# Patient Record
Sex: Male | Born: 1965 | Race: Black or African American | Hispanic: No | Marital: Single | Smoking: Current every day smoker
Health system: Southern US, Community
[De-identification: ages and names within clinical notes are randomized; demographics above are authoritative.]

## PROBLEM LIST (undated history)

## (undated) DIAGNOSIS — M79673 Pain in unspecified foot: Secondary | ICD-10-CM

## (undated) HISTORY — PX: HERNIA REPAIR: SHX51

---

## 2006-03-10 ENCOUNTER — Ambulatory Visit (HOSPITAL_COMMUNITY): Admission: RE | Admit: 2006-03-10 | Discharge: 2006-03-10 | Payer: Self-pay | Admitting: Ophthalmology

## 2008-06-18 ENCOUNTER — Emergency Department (HOSPITAL_COMMUNITY): Admission: EM | Admit: 2008-06-18 | Discharge: 2008-06-18 | Payer: Self-pay | Admitting: Emergency Medicine

## 2008-06-19 ENCOUNTER — Emergency Department (HOSPITAL_COMMUNITY): Admission: EM | Admit: 2008-06-19 | Discharge: 2008-06-20 | Payer: Self-pay | Admitting: Emergency Medicine

## 2010-01-08 ENCOUNTER — Emergency Department (HOSPITAL_COMMUNITY): Admission: EM | Admit: 2010-01-08 | Discharge: 2010-01-09 | Payer: Self-pay | Admitting: Emergency Medicine

## 2010-01-12 ENCOUNTER — Emergency Department (HOSPITAL_COMMUNITY): Admission: EM | Admit: 2010-01-12 | Discharge: 2010-01-12 | Payer: Self-pay | Admitting: Emergency Medicine

## 2010-01-15 ENCOUNTER — Emergency Department (HOSPITAL_COMMUNITY): Admission: EM | Admit: 2010-01-15 | Discharge: 2010-01-15 | Payer: Self-pay | Admitting: Emergency Medicine

## 2010-01-31 ENCOUNTER — Emergency Department (HOSPITAL_COMMUNITY): Admission: EM | Admit: 2010-01-31 | Discharge: 2010-02-01 | Payer: Self-pay | Admitting: Emergency Medicine

## 2010-02-05 ENCOUNTER — Ambulatory Visit: Payer: Self-pay | Admitting: Vascular Surgery

## 2010-02-05 ENCOUNTER — Observation Stay (HOSPITAL_COMMUNITY): Admission: EM | Admit: 2010-02-05 | Discharge: 2010-02-05 | Payer: Self-pay | Admitting: Internal Medicine

## 2010-02-05 ENCOUNTER — Encounter (INDEPENDENT_AMBULATORY_CARE_PROVIDER_SITE_OTHER): Payer: Self-pay | Admitting: Emergency Medicine

## 2010-02-23 ENCOUNTER — Emergency Department (HOSPITAL_COMMUNITY): Admission: EM | Admit: 2010-02-23 | Discharge: 2010-02-24 | Payer: Self-pay | Admitting: Emergency Medicine

## 2010-03-25 ENCOUNTER — Emergency Department (HOSPITAL_COMMUNITY): Admission: EM | Admit: 2010-03-25 | Discharge: 2010-03-25 | Payer: Self-pay | Admitting: Emergency Medicine

## 2010-04-15 ENCOUNTER — Emergency Department (HOSPITAL_COMMUNITY): Admission: EM | Admit: 2010-04-15 | Discharge: 2010-04-15 | Payer: Self-pay | Admitting: Emergency Medicine

## 2010-08-19 ENCOUNTER — Emergency Department (HOSPITAL_COMMUNITY): Admission: EM | Admit: 2010-08-19 | Discharge: 2010-08-19 | Payer: Self-pay | Admitting: Emergency Medicine

## 2010-08-24 ENCOUNTER — Emergency Department (HOSPITAL_COMMUNITY): Admission: EM | Admit: 2010-08-24 | Discharge: 2010-08-25 | Payer: Self-pay | Admitting: Emergency Medicine

## 2010-09-03 ENCOUNTER — Emergency Department (HOSPITAL_COMMUNITY): Admission: EM | Admit: 2010-09-03 | Discharge: 2010-09-03 | Payer: Self-pay | Admitting: Emergency Medicine

## 2010-09-15 ENCOUNTER — Emergency Department (HOSPITAL_COMMUNITY): Admission: EM | Admit: 2010-09-15 | Discharge: 2010-09-16 | Payer: Self-pay | Admitting: Emergency Medicine

## 2010-09-19 ENCOUNTER — Emergency Department (HOSPITAL_COMMUNITY): Admission: EM | Admit: 2010-09-19 | Discharge: 2010-09-19 | Payer: Self-pay | Admitting: Emergency Medicine

## 2010-10-29 ENCOUNTER — Emergency Department (HOSPITAL_COMMUNITY): Admission: EM | Admit: 2010-10-29 | Discharge: 2010-03-31 | Payer: Self-pay | Admitting: Emergency Medicine

## 2010-12-08 ENCOUNTER — Emergency Department (HOSPITAL_COMMUNITY)
Admission: EM | Admit: 2010-12-08 | Discharge: 2010-12-09 | Payer: Self-pay | Source: Home / Self Care | Admitting: Emergency Medicine

## 2010-12-22 ENCOUNTER — Emergency Department (HOSPITAL_COMMUNITY)
Admission: EM | Admit: 2010-12-22 | Discharge: 2010-12-22 | Payer: Self-pay | Source: Home / Self Care | Admitting: Emergency Medicine

## 2011-01-02 ENCOUNTER — Emergency Department (HOSPITAL_COMMUNITY)
Admission: EM | Admit: 2011-01-02 | Discharge: 2011-01-02 | Disposition: A | Discharge status: Home / Self Care | Payer: Self-pay | Attending: Emergency Medicine | Admitting: Emergency Medicine

## 2011-01-02 DIAGNOSIS — Z5900 Homelessness unspecified: Secondary | ICD-10-CM | POA: Insufficient documentation

## 2011-01-02 DIAGNOSIS — R51 Headache: Secondary | ICD-10-CM | POA: Insufficient documentation

## 2011-01-02 DIAGNOSIS — M79609 Pain in unspecified limb: Secondary | ICD-10-CM | POA: Insufficient documentation

## 2011-01-02 DIAGNOSIS — R234 Changes in skin texture: Secondary | ICD-10-CM | POA: Insufficient documentation

## 2011-01-21 ENCOUNTER — Emergency Department (HOSPITAL_COMMUNITY)
Admission: EM | Admit: 2011-01-21 | Discharge: 2011-01-22 | Disposition: A | Discharge status: Home / Self Care | Payer: Self-pay | Attending: Emergency Medicine | Admitting: Emergency Medicine

## 2011-01-21 DIAGNOSIS — M545 Low back pain, unspecified: Secondary | ICD-10-CM | POA: Insufficient documentation

## 2011-01-21 DIAGNOSIS — M79609 Pain in unspecified limb: Secondary | ICD-10-CM | POA: Insufficient documentation

## 2011-01-21 DIAGNOSIS — G8929 Other chronic pain: Secondary | ICD-10-CM | POA: Insufficient documentation

## 2011-01-21 DIAGNOSIS — M25579 Pain in unspecified ankle and joints of unspecified foot: Secondary | ICD-10-CM | POA: Insufficient documentation

## 2011-01-21 DIAGNOSIS — M25569 Pain in unspecified knee: Secondary | ICD-10-CM | POA: Insufficient documentation

## 2011-01-26 ENCOUNTER — Emergency Department (HOSPITAL_COMMUNITY)
Admission: EM | Admit: 2011-01-26 | Discharge: 2011-01-26 | Disposition: A | Discharge status: Home / Self Care | Payer: Self-pay | Attending: Emergency Medicine | Admitting: Emergency Medicine

## 2011-01-26 DIAGNOSIS — Z5900 Homelessness unspecified: Secondary | ICD-10-CM | POA: Insufficient documentation

## 2011-01-26 DIAGNOSIS — M25569 Pain in unspecified knee: Secondary | ICD-10-CM | POA: Insufficient documentation

## 2011-02-04 LAB — BASIC METABOLIC PANEL
BUN: 18 mg/dL (ref 6–23)
CO2: 26 mEq/L (ref 19–32)
Calcium: 8.7 mg/dL (ref 8.4–10.5)
GFR calc non Af Amer: >60 mL/min (ref >60)
Potassium: 3.4 mEq/L — ABNORMAL LOW (ref 3.5–5.1)

## 2011-02-05 ENCOUNTER — Emergency Department (HOSPITAL_COMMUNITY)
Admission: EM | Admit: 2011-02-05 | Discharge: 2011-02-05 | Disposition: A | Discharge status: Home / Self Care | Payer: Self-pay | Attending: Emergency Medicine | Admitting: Emergency Medicine

## 2011-02-05 DIAGNOSIS — Z5900 Homelessness unspecified: Secondary | ICD-10-CM | POA: Insufficient documentation

## 2011-02-05 DIAGNOSIS — M79609 Pain in unspecified limb: Secondary | ICD-10-CM | POA: Insufficient documentation

## 2011-02-05 DIAGNOSIS — G8929 Other chronic pain: Secondary | ICD-10-CM | POA: Insufficient documentation

## 2011-02-10 LAB — CBC
Hemoglobin: 12.7 g/dL — ABNORMAL LOW (ref 13.0–17.0)
MCHC: 33.4 g/dL (ref 30.0–36.0)
MCV: 85.6 fL (ref 78.0–100.0)
Platelets: 233 10*3/uL (ref 150–400)
RDW: 15 % (ref 11.5–15.5)
WBC: 4.2 10*3/uL (ref 4.0–10.5)
WBC: 5.4 10*3/uL (ref 4.0–10.5)

## 2011-02-10 LAB — POCT I-STAT, CHEM 8
Calcium, Ion: 1.16 mmol/L (ref 1.12–1.32)
Chloride: 105 mEq/L (ref 96–112)
Glucose, Bld: 102 mg/dL — ABNORMAL HIGH (ref 70–99)
Potassium: 3.5 mEq/L (ref 3.5–5.1)
Sodium: 140 mEq/L (ref 135–145)
TCO2: 29 mmol/L (ref 0–100)

## 2011-02-10 LAB — HEPATIC FUNCTION PANEL
ALT: 16 U/L (ref 0–53)
Albumin: 4 g/dL (ref 3.5–5.2)
Alkaline Phosphatase: 84 U/L (ref 39–117)
Bilirubin, Direct: 0.1 mg/dL (ref 0.0–0.3)
Total Bilirubin: 0.4 mg/dL (ref 0.3–1.2)

## 2011-02-10 LAB — BASIC METABOLIC PANEL
CO2: 26 mEq/L (ref 19–32)
Creatinine, Ser: 0.91 mg/dL (ref 0.4–1.5)
GFR calc Af Amer: >60 mL/min (ref >60)
GFR calc non Af Amer: >60 mL/min (ref >60)
Glucose, Bld: 93 mg/dL (ref 70–99)

## 2011-02-10 LAB — URINALYSIS, ROUTINE W REFLEX MICROSCOPIC
Bilirubin Urine: NEGATIVE
Glucose, UA: NEGATIVE mg/dL
Ketones, ur: NEGATIVE mg/dL
Nitrite: NEGATIVE
Protein, ur: NEGATIVE mg/dL
Specific Gravity, Urine: 1.018 (ref 1.005–1.030)
pH: 5 (ref 5.0–8.0)

## 2011-02-10 LAB — DIFFERENTIAL
Basophils Absolute: 0 10*3/uL (ref 0.0–0.1)
Basophils Absolute: 0 10*3/uL (ref 0.0–0.1)
Eosinophils Absolute: 0.1 10*3/uL (ref 0.0–0.7)
Eosinophils Relative: 3 % (ref 0–5)
Lymphocytes Relative: 48 % — ABNORMAL HIGH (ref 12–46)
Lymphs Abs: 2 10*3/uL (ref 0.7–4.0)
Monocytes Absolute: 0.5 10*3/uL (ref 0.1–1.0)
Neutro Abs: 1.7 10*3/uL (ref 1.7–7.7)
Neutrophils Relative %: 41 % — ABNORMAL LOW (ref 43–77)

## 2011-02-14 LAB — DIFFERENTIAL
Basophils Relative: 1 % (ref 0–1)
Eosinophils Absolute: 0 10*3/uL (ref 0.0–0.7)
Eosinophils Relative: 1 % (ref 0–5)
Monocytes Absolute: 0.3 10*3/uL (ref 0.1–1.0)
Monocytes Relative: 6 % (ref 3–12)
Neutro Abs: 2.6 10*3/uL (ref 1.7–7.7)

## 2011-02-14 LAB — CBC
Platelets: 251 10*3/uL (ref 150–400)
RBC: 4.22 MIL/uL (ref 4.22–5.81)

## 2011-02-14 LAB — COMPREHENSIVE METABOLIC PANEL
ALT: 12 U/L (ref 0–53)
Chloride: 105 mEq/L (ref 96–112)
Creatinine, Ser: 0.82 mg/dL (ref 0.4–1.5)
Glucose, Bld: 121 mg/dL — ABNORMAL HIGH (ref 70–99)
Potassium: 3.3 mEq/L — ABNORMAL LOW (ref 3.5–5.1)
Sodium: 139 mEq/L (ref 135–145)

## 2011-02-14 LAB — POCT CARDIAC MARKERS
Myoglobin, poc: 63.6 ng/mL (ref 12–200)
Troponin i, poc: <0.05 ng/mL (ref 0.00–0.09)

## 2011-02-14 LAB — BRAIN NATRIURETIC PEPTIDE: Pro B Natriuretic peptide (BNP): <30 pg/mL (ref 0.0–100.0)

## 2011-04-09 NOTE — Op Note (Signed)
Colton Lee, NEE NO.:  [id number]   MEDICAL RECORD NO.:  [id number]          PATIENT TYPE:  AMB   LOCATION:  [facility]                         FACILITY:  [facility]   PHYSICIAN:  Salley Scarlet., M.D.DATE OF BIRTH:  05/31/1966   DATE OF PROCEDURE:  DATE OF DISCHARGE:                                 OPERATIVE REPORT   PREOPERATIVE DIAGNOSIS:  Mature cataract, right eye.   POSTOPERATIVE DIAGNOSIS:  Mature cataract, right eye.   OPERATION PERFORMED:  Kelman phacoemulsification, cataract right eye.   SURGEON:  Nadyne Coombes, M.D.   ANESTHESIA:  Local using Xylocaine 2% and Marcaine 0.75% and Wydase.   INDICATIONS FOR PROCEDURE:  The patient is 45 year old gentleman from  [place] who has recently come to this country.  He gives a history of having  had a cataract extraction from the left eye in [place] several years ago.  He complains of inability to see from the right eye.  He was evaluated and  found to have a visual acuity of hand motions on the right, 20/25 on the  left.  There was a dense white mature cataract on the right with intraocular  lens present on the left.  The patient stated that he needs to try to find a  job but he needs to be able to see better.  Cataract extraction of the right  eye was recommended.  He was admitted at this time for that purpose.   DESCRIPTION OF PROCEDURE:  Under the influence of intravenous sedation and  Van Lint akinesia, retrobulbar anesthesia was given.  The patient was  prepped and draped in the usual manner.  A lid speculum was inserted under  the upper and lower lid of the right eye and a 4-0 silk traction suture was  passed through the belly of the superior rectus muscle for traction.  A  fornix-based conjunctival flap was turned and hemostasis achieved using  cautery.  An incision was made in the sclera at the limbus.  This incision  was dissected down to clear cornea using a crescent blade. A side port  incision was made at the 1:30 o'clock position.  Ocucoat was then injected  into the eye through the side port incision.  The anterior chamber was then  entered through the corneoscleral tunneling incision at the 11:30 o'clock  position.  An anterior capsulotomy was done using a 25 gauge needle. The  nucleus was hydrodissected using Xylocaine.  The KPE handpiece was passed  into the eye and the nucleus was emulsified without difficulty.  The  residual cortical material was aspirated.  The posterior capsule was  polished using the olive tip polisher.  The wound was widened slightly to  accommodate a foldable silicone lens.  This lens was seated into the eye  behind the iris without difficulty.  The anterior chamber was reformed and  the pupil was constricted using Miochol.  The corneoscleral wound was closed  using a single horizontal suture of 10-0 nylon after ascertaining that there  was a small wound leak.  After ascertaining that the wound was secure, the  conjunctiva was folded over the wound using thermal cautery. 1 mL of  Celestone and 0.5 mL of gentamicin were injected subconjunctivally.  Maxitrol ophthalmic ointment and Pilopine ointment were applied along with a  patch and Fox shield. The patient tolerated the procedure well and was  discharged to the post anesthesia recovery room in satisfactory condition.  The  patient was instructed to rest today, to take Tylenol extra strength every  four hours as needed for pain and to see me in the office tomorrow for  further evaluation.   DISCHARGE DIAGNOSIS:  Mature cataract, right eye.      Salley Scarlet., M.D.  Electronically Signed     TB/MEDQ  D:  03/12/2006  T:  03/14/2006  Job:  [id number]

## 2011-05-20 ENCOUNTER — Emergency Department (HOSPITAL_COMMUNITY): Payer: Self-pay

## 2011-05-20 ENCOUNTER — Emergency Department (HOSPITAL_COMMUNITY)
Admission: EM | Admit: 2011-05-20 | Discharge: 2011-05-20 | Disposition: A | Discharge status: Home / Self Care | Payer: Self-pay | Attending: Emergency Medicine | Admitting: Emergency Medicine

## 2011-05-20 ENCOUNTER — Encounter (HOSPITAL_COMMUNITY): Payer: Self-pay | Admitting: Radiology

## 2011-05-20 DIAGNOSIS — R51 Headache: Secondary | ICD-10-CM | POA: Insufficient documentation

## 2011-05-20 DIAGNOSIS — R42 Dizziness and giddiness: Secondary | ICD-10-CM | POA: Insufficient documentation

## 2011-05-20 DIAGNOSIS — R509 Fever, unspecified: Secondary | ICD-10-CM | POA: Insufficient documentation

## 2011-05-20 DIAGNOSIS — IMO0001 Reserved for inherently not codable concepts without codable children: Secondary | ICD-10-CM | POA: Insufficient documentation

## 2011-06-07 ENCOUNTER — Emergency Department (HOSPITAL_COMMUNITY)
Admission: EM | Admit: 2011-06-07 | Discharge: 2011-06-07 | Disposition: A | Discharge status: Home / Self Care | Payer: Self-pay | Attending: Emergency Medicine | Admitting: Emergency Medicine

## 2011-06-07 DIAGNOSIS — M79609 Pain in unspecified limb: Secondary | ICD-10-CM | POA: Insufficient documentation

## 2011-06-07 DIAGNOSIS — B353 Tinea pedis: Secondary | ICD-10-CM | POA: Insufficient documentation

## 2011-07-09 ENCOUNTER — Emergency Department (HOSPITAL_COMMUNITY)
Admission: EM | Admit: 2011-07-09 | Discharge: 2011-07-09 | Disposition: A | Discharge status: Home / Self Care | Payer: Self-pay | Attending: Emergency Medicine | Admitting: Emergency Medicine

## 2011-07-09 DIAGNOSIS — M79609 Pain in unspecified limb: Secondary | ICD-10-CM | POA: Insufficient documentation

## 2011-07-09 DIAGNOSIS — M5412 Radiculopathy, cervical region: Secondary | ICD-10-CM | POA: Insufficient documentation

## 2011-07-13 ENCOUNTER — Emergency Department (HOSPITAL_COMMUNITY)
Admission: EM | Admit: 2011-07-13 | Discharge: 2011-07-14 | Disposition: A | Discharge status: Home / Self Care | Payer: Self-pay | Attending: Emergency Medicine | Admitting: Emergency Medicine

## 2011-07-13 DIAGNOSIS — R209 Unspecified disturbances of skin sensation: Secondary | ICD-10-CM | POA: Insufficient documentation

## 2011-07-13 DIAGNOSIS — M542 Cervicalgia: Secondary | ICD-10-CM | POA: Insufficient documentation

## 2011-07-13 DIAGNOSIS — M5412 Radiculopathy, cervical region: Secondary | ICD-10-CM | POA: Insufficient documentation

## 2011-07-13 DIAGNOSIS — M25519 Pain in unspecified shoulder: Secondary | ICD-10-CM | POA: Insufficient documentation

## 2011-07-13 DIAGNOSIS — M79609 Pain in unspecified limb: Secondary | ICD-10-CM | POA: Insufficient documentation

## 2011-07-14 ENCOUNTER — Emergency Department (HOSPITAL_COMMUNITY): Payer: Self-pay

## 2011-07-26 ENCOUNTER — Emergency Department (HOSPITAL_COMMUNITY)
Admission: EM | Admit: 2011-07-26 | Discharge: 2011-07-26 | Disposition: A | Discharge status: Home / Self Care | Payer: Self-pay | Attending: Emergency Medicine | Admitting: Emergency Medicine

## 2011-07-26 ENCOUNTER — Emergency Department (HOSPITAL_COMMUNITY): Payer: Self-pay

## 2011-07-26 DIAGNOSIS — M25519 Pain in unspecified shoulder: Secondary | ICD-10-CM | POA: Insufficient documentation

## 2011-07-26 DIAGNOSIS — M79609 Pain in unspecified limb: Secondary | ICD-10-CM | POA: Insufficient documentation

## 2011-07-26 DIAGNOSIS — M5412 Radiculopathy, cervical region: Secondary | ICD-10-CM | POA: Insufficient documentation

## 2011-07-26 DIAGNOSIS — R209 Unspecified disturbances of skin sensation: Secondary | ICD-10-CM | POA: Insufficient documentation

## 2011-07-26 DIAGNOSIS — R29898 Other symptoms and signs involving the musculoskeletal system: Secondary | ICD-10-CM | POA: Insufficient documentation

## 2011-07-26 LAB — CBC
MCH: 26.5 pg (ref 26.0–34.0)
MCV: 81.5 fL (ref 78.0–100.0)
Platelets: 222 10*3/uL (ref 150–400)

## 2011-07-26 LAB — DIFFERENTIAL
Basophils Absolute: 0 10*3/uL (ref 0.0–0.1)
Eosinophils Relative: 3 % (ref 0–5)
Lymphocytes Relative: 29 % (ref 12–46)
Lymphs Abs: 1.5 10*3/uL (ref 0.7–4.0)
Monocytes Absolute: 0.4 10*3/uL (ref 0.1–1.0)
Monocytes Relative: 7 % (ref 3–12)
Neutro Abs: 3.1 10*3/uL (ref 1.7–7.7)

## 2011-07-26 LAB — SEDIMENTATION RATE: Sed Rate: 6 mm/hr (ref 0–16)

## 2011-08-20 LAB — DIFFERENTIAL
Basophils Absolute: 0.1
Basophils Relative: 1
Lymphocytes Relative: 8 — ABNORMAL LOW
Monocytes Relative: 4
Neutro Abs: 8.5 — ABNORMAL HIGH
Neutrophils Relative %: 85 — ABNORMAL HIGH

## 2011-08-20 LAB — POCT I-STAT, CHEM 8
Chloride: 104
HCT: 41
Potassium: 3.5

## 2011-08-20 LAB — CBC
MCHC: 33.6
RBC: 4.71
RDW: 15.4

## 2011-08-20 LAB — URINALYSIS, ROUTINE W REFLEX MICROSCOPIC
Nitrite: NEGATIVE
Specific Gravity, Urine: 1.014
pH: 6

## 2011-12-16 ENCOUNTER — Encounter (HOSPITAL_COMMUNITY): Payer: Self-pay | Admitting: Emergency Medicine

## 2011-12-16 ENCOUNTER — Emergency Department (HOSPITAL_COMMUNITY)
Admission: EM | Admit: 2011-12-16 | Discharge: 2011-12-17 | Disposition: A | Discharge status: Home / Self Care | Payer: Self-pay | Attending: Emergency Medicine | Admitting: Emergency Medicine

## 2011-12-16 DIAGNOSIS — L853 Xerosis cutis: Secondary | ICD-10-CM

## 2011-12-16 DIAGNOSIS — Z5900 Homelessness unspecified: Secondary | ICD-10-CM | POA: Insufficient documentation

## 2011-12-16 DIAGNOSIS — R238 Other skin changes: Secondary | ICD-10-CM | POA: Insufficient documentation

## 2011-12-16 DIAGNOSIS — M79609 Pain in unspecified limb: Secondary | ICD-10-CM | POA: Insufficient documentation

## 2011-12-16 NOTE — ED Provider Notes (Signed)
History     CSN: [id number]  Arrival date & time 12/16/11  2323   First MD Initiated Contact with Patient 12/16/11 2340      Chief Complaint  Patient presents with  . Foot Pain    (Consider location/radiation/quality/duration/timing/severity/associated sxs/prior treatment) HPI  Pt presents to the ED with complaints of feet pain. Pt is homeless and is up on his feet all day. He states he walked for hours to get here tonight. He is requesting snacks, some coffee, some socks and medicine for his feet. Pt has not had fevers, chills, N/V/D. Pt says it is very cold outside, pt is not in any distress.  History reviewed. No pertinent past medical history.  Past Surgical History  Procedure Date  . Hernia repair     No family history on file.  History  Substance Use Topics  . Smoking status: Not on file  . Smokeless tobacco: Not on file  . Alcohol Use:       Review of Systems  All other systems reviewed and are negative.    Allergies  Tetanus toxoids  Home Medications   Current Outpatient Rx  Name Route Sig Dispense Refill  . CLOTRIMAZOLE 1 % EX CREA  Apply to affected area 2 times daily 15 g 0    BP 148/88  Pulse 70  Temp 97.7 F (36.5 C)  Resp 16  SpO2 100%  Physical Exam  Nursing note and vitals reviewed. Constitutional: He appears well-developed and well-nourished.  HENT:  Head: Normocephalic and atraumatic.  Eyes: Pupils are equal, round, and reactive to light.  Neck: Normal range of motion.  Cardiovascular: Normal rate and regular rhythm.   Pulmonary/Chest: Effort normal.  Musculoskeletal: Normal range of motion.  Neurological: He is alert.  Skin: Skin is warm and dry.       ED Course  Procedures (including critical care time)  Labs Reviewed - No data to display No results found.   1. Dry skin       MDM  Pt given Rx for Clotrimazole for fungal infection. Pt given some hydrocortisone cream in ED and a small bottle of  lotion.        Dorthula Matas, PA 12/17/11 [phone]

## 2011-12-16 NOTE — ED Notes (Signed)
Pt alert, presents to ED, c/o bilateral feet pain, pt homeless, not wearing socks, states was on antibiotics, no s/s of infection noted

## 2011-12-17 MED ORDER — CLOTRIMAZOLE 1 % EX CREA: TOPICAL_CREAM | CUTANEOUS | Status: DC

## 2011-12-17 MED ORDER — IBUPROFEN 800 MG PO TABS
800.0000 mg | ORAL_TABLET | Freq: Once | ORAL | Status: AC
  Administered 2011-12-17: 800 mg via ORAL
  Filled 2011-12-17: qty 1

## 2011-12-17 MED ORDER — HYDROCORTISONE 1 % EX LOTN: TOPICAL_LOTION | Freq: Once | CUTANEOUS | Status: DC

## 2011-12-17 MED ORDER — HYDROCORTISONE 1 % EX CREA
TOPICAL_CREAM | Freq: Once | CUTANEOUS | Status: AC
  Administered 2011-12-17: via TOPICAL
  Filled 2011-12-17: qty 28

## 2011-12-17 MED ORDER — ACETAMINOPHEN 325 MG PO TABS
650.0000 mg | ORAL_TABLET | Freq: Once | ORAL | Status: AC
  Administered 2011-12-17: 650 mg via ORAL
  Filled 2011-12-17: qty 2

## 2011-12-17 NOTE — ED Provider Notes (Signed)
Medical screening examination/treatment/procedure(s) were performed by non-physician practitioner and as supervising physician I was immediately available for consultation/collaboration.   Vida Roller, MD 12/17/11 [phone]

## 2012-01-07 ENCOUNTER — Emergency Department (HOSPITAL_COMMUNITY): Payer: Self-pay

## 2012-01-07 ENCOUNTER — Emergency Department (HOSPITAL_COMMUNITY)
Admission: EM | Admit: 2012-01-07 | Discharge: 2012-01-07 | Disposition: A | Discharge status: Home / Self Care | Payer: Self-pay | Attending: Emergency Medicine | Admitting: Emergency Medicine

## 2012-01-07 ENCOUNTER — Encounter (HOSPITAL_COMMUNITY): Payer: Self-pay | Admitting: Emergency Medicine

## 2012-01-07 DIAGNOSIS — R059 Cough, unspecified: Secondary | ICD-10-CM | POA: Insufficient documentation

## 2012-01-07 DIAGNOSIS — R6883 Chills (without fever): Secondary | ICD-10-CM | POA: Insufficient documentation

## 2012-01-07 DIAGNOSIS — J069 Acute upper respiratory infection, unspecified: Secondary | ICD-10-CM | POA: Insufficient documentation

## 2012-01-07 DIAGNOSIS — R05 Cough: Secondary | ICD-10-CM | POA: Insufficient documentation

## 2012-01-07 DIAGNOSIS — J3489 Other specified disorders of nose and nasal sinuses: Secondary | ICD-10-CM | POA: Insufficient documentation

## 2012-01-07 MED ORDER — AMOXICILLIN 500 MG PO CAPS: 500.0000 mg | ORAL_CAPSULE | Freq: Three times a day (TID) | ORAL | Status: AC

## 2012-01-07 NOTE — ED Notes (Signed)
[id number]<[id number]> Expected date:<BR> Expected time:<BR> Means of arrival:<BR> Comments:<BR> Triage One, Cough/Cold S/S

## 2012-01-07 NOTE — ED Notes (Signed)
MD at bedside. Dr. Delo at bedside.  

## 2012-01-07 NOTE — ED Notes (Signed)
Pt alert, nad, c/o cold s/s, pt is homeless, resp even unlabored, skin pwd

## 2012-01-07 NOTE — ED Provider Notes (Signed)
History     CSN: [id number]  Arrival date & time 01/07/12  0424   First MD Initiated Contact with Patient 01/07/12 [phone]      Chief Complaint  Patient presents with  . Cough  . Nasal Congestion  . URI    (Consider location/radiation/quality/duration/timing/severity/associated sxs/prior treatment) HPI Comments: Chest congestion for several days.  It sounds as though this patient is homeless.  He is not the best historian and is somewhat difficult to comprehend.  He believes the cold air is getting to him.  Patient is a 46 y.o. male presenting with cough and URI. The history is provided by the patient.  Cough This is a new problem. The current episode started more than 2 days ago. The problem occurs constantly. The problem has not changed since onset.The cough is non-productive. There has been no fever. Associated symptoms include chills. Pertinent negatives include no chest pain and no wheezing. He has tried nothing for the symptoms. He is a smoker.  URI The primary symptoms include cough. Primary symptoms do not include wheezing.  Symptoms associated with the illness include chills.    History reviewed. No pertinent past medical history.  Past Surgical History  Procedure Date  . Hernia repair     No family history on file.  History  Substance Use Topics  . Smoking status: Not on file  . Smokeless tobacco: Not on file  . Alcohol Use:       Review of Systems  Constitutional: Positive for chills.  Respiratory: Positive for cough. Negative for wheezing.   Cardiovascular: Negative for chest pain.  All other systems reviewed and are negative.    Allergies  Tetanus toxoids  Home Medications   Current Outpatient Rx  Name Route Sig Dispense Refill  . CLOTRIMAZOLE 1 % EX CREA  Apply to affected area 2 times daily 15 g 0    BP 162/81  Pulse 87  Temp(Src) 98.6 F (37 C) (Oral)  Resp 16  SpO2 100%  Physical Exam  Nursing note and vitals  reviewed. Constitutional: He is oriented to person, place, and time. He appears well-developed and well-nourished. No distress.  HENT:  Head: Normocephalic and atraumatic.  Mouth/Throat: Oropharynx is clear and moist. No oropharyngeal exudate.  Neck: Normal range of motion. Neck supple.  Cardiovascular: Normal rate and regular rhythm.   No murmur heard. Pulmonary/Chest: Effort normal and breath sounds normal. No respiratory distress. He has no wheezes.  Abdominal: Soft. Bowel sounds are normal. He exhibits no distension. There is no tenderness.  Musculoskeletal: Normal range of motion. He exhibits no edema.  Neurological: He is alert and oriented to person, place, and time.  Skin: Skin is warm and dry. He is not diaphoretic.    ED Course  Procedures (including critical care time)  Labs Reviewed - No data to display No results found.   No diagnosis found.    MDM  The xray looks okay.  Will discharge with antibiotics.  Return prn.        Geoffery Lyons, MD 01/07/12 [phone]

## 2012-01-29 ENCOUNTER — Emergency Department (HOSPITAL_COMMUNITY)
Admission: EM | Admit: 2012-01-29 | Discharge: 2012-01-29 | Disposition: A | Discharge status: Home / Self Care | Payer: Self-pay | Attending: Emergency Medicine | Admitting: Emergency Medicine

## 2012-01-29 ENCOUNTER — Encounter (HOSPITAL_COMMUNITY): Payer: Self-pay | Admitting: Emergency Medicine

## 2012-01-29 DIAGNOSIS — R42 Dizziness and giddiness: Secondary | ICD-10-CM | POA: Insufficient documentation

## 2012-01-29 DIAGNOSIS — Z765 Malingerer [conscious simulation]: Secondary | ICD-10-CM | POA: Insufficient documentation

## 2012-01-29 DIAGNOSIS — F17200 Nicotine dependence, unspecified, uncomplicated: Secondary | ICD-10-CM | POA: Insufficient documentation

## 2012-01-29 DIAGNOSIS — Z5900 Homelessness unspecified: Secondary | ICD-10-CM | POA: Insufficient documentation

## 2012-01-29 DIAGNOSIS — M79609 Pain in unspecified limb: Secondary | ICD-10-CM | POA: Insufficient documentation

## 2012-01-29 DIAGNOSIS — IMO0001 Reserved for inherently not codable concepts without codable children: Secondary | ICD-10-CM | POA: Insufficient documentation

## 2012-01-29 LAB — GLUCOSE, CAPILLARY: Glucose-Capillary: 132 mg/dL — ABNORMAL HIGH (ref 70–99)

## 2012-01-29 NOTE — ED Notes (Signed)
Pt states he is in-between residence right now and has been "out in the weather" which is why he feels like he has flu like symptoms. Pt c/o of chills. Pt denies n/v/d. Pt states he has been experiencing fevers but is unable to tell me exactly how high his temperature has been. During assessment pt now sleeping with snoring respirations. Pt is arousable. Pt appears to be in no apparent distress. Will continue to monitor

## 2012-01-29 NOTE — ED Provider Notes (Signed)
Medical screening examination/treatment/procedure(s) were performed by non-physician practitioner and as supervising physician I was immediately available for consultation/collaboration.   Hy Swiatek L Favian Kittleson, MD 01/29/12 0611 

## 2012-01-29 NOTE — ED Provider Notes (Signed)
History     CSN: [id number]  Arrival date & time 01/29/12  4098   First MD Initiated Contact with Patient 01/29/12 [phone]      Chief Complaint  Patient presents with  . Influenza    (Consider location/radiation/quality/duration/timing/severity/associated sxs/prior treatment) HPI Comments: Patient is homeless.  He was here on February 15 complaining of myalgias and a negative.  Chest x-ray, but he was given an antibiotic for his myalgias, which she did not fill.  He states he has not eaten in several days.  He does not go to any shelters.  He has not been to any of the suggestions and now his feet hurt, because he's been walking, so much  Patient is a 46 y.o. male presenting with flu symptoms. The history is provided by the patient.  Influenza The problem occurs constantly. Associated symptoms include myalgias. Pertinent negatives include no chills, congestion, fever or weakness.    History reviewed. No pertinent past medical history.  Past Surgical History  Procedure Date  . Hernia repair     History reviewed. No pertinent family history.  History  Substance Use Topics  . Smoking status: Current Everyday Smoker  . Smokeless tobacco: Not on file  . Alcohol Use: No      Review of Systems  Constitutional: Negative for fever and chills.  HENT: Negative for congestion and rhinorrhea.   Gastrointestinal: Negative for diarrhea.  Genitourinary: Negative for dysuria.  Musculoskeletal: Positive for myalgias.  Skin: Negative for wound.  Neurological: Positive for dizziness. Negative for weakness.    Allergies  Tetanus toxoids  Home Medications  No current outpatient prescriptions on file.  BP 129/56  Pulse 58  Temp(Src) 97.7 F (36.5 C) (Oral)  Resp 18  SpO2 98%  Physical Exam  Constitutional: He is oriented to person, place, and time. He appears well-developed and well-nourished.  HENT:  Head: Normocephalic.  Neck: Normal range of motion.  Cardiovascular: Normal  rate.   Pulmonary/Chest: Effort normal.  Abdominal: Soft.  Neurological: He is alert and oriented to person, place, and time.  Skin: Skin is warm.    ED Course  Procedures (including critical care time)  Labs Reviewed  GLUCOSE, CAPILLARY - Abnormal; Notable for the following:    Glucose-Capillary 132 (*)    All other components within normal limits   No results found.   1. Malingerer       MDM  Patient is homeless or warm, place to stay in a meal.  He really has no specific complaints except that his feet hurt from walking        Arman Filter, NP 01/29/12 0546  Arman Filter, NP 01/29/12 [phone]

## 2012-01-29 NOTE — ED Notes (Signed)
Pt states he has not felt well for several weeks  Pt states he has had chills, fever, neck pain, general body aches Denies N/V/D or cough

## 2012-03-21 ENCOUNTER — Emergency Department (HOSPITAL_COMMUNITY)
Admission: EM | Admit: 2012-03-21 | Discharge: 2012-03-21 | Disposition: A | Discharge status: Home / Self Care | Payer: Self-pay | Attending: Emergency Medicine | Admitting: Emergency Medicine

## 2012-03-21 DIAGNOSIS — B353 Tinea pedis: Secondary | ICD-10-CM | POA: Insufficient documentation

## 2012-03-21 DIAGNOSIS — M79609 Pain in unspecified limb: Secondary | ICD-10-CM | POA: Insufficient documentation

## 2012-03-21 DIAGNOSIS — F17200 Nicotine dependence, unspecified, uncomplicated: Secondary | ICD-10-CM | POA: Insufficient documentation

## 2012-03-21 MED ORDER — NYSTATIN 100000 UNIT/GM EX POWD
Freq: Once | CUTANEOUS | Status: AC
  Administered 2012-03-21: 10:00:00 via TOPICAL
  Filled 2012-03-21: qty 15

## 2012-03-21 MED ORDER — TOLNAFTATE 1 % EX POWD
Freq: Two times a day (BID) | CUTANEOUS | Status: DC
  Filled 2012-03-21: qty 45

## 2012-03-21 NOTE — Progress Notes (Signed)
Pt listed as self pay with no insurance coverage Pt confirms he is self pay [place] resident CM and [facility] community liaison spoke with him Pt offered [facility] services to assist with finding a [place] self pay provider Pt accepted information   

## 2012-03-21 NOTE — Discharge Instructions (Signed)
Athlete's Foot Athlete's foot (tinea pedis) is a fungal infection of the skin on the feet. It often occurs on the skin between the toes or underneath the toes. It can also occur on the soles of the feet. Athlete's foot is more likely to occur in hot, humid weather. Not washing your feet or changing your socks often enough can contribute to athlete's foot. The infection can spread from person to person (contagious). CAUSES Athlete's foot is caused by a fungus. This fungus thrives in warm, moist places. Most people get athlete's foot by sharing shower stalls, towels, and wet floors with an infected person. People with weakened immune systems, including those with diabetes, may be more likely to get athlete's foot. SYMPTOMS   Itchy areas between the toes or on the soles of the feet.   White, flaky, or scaly areas between the toes or on the soles of the feet.   Tiny, intensely itchy blisters between the toes or on the soles of the feet.   Tiny cuts on the skin. These cuts can develop a bacterial infection.   Thick or discolored toenails.  DIAGNOSIS  Your caregiver can usually tell what the problem is by doing a physical exam. Your caregiver may also take a skin sample from the rash area. The skin sample may be examined under a microscope, or it may be tested to see if fungus will grow in the sample. A sample may also be taken from your toenail for testing. TREATMENT  Over-the-counter and prescription medicines can be used to kill the fungus. These medicines are available as powders or creams. Your caregiver can suggest medicines for you. Fungal infections respond slowly to treatment. You may need to continue using your medicine for several weeks. PREVENTION   Do not share towels.   Wear sandals in wet areas, such as shared locker rooms and shared showers.   Keep your feet dry. Wear shoes that allow air to circulate. Wear cotton or wool socks.  HOME CARE INSTRUCTIONS   Take medicines as  directed by your caregiver. Do not use steroid creams on athlete's foot.   Keep your feet clean and cool. Wash your feet daily and dry them thoroughly, especially between your toes.   Change your socks every day. Wear cotton or wool socks. In hot climates, you may need to change your socks 2 to 3 times per day.   Wear sandals or canvas tennis shoes with good air circulation.   If you have blisters, soak your feet in Burow's solution or Epsom salts for 20 to 30 minutes, 2 times a day to dry out the blisters. Make sure you dry your feet thoroughly afterward.  SEEK MEDICAL CARE IF:   You have a fever.   You have swelling, soreness, warmth, or redness in your foot.   You are not getting better after 7 days of treatment.   You are not completely cured after 30 days.   You have any problems caused by your medicines.  MAKE SURE YOU:   Understand these instructions.   Will watch your condition.   Will get help right away if you are not doing well or get worse.  Document Released: 11/05/2000 Document Revised: 10/28/2011 Document Reviewed: 08/27/2011 [facility] Patient Information 2012 [place], [place].Athlete's Foot Athlete's foot (tinea pedis) is a fungal infection of the skin on the feet. It often occurs on the skin between the toes or underneath the toes. It can also occur on the soles of the feet.  Athlete's foot is more likely to occur in hot, humid weather. Not washing your feet or changing your socks often enough can contribute to athlete's foot. The infection can spread from person to person (contagious). CAUSES Athlete's foot is caused by a fungus. This fungus thrives in warm, moist places. Most people get athlete's foot by sharing shower stalls, towels, and wet floors with an infected person. People with weakened immune systems, including those with diabetes, may be more likely to get athlete's foot. SYMPTOMS   Itchy areas between the toes or on the soles of the feet.   White, flaky,  or scaly areas between the toes or on the soles of the feet.   Tiny, intensely itchy blisters between the toes or on the soles of the feet.   Tiny cuts on the skin. These cuts can develop a bacterial infection.   Thick or discolored toenails.  DIAGNOSIS  Your caregiver can usually tell what the problem is by doing a physical exam. Your caregiver may also take a skin sample from the rash area. The skin sample may be examined under a microscope, or it may be tested to see if fungus will grow in the sample. A sample may also be taken from your toenail for testing. TREATMENT  Over-the-counter and prescription medicines can be used to kill the fungus. These medicines are available as powders or creams. Your caregiver can suggest medicines for you. Fungal infections respond slowly to treatment. You may need to continue using your medicine for several weeks. PREVENTION   Do not share towels.   Wear sandals in wet areas, such as shared locker rooms and shared showers.   Keep your feet dry. Wear shoes that allow air to circulate. Wear cotton or wool socks.  HOME CARE INSTRUCTIONS   Take medicines as directed by your caregiver. Do not use steroid creams on athlete's foot.   Keep your feet clean and cool. Wash your feet daily and dry them thoroughly, especially between your toes.   Change your socks every day. Wear cotton or wool socks. In hot climates, you may need to change your socks 2 to 3 times per day.   Wear sandals or canvas tennis shoes with good air circulation.   If you have blisters, soak your feet in Burow's solution or Epsom salts for 20 to 30 minutes, 2 times a day to dry out the blisters. Make sure you dry your feet thoroughly afterward.  SEEK MEDICAL CARE IF:   You have a fever.   You have swelling, soreness, warmth, or redness in your foot.   You are not getting better after 7 days of treatment.   You are not completely cured after 30 days.   You have any problems caused  by your medicines.  MAKE SURE YOU:   Understand these instructions.   Will watch your condition.   Will get help right away if you are not doing well or get worse.  Document Released: 11/05/2000 Document Revised: 10/28/2011 Document Reviewed: 08/27/2011 [facility] Patient Information 2012 [place], [place].

## 2012-03-21 NOTE — ED Notes (Signed)
[id number]<AV> Expected date:<BR> Expected time:<BR> Means of arrival:<BR> Comments:<BR> EMS/ bilateral foot pain
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
## 2012-03-25 ENCOUNTER — Encounter (HOSPITAL_COMMUNITY): Payer: Self-pay | Admitting: Family Medicine

## 2012-03-25 ENCOUNTER — Emergency Department (HOSPITAL_COMMUNITY)
Admission: EM | Admit: 2012-03-25 | Discharge: 2012-03-26 | Disposition: A | Discharge status: Home / Self Care | Payer: Self-pay | Attending: Emergency Medicine | Admitting: Emergency Medicine

## 2012-03-25 ENCOUNTER — Encounter (HOSPITAL_COMMUNITY): Payer: Self-pay | Admitting: *Deleted

## 2012-03-25 ENCOUNTER — Emergency Department (HOSPITAL_COMMUNITY)
Admission: EM | Admit: 2012-03-25 | Discharge: 2012-03-25 | Disposition: A | Discharge status: Home / Self Care | Payer: Self-pay | Attending: Emergency Medicine | Admitting: Emergency Medicine

## 2012-03-25 DIAGNOSIS — F17200 Nicotine dependence, unspecified, uncomplicated: Secondary | ICD-10-CM | POA: Insufficient documentation

## 2012-03-25 DIAGNOSIS — L851 Acquired keratosis [keratoderma] palmaris et plantaris: Secondary | ICD-10-CM | POA: Insufficient documentation

## 2012-03-25 DIAGNOSIS — Z5900 Homelessness unspecified: Secondary | ICD-10-CM | POA: Insufficient documentation

## 2012-03-25 DIAGNOSIS — L853 Xerosis cutis: Secondary | ICD-10-CM

## 2012-03-25 DIAGNOSIS — M7989 Other specified soft tissue disorders: Secondary | ICD-10-CM | POA: Insufficient documentation

## 2012-03-25 DIAGNOSIS — R21 Rash and other nonspecific skin eruption: Secondary | ICD-10-CM | POA: Insufficient documentation

## 2012-03-25 DIAGNOSIS — M79609 Pain in unspecified limb: Secondary | ICD-10-CM | POA: Insufficient documentation

## 2012-03-25 DIAGNOSIS — M791 Myalgia, unspecified site: Secondary | ICD-10-CM

## 2012-03-25 DIAGNOSIS — G8929 Other chronic pain: Secondary | ICD-10-CM

## 2012-03-25 DIAGNOSIS — IMO0001 Reserved for inherently not codable concepts without codable children: Secondary | ICD-10-CM | POA: Insufficient documentation

## 2012-03-25 HISTORY — DX: Pain in unspecified foot: M79.673

## 2012-03-25 NOTE — ED Provider Notes (Signed)
Medical screening examination/treatment/procedure(s) were performed by non-physician practitioner and as supervising physician I was immediately available for consultation/collaboration.  Olivia Mackie, MD 03/25/12 [phone]

## 2012-03-25 NOTE — ED Notes (Signed)
"   pt states he doesn't have a residence"

## 2012-03-25 NOTE — ED Notes (Signed)
"   foot pain since 2011"
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
## 2012-03-25 NOTE — ED Notes (Signed)
Patient states "I am here for my feet problem." Reports it as athlete's foot.

## 2012-03-25 NOTE — ED Notes (Signed)
PA Schulz at bedside. 

## 2012-03-25 NOTE — Discharge Instructions (Signed)
U. feet cleaned up well and have dry skin and calluses.  He can use a cornstarch powder on your feet, and between her toes on a daily basis.  Please try to wash her feet daily, as well as wash her socks.  You have been provided with extra socks to allow this

## 2012-03-25 NOTE — ED Notes (Signed)
Pt given clean socks. Skin intact to feet after soaking . Pt states his feet feel better.

## 2012-03-25 NOTE — ED Provider Notes (Signed)
History     CSN: [id number]  Arrival date & time 03/25/12  4782   First MD Initiated Contact with Patient 03/25/12 0403      Chief Complaint  Patient presents with  . Foot Pain    (Consider location/radiation/quality/duration/timing/severity/associated sxs/prior treatment) HPI Comments: The patient is homeless and walks a lot has chronic athlete's foot coming in complaining is "feet hurt."  Patient is a 46 y.o. male presenting with lower extremity pain. The history is provided by the patient.  Foot Pain This is a chronic problem. The problem occurs constantly. The problem has been unchanged. Associated symptoms include a rash. Pertinent negatives include no fever or myalgias.    Past Medical History  Diagnosis Date  . Foot pain     Past Surgical History  Procedure Date  . Hernia repair     History reviewed. No pertinent family history.  History  Substance Use Topics  . Smoking status: Current Everyday Smoker  . Smokeless tobacco: Not on file  . Alcohol Use: No      Review of Systems  Constitutional: Negative for fever.  Musculoskeletal: Negative for myalgias and gait problem.  Skin: Positive for rash. Negative for wound.    Allergies  Tetanus toxoids  Home Medications  No current outpatient prescriptions on file.  BP 170/115  Pulse 62  Temp(Src) 97.6 F (36.4 C) (Oral)  SpO2 100%  Physical Exam  Constitutional: He appears well-developed and well-nourished.  HENT:  Head: Normocephalic.  Eyes: Pupils are equal, round, and reactive to light.  Neck: Normal range of motion.  Cardiovascular: Normal rate.   Pulmonary/Chest: Effort normal.  Musculoskeletal: He exhibits no edema.  Skin:       Skin between toes on the left foot appears thickened and cracked in several areas, but it's hard to ascertain exact extent due cake powder the patient placed on his foot Right foot does not appear to be affected, although he does have some dried skin on the bottom of  his feet    ED Course  Procedures (including critical care time)  Labs Reviewed - No data to display No results found.   1. Dry skin       MDM  Dry skin        Arman Filter, NP 03/25/12 0522

## 2012-03-26 ENCOUNTER — Emergency Department (HOSPITAL_COMMUNITY)
Admission: EM | Admit: 2012-03-26 | Discharge: 2012-03-26 | Disposition: A | Discharge status: Home / Self Care | Payer: Self-pay | Attending: Emergency Medicine | Admitting: Emergency Medicine

## 2012-03-26 ENCOUNTER — Encounter (HOSPITAL_COMMUNITY): Payer: Self-pay

## 2012-03-26 DIAGNOSIS — F17200 Nicotine dependence, unspecified, uncomplicated: Secondary | ICD-10-CM | POA: Insufficient documentation

## 2012-03-26 DIAGNOSIS — B351 Tinea unguium: Secondary | ICD-10-CM

## 2012-03-26 DIAGNOSIS — M79609 Pain in unspecified limb: Secondary | ICD-10-CM | POA: Insufficient documentation

## 2012-03-26 DIAGNOSIS — L0889 Other specified local infections of the skin and subcutaneous tissue: Secondary | ICD-10-CM | POA: Insufficient documentation

## 2012-03-26 DIAGNOSIS — G8929 Other chronic pain: Secondary | ICD-10-CM | POA: Insufficient documentation

## 2012-03-26 DIAGNOSIS — M79673 Pain in unspecified foot: Secondary | ICD-10-CM
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
## 2012-03-26 NOTE — ED Provider Notes (Signed)
Medical screening examination/treatment/procedure(s) were performed by non-physician practitioner and as supervising physician I was immediately available for consultation/collaboration.    Nur Krasinski R Ginni Eichler, MD 03/26/12 1758 

## 2012-03-26 NOTE — Discharge Instructions (Signed)
FOLLOW UP WITH A PRIMARY CARE PHYSICIAN OF YOUR CHOICE FOR FURTHER MANAGEMENT OF CHRONIC FOOT PAIN AND SWELLING.  RECOMMEND USE OF TINACTIN FOR FOOT TREATMENT.

## 2012-03-26 NOTE — ED Provider Notes (Signed)
Medical screening examination/treatment/procedure(s) were performed by non-physician practitioner and as supervising physician I was immediately available for consultation/collaboration.   Minaal Struckman, MD 03/26/12 0727 

## 2012-03-26 NOTE — ED Notes (Signed)
Coffee, peanut butter and crackers and bus pass given

## 2012-03-26 NOTE — ED Notes (Signed)
Pt presents with no acute distress.  Pt c/o of problems to feet for 3 years and generalized body aches for a while.  Denies NVD and fever
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
## 2012-03-26 NOTE — Discharge Instructions (Signed)
Please read and follow all provided instructions.  Your diagnoses today include:  1. Foot pain   2. Fungal toenail infection     Tests performed today include:  Vital signs. See below for your results today.   Medications prescribed:   None  Home care instructions:  Follow any educational materials contained in this packet.  Use over-the-counter pain medication as directed on the packaging. You may also try over-the-counter medicines for your nail infections.   Follow-up instructions: Please follow-up with your primary care provider in the next 3 days for further evaluation of your symptoms.   If you do not have a primary care doctor -- see below for referral information.   Return instructions:   Please return to the Emergency Department if you experience worsening symptoms.   Please return if you have any other emergent concerns.  Additional Information:  Your vital signs today were: Pulse 88  Temp(Src) 97.6 F (36.4 C) (Oral)  Resp 18  SpO2 100% If your blood pressure (BP) was elevated above 135/85 this visit, please have this repeated by your doctor within one month. -------------- No Primary Care Doctor Call [facility]  [phone] Other agencies that provide inexpensive medical care    [facility]  [phone]    [facility]  [phone]    [facility]  [phone]    [facility]  [phone]    [facility]  [phone]    [facility]  [phone] -------------- RESOURCE GUIDE:  Dental Problems  Patients with Medicaid: [facility] [phone] [address]                                            [phone] [address] Phone:  [phone]                                                   Phone:  [phone]  If unable to pay or uninsured, contact:  [facility] or [facility]. to become qualified for the adult dental clinic.  Chronic Pain  Problems Contact [facility]  [phone] Patients need to be referred by their primary care doctor.  Insufficient Money for Medicine Contact [employer]:  call "211" or [facility] [phone].  Psychological Services [facility]  [phone] [facility]  [phone] [facility]   [phone] (emergency services [phone])  Substance Abuse Resources [facility]  ([phone] [facility] 2094317172 [facility] [phone] Floydene Flock [phone] Residential & Outpatient Substance Abuse Program  [phone]  Abuse/Neglect [facility] Child Abuse Hotline 704-430-8613 [facility] Child Abuse Hotline 646-281-3041 (After Hours)  Emergency Shelter [facility] 2191216155  M[facility] [place] [facility] [phone] [facility] [phone]  [facility] Resources  [facility]     [employer]                          [facility] Dept.  [address] [place]                       [address]      [address]  Blondell Reveal Phone:  [phone]                                   Phone:  [phone]                 Phone:  [phone]  [facility] Phone:  [phone]  [facility] Child Abuse Hotline (660)063-1208 [phone] (After Hours)

## 2012-03-26 NOTE — ED Notes (Signed)
Pt in from home with c/o bilateral foot pain states ongoing for 3 years denies recent injury pt states pain is mostly great toes

## 2012-04-28 ENCOUNTER — Emergency Department (HOSPITAL_COMMUNITY)
Admission: EM | Admit: 2012-04-28 | Discharge: 2012-04-28 | Disposition: A | Discharge status: Home / Self Care | Payer: Self-pay | Attending: Emergency Medicine | Admitting: Emergency Medicine

## 2012-04-28 ENCOUNTER — Encounter (HOSPITAL_COMMUNITY): Payer: Self-pay | Admitting: *Deleted

## 2012-04-28 DIAGNOSIS — F17200 Nicotine dependence, unspecified, uncomplicated: Secondary | ICD-10-CM | POA: Insufficient documentation

## 2012-04-28 DIAGNOSIS — B353 Tinea pedis: Secondary | ICD-10-CM

## 2012-04-28 DIAGNOSIS — M79609 Pain in unspecified limb: Secondary | ICD-10-CM | POA: Insufficient documentation

## 2012-04-28 DIAGNOSIS — M79673 Pain in unspecified foot: Secondary | ICD-10-CM

## 2012-04-28 DIAGNOSIS — G8929 Other chronic pain: Secondary | ICD-10-CM | POA: Insufficient documentation

## 2012-04-28 MED ORDER — NYSTATIN 100000 UNIT/GM EX POWD
Freq: Once | CUTANEOUS | Status: AC
  Administered 2012-04-28: 04:00:00 via TOPICAL
  Filled 2012-04-28: qty 15

## 2012-04-28 NOTE — ED Notes (Signed)
Pt talking to himself and responding to internal stimuli. Pt paranoid of staff stating "I am in [place] not [place], you do not tell me what to do. Do you know what the hell you are doing?" Pt with bizarre behaviors, difficult to redirect. MD aware.

## 2012-04-28 NOTE — ED Notes (Signed)
Pt c/o bilateral foot pain; no open sores; requesting for feet to be rechecked

## 2012-04-28 NOTE — ED Notes (Signed)
Feet soaked in warm soapy water to prep for medication administration.

## 2012-04-28 NOTE — ED Notes (Signed)
Pt to ED for "foot powder and soak". Pt denies injuries to feet. Pt states "I am here because one foot is longer than the other". Pt with malodorous feet.

## 2012-04-28 NOTE — ED Provider Notes (Signed)
History     CSN: [id number]  Arrival date & time 04/28/12  7829   First MD Initiated Contact with Patient 04/28/12 0257      Chief Complaint  Patient presents with  . Foot Pain    (Consider location/radiation/quality/duration/timing/severity/associated sxs/prior treatment) HPI Comments: Patient comes in today with bilateral foot pain.  Pain located on the bottom of both feet.  He has been evaluated for this numerous times in the ED.  He has been diagnosed with a fungal infection in the past and reports that he has not put antifungal cream on his feet.  He is homeless.  Pain today no different than the pain that he has had in the past.  No erythema or warmth.  Patient able to ambulate without difficulty.  No fever or chills.  No acute injury or trauma.  Patient is a 46 y.o. male presenting with lower extremity pain. The history is provided by the patient.  Foot Pain This is a chronic problem. The problem occurs constantly. The problem has been unchanged. Pertinent negatives include no chills, fever, nausea, numbness or vomiting.    Past Medical History  Diagnosis Date  . Foot pain     Past Surgical History  Procedure Date  . Hernia repair     History reviewed. No pertinent family history.  History  Substance Use Topics  . Smoking status: Current Everyday Smoker  . Smokeless tobacco: Not on file  . Alcohol Use: No      Review of Systems  Constitutional: Negative for fever and chills.  Gastrointestinal: Negative for nausea and vomiting.  Musculoskeletal: Negative for gait problem.  Skin: Negative for color change and wound.       No erythema or edema  Neurological: Negative for numbness.    Allergies  Tetanus toxoids  Home Medications  No current outpatient prescriptions on file.  BP 136/95  Pulse 95  Temp(Src) 99 F (37.2 C) (Oral)  Resp 20  SpO2 100%  Physical Exam  Nursing note and vitals reviewed. Constitutional: He appears well-developed and  well-nourished. No distress.  HENT:  Head: Normocephalic and atraumatic.  Mouth/Throat: Oropharynx is clear and moist.  Cardiovascular: Normal rate, regular rhythm and normal heart sounds.   Pulmonary/Chest: Effort normal and breath sounds normal.  Musculoskeletal: Normal range of motion.  Neurological: He is alert.  Skin: Skin is warm. He is not diaphoretic.       Significant xerosis of both feet with cracking.  Thickened skin on the bottom of both feet.    Psychiatric: He has a normal mood and affect.    ED Course  Procedures (including critical care time)  Labs Reviewed - No data to display No results found.   No diagnosis found.    MDM  Chronic foot pain.  Patient appears to have fungal infection of both feet.  Patient instructed to use OTC antifungal cream.        Magnus Sinning, PA-C 04/30/12 2349

## 2012-04-28 NOTE — Discharge Instructions (Signed)
Athlete's Foot Athlete's foot (tinea pedis) is a fungal infection of the skin on the feet. It often occurs on the skin between the toes or underneath the toes. It can also occur on the soles of the feet. Athlete's foot is more likely to occur in hot, humid weather. Not washing your feet or changing your socks often enough can contribute to athlete's foot. The infection can spread from person to person (contagious).  Use over the counter anti fungal lotion such as Lotrimin.   CAUSES Athlete's foot is caused by a fungus. This fungus thrives in warm, moist places. Most people get athlete's foot by sharing shower stalls, towels, and wet floors with an infected person. People with weakened immune systems, including those with diabetes, may be more likely to get athlete's foot. SYMPTOMS   Itchy areas between the toes or on the soles of the feet.   White, flaky, or scaly areas between the toes or on the soles of the feet.   Tiny, intensely itchy blisters between the toes or on the soles of the feet.   Tiny cuts on the skin. These cuts can develop a bacterial infection.   Thick or discolored toenails.  DIAGNOSIS  Your caregiver can usually tell what the problem is by doing a physical exam. Your caregiver may also take a skin sample from the rash area. The skin sample may be examined under a microscope, or it may be tested to see if fungus will grow in the sample. A sample may also be taken from your toenail for testing. TREATMENT  Over-the-counter and prescription medicines can be used to kill the fungus. These medicines are available as powders or creams. Your caregiver can suggest medicines for you. Fungal infections respond slowly to treatment. You may need to continue using your medicine for several weeks. PREVENTION   Do not share towels.   Wear sandals in wet areas, such as shared locker rooms and shared showers.   Keep your feet dry. Wear shoes that allow air to circulate. Wear cotton or wool  socks.  HOME CARE INSTRUCTIONS   Take medicines as directed by your caregiver. Do not use steroid creams on athlete's foot.   Keep your feet clean and cool. Wash your feet daily and dry them thoroughly, especially between your toes.   Change your socks every day. Wear cotton or wool socks. In hot climates, you may need to change your socks 2 to 3 times per day.   Wear sandals or canvas tennis shoes with good air circulation.   If you have blisters, soak your feet in Burow's solution or Epsom salts for 20 to 30 minutes, 2 times a day to dry out the blisters. Make sure you dry your feet thoroughly afterward.  SEEK MEDICAL CARE IF:   You have a fever.   You have swelling, soreness, warmth, or redness in your foot.   You are not getting better after 7 days of treatment.   You are not completely cured after 30 days.   You have any problems caused by your medicines.  MAKE SURE YOU:   Understand these instructions.   Will watch your condition.   Will get help right away if you are not doing well or get worse.  Document Released: 11/05/2000 Document Revised: 10/28/2011 Document Reviewed: 08/27/2011 [facility] Patient Information 2012 [place], [place].

## 2012-04-28 NOTE — ED Notes (Signed)
Pt escorted out by staff; pt would not sign discharge papers and refused education.

## 2012-05-01 NOTE — ED Provider Notes (Signed)
Medical screening examination/treatment/procedure(s) were performed by non-physician practitioner and as supervising physician I was immediately available for consultation/collaboration.  Sakinah Rosamond T Ashely Goosby, MD 05/01/12 1454 

## 2012-05-09 DIAGNOSIS — B353 Tinea pedis: Secondary | ICD-10-CM | POA: Insufficient documentation

## 2012-05-09 DIAGNOSIS — M79609 Pain in unspecified limb: Secondary | ICD-10-CM | POA: Insufficient documentation

## 2012-05-10 ENCOUNTER — Encounter (HOSPITAL_COMMUNITY): Payer: Self-pay | Admitting: *Deleted

## 2012-05-10 ENCOUNTER — Emergency Department (HOSPITAL_COMMUNITY)
Admission: EM | Admit: 2012-05-10 | Discharge: 2012-05-10 | Disposition: A | Discharge status: Home / Self Care | Payer: Self-pay | Attending: Emergency Medicine | Admitting: Emergency Medicine

## 2012-05-10 DIAGNOSIS — B353 Tinea pedis: Secondary | ICD-10-CM

## 2012-05-10 MED ORDER — NYSTATIN 100000 UNIT/GM EX POWD
Freq: Once | CUTANEOUS | Status: AC
  Administered 2012-05-10: 05:00:00 via TOPICAL
  Filled 2012-05-10: qty 15

## 2012-05-10 NOTE — Discharge Instructions (Signed)
Athlete's Foot  Athlete's foot is a skin infection caused by a fungus. Athlete's foot is often seen between or under the toes. It can also be seen on the bottom of the foot. Athlete's foot can spread to other people by sharing towels or shower stalls. HOME CARE  Only take medicines as told by your doctor. Do not use steroid creams.   Wash your feet daily. Dry your feet well, especially between the toes.   Change your socks every day. Wear cotton or wool socks.   Change your socks 2 to 3 times a day in hot weather.   Wear sandals or canvas tennis shoes with good airflow.   If you have blisters, soak your feet in a solution as told by your doctor. Do this for 20 to 30 minutes, 2 times a day. Dry your feet well after you soak them.   Do not share towels.   Wear sandals when you use shared locker rooms or showers.  GET HELP RIGHT AWAY IF:   You have a fever.   Your foot is puffy (swollen), sore, warm, or red.   You are not getting better after 7 days of treatment.   You still have athlete's foot after 30 days.   You have problems caused by your medicine.  MAKE SURE YOU:   Understand these instructions.   Will watch your condition.   Will get help right away if you are not doing well or get worse.  Document Released: 04/26/2008 Document Revised: 10/28/2011 Document Reviewed: 08/27/2011 [facility] Patient Information 2012 [place], [place].  RESOURCE GUIDE  Dental Problems  Patients with Medicaid: [facility] [phone] [address]                                           [phone] [address] Phone:  [phone]                                                  Phone:  [phone]  If unable to pay or uninsured, contact:  [facility] or [facility]. to become qualified for the adult dental clinic.  Chronic Pain Problems Contact [facility]  [phone] Patients need to be referred by their primary  care doctor.  Insufficient Money for Medicine Contact [employer]:  call "211" or [facility] (250)020-4867.  No Primary Care Doctor Call [facility]  714-495-3870 Other agencies that provide inexpensive medical care    [facility]  [phone]    [facility]  [phone]    [facility]  [phone]    [facility]  [phone]    [facility]  [phone]    [facility]  [phone]  Psychological Services [facility]  [phone] [facility]  [phone] [facility]   [phone] (emergency services [phone])  Substance Abuse Resources [facility]  340-592-6634 [facility] [phone] [facility] 640-015-7069 Floyd[facility] Flock ([phone] Residential & Outpatient Substance Abuse Program  [phone]  Abuse/Neglect [facility] Child Abuse Hotline 450-075-3975 [facility]  Child Abuse Hotline 872-633-2957 (After Hours)  Emergency Shelter [facility] [phone]  [facility] [facility] [phone] [facility] [phone]  MRSA Hotline #:   832-596-3325    [facility] Resources  [facility]     [employer]                          [facility] Dept. [address] [place]                       [address]      [address]  Blondell Reveal Phone:  [phone]                                   Phone:  [phone]                 Phone:  [phone]  [facility] Mental [facility] Phone:  [phone]  [facility] Child Abuse Hotline [phone] [phone] (After Hours)

## 2012-05-10 NOTE — ED Provider Notes (Signed)
History     CSN: [id number]  Arrival date & time 05/09/12  2303   First MD Initiated Contact with Patient 05/10/12 0305      Chief Complaint  Patient presents with  . Foot Pain    (Consider location/radiation/quality/duration/timing/severity/associated sxs/prior treatment) HPI  Patient comes in today with bilateral foot pain. Pain located on the bottom of both feet. He has been evaluated for this numerous times in the ED. He has been diagnosed with a fungal infection in the past and reports that he has not put antifungal cream on his feet. He is homeless. Pain today no different than the pain that he has had in the past. No erythema or warmth. Patient able to ambulate without difficulty. No fever or chills. No acute injury or trauma.  Patient is a 46 y.o. male presenting with lower extremity pain. The history is provided by the patient.  Foot Pain  This is a chronic problem. The problem occurs constantly. The problem has been unchanged. Pertinent negatives include no chills, fever, nausea, numbness or vomiting.    Past Medical History  Diagnosis Date  . Foot pain     Past Surgical History  Procedure Date  . Hernia repair     History reviewed. No pertinent family history.  History  Substance Use Topics  . Smoking status: Current Everyday Smoker  . Smokeless tobacco: Not on file  . Alcohol Use: No      Review of Systems   HEENT: denies blurry vision or change in hearing PULMONARY: Denies difficulty breathing and SOB CARDIAC: denies chest pain or heart palpitations MUSCULOSKELETAL:  denies being unable to ambulate ABDOMEN AL: denies abdominal pain GU: denies loss of bowel or urinary control NEURO: denies numbness and tingling in extremities      Allergies  Tetanus toxoids  Home Medications  No current outpatient prescriptions on file.  BP 127/76  Pulse 60  Temp 97.9 F (36.6 C) (Oral)  Resp 17  SpO2 99%  Physical Exam  Nursing note and vitals  reviewed. Constitutional: He appears well-developed and well-nourished. No distress.  HENT:  Head: Normocephalic and atraumatic.  Eyes: Pupils are equal, round, and reactive to light.  Neck: Normal range of motion. Neck supple.  Cardiovascular: Normal rate and regular rhythm.   Pulmonary/Chest: Effort normal.  Abdominal: Soft.  Neurological: He is alert.  Skin: Skin is warm and dry.       ED Course  Procedures (including critical care time)  Labs Reviewed - No data to display No results found.   1. Tinea pedis       MDM  Patient  Given nystatin cream in ED and I have also printed off resource sheet for him. I have encouraged patient to use resources and find a clinic to be seen in.  Pt has been advised of the symptoms that warrant their return to the ED. Patient has voiced understanding and has agreed to follow-up with the PCP or specialist.         Dorthula Matas, PA 05/10/12 0410

## 2012-05-10 NOTE — ED Notes (Signed)
Pt reports bilat foot pain "for a while now" - pt has been seen in ED multiple times before for same. Pt denies any mechanism of injury. Pt also c/o generalized body aches as well.

## 2012-05-10 NOTE — ED Provider Notes (Signed)
Medical screening examination/treatment/procedure(s) were performed by non-physician practitioner and as supervising physician I was immediately available for consultation/collaboration.   Tabytha Gradillas L Auriana Scalia, MD 05/10/12 0809 

## 2012-06-19 ENCOUNTER — Encounter (HOSPITAL_COMMUNITY): Payer: Self-pay | Admitting: Emergency Medicine

## 2012-06-19 ENCOUNTER — Emergency Department (HOSPITAL_COMMUNITY)
Admission: EM | Admit: 2012-06-19 | Discharge: 2012-06-19 | Disposition: A | Discharge status: Home / Self Care | Payer: Self-pay | Attending: Emergency Medicine | Admitting: Emergency Medicine

## 2012-06-19 DIAGNOSIS — B353 Tinea pedis: Secondary | ICD-10-CM | POA: Insufficient documentation

## 2012-06-19 DIAGNOSIS — F17200 Nicotine dependence, unspecified, uncomplicated: Secondary | ICD-10-CM | POA: Insufficient documentation

## 2012-06-19 DIAGNOSIS — G8929 Other chronic pain: Secondary | ICD-10-CM | POA: Insufficient documentation

## 2012-06-19 MED ORDER — OXYCODONE-ACETAMINOPHEN 5-325 MG PO TABS
2.0000 | ORAL_TABLET | Freq: Once | ORAL | Status: AC
  Administered 2012-06-19: 2 via ORAL
  Filled 2012-06-19 (×2): qty 1

## 2012-06-19 NOTE — ED Notes (Signed)
Pt presenting to ed with c/o bilateral feet pain with swelling x over one year pt states he just walked a couple of miles and his pain is worse. Pt states he thinks his feet are causing him to also have headaches from the pressure. Pt is alert and oriented at this time

## 2012-06-19 NOTE — ED Provider Notes (Signed)
History     CSN: [id number]  Arrival date & time 06/19/12  0754   First MD Initiated Contact with Patient 05/10/12 0305      Chief Complaint  Patient presents with  . Foot Pain    ) Foot Pain  Patient is a 46 y.o. male presenting with lower extremity pain.    Patient comes in today with bilateral foot pain. Pain located on the bottom of both feet. He has been evaluated for this numerous times in the ED. He has been diagnosed with a fungal infection in the past and reports that he has not put antifungal cream on his feet. He is homeless. Pain today no different than the pain that he has had in the past. No erythema or warmth. Patient able to ambulate without difficulty. No fever or chills. No acute injury or trauma.  Patient is a 46 y.o. male presenting with lower extremity pain. The history is provided by the patient.  Foot Pain  This is a chronic problem. The problem occurs constantly. The problem has been unchanged. Pertinent negatives include no chills, fever, nausea, numbness or vomiting.    Past Medical History  Diagnosis Date  . Foot pain     Past Surgical History  Procedure Date  . Hernia repair     No family history on file.  History  Substance Use Topics  . Smoking status: Current Everyday Smoker    Types: Cigarettes  . Smokeless tobacco: Not on file  . Alcohol Use: No      Review of Systems    HEENT: denies blurry vision or change in hearing PULMONARY: Denies difficulty breathing and SOB CARDIAC: denies chest pain or heart palpitations MUSCULOSKELETAL:  denies being unable to ambulate ABDOMEN AL: denies abdominal pain GU: denies loss of bowel or urinary control NEURO: denies numbness and tingling in extremities      Allergies  Aspirin and Tetanus toxoids  Home Medications  No current outpatient prescriptions on file.  BP 112/68  Pulse 84  Temp 98.3 F (36.8 C) (Oral)  Resp 20  SpO2 100%  Physical Exam  Nursing note and vitals  reviewed. Constitutional: He appears well-developed and well-nourished. No distress.  HENT:  Head: Normocephalic and atraumatic.  Eyes: Pupils are equal, round, and reactive to light.  Neck: Normal range of motion. Neck supple.  Cardiovascular: Normal rate and regular rhythm.   Pulmonary/Chest: Effort normal.  Abdominal: Soft.  Neurological: He is alert.  Skin: Skin is warm and dry.       ED Course  Procedures  (including critical care time)  Labs Reviewed - No data to display No results found.   1. Chronic foot pain   2. Tinea pedis       MDM  Patient has been treated for sign complaint many times in the past.  I explained to him that until he is able to change his social situation in order to get off his feet and allow healing process to occur, it is unlikely that we are going to make significant headway in treating his underlying problem.  Resource guide was given.         Nelia Shi, MD 06/19/12 [phone]

## 2012-07-14 DIAGNOSIS — Z888 Allergy status to other drugs, medicaments and biological substances status: Secondary | ICD-10-CM | POA: Insufficient documentation

## 2012-07-14 DIAGNOSIS — B353 Tinea pedis: Secondary | ICD-10-CM | POA: Insufficient documentation

## 2012-07-14 DIAGNOSIS — F17200 Nicotine dependence, unspecified, uncomplicated: Secondary | ICD-10-CM | POA: Insufficient documentation

## 2012-07-15 ENCOUNTER — Emergency Department (HOSPITAL_COMMUNITY)
Admission: EM | Admit: 2012-07-15 | Discharge: 2012-07-15 | Disposition: A | Discharge status: Home / Self Care | Payer: Self-pay | Attending: Emergency Medicine | Admitting: Emergency Medicine

## 2012-07-15 DIAGNOSIS — B353 Tinea pedis: Secondary | ICD-10-CM

## 2012-07-15 MED ORDER — ACETAMINOPHEN 325 MG PO TABS
650.0000 mg | ORAL_TABLET | Freq: Once | ORAL | Status: DC
  Filled 2012-07-15: qty 2

## 2012-07-15 NOTE — ED Notes (Addendum)
Pt c/o of generalized body aches, abdominal cramping, and bilateral feet pain. Pt denies any falls, or injury to feet. Pt reports having a fever earlier this week. Pt is afrebrial upon assessment.

## 2012-07-15 NOTE — ED Provider Notes (Signed)
History     CSN: [id number]  Arrival date & time 07/14/12  2353   First MD Initiated Contact with Patient 07/15/12 0423      Chief Complaint  Patient presents with  . Generalized Body Aches  . Foot Pain    bilateral     HPI Pt was seen at 0510.  Per pt, c/o gradual onset and persistence of constant acute flair of his chronic bilat feet "pain" and "rash" that began over 1 year ago.  Pt has not been using anti-fungal cream as previously suggested. Denies any change in his usual symptoms today.  The symptoms have been associated with no other complaints. The patient has a significant history of similar symptoms previously, recently being evaluated for this complaint and multiple prior evals for same.       Past Medical History  Diagnosis Date  . Foot pain     Past Surgical History  Procedure Date  . Hernia repair     History  Substance Use Topics  . Smoking status: Current Everyday Smoker    Types: Cigarettes  . Smokeless tobacco: Not on file  . Alcohol Use: No      Review of Systems ROS: Statement: All systems negative except as marked or noted in the HPI; Constitutional: Negative for fever and chills. ; ; Eyes: Negative for eye pain, redness and discharge. ; ; ENMT: Negative for ear pain, hoarseness, nasal congestion, sinus pressure and sore throat. ; ; Cardiovascular: Negative for chest pain, palpitations, diaphoresis, dyspnea and peripheral edema. ; ; Respiratory: Negative for cough, wheezing and stridor. ; ; Gastrointestinal: Negative for nausea, vomiting, diarrhea, abdominal pain, blood in stool, hematemesis, jaundice and rectal bleeding. . ; ; Genitourinary: Negative for dysuria, flank pain and hematuria. ; ; Musculoskeletal: Negative for back pain and neck pain. Negative for swelling and trauma.; ; Skin: +rash. Negative for pruritus, abrasions, blisters, bruising and skin lesion.; ; Neuro: Negative for headache, lightheadedness and neck stiffness. Negative for weakness,  altered level of consciousness , altered mental status, extremity weakness, paresthesias, involuntary movement, seizure and syncope.       Allergies  Aspirin and Tetanus toxoids  Home Medications  No current outpatient prescriptions on file.  BP 122/77  Pulse 60  Temp 98.3 F (36.8 C) (Oral)  Resp 18  SpO2 100%  Physical Exam 0515: Physical examination:  Nursing notes reviewed; Vital signs and O2 SAT reviewed;  Constitutional: Well developed, Well nourished, Well hydrated, In no acute distress; Head:  Normocephalic, atraumatic; Eyes: EOMI, PERRL, No scleral icterus; ENMT: Mouth and pharynx normal, Mucous membranes moist; Neck: Supple, Full range of motion, No lymphadenopathy; Cardiovascular: Regular rate and rhythm, No gallop; Respiratory: Breath sounds clear & equal bilaterally, No wheezes.  Speaking full sentences with ease, Normal respiratory effort/excursion; Chest: Nontender, Movement normal; Extremities: Pulses normal, No tenderness, No edema, No calf edema or asymmetry.; Neuro: AA&Ox3, Major CN grossly intact.  Speech clear. No gross focal motor or sensory deficits in extremities.; Skin: Color normal, Warm, Dry, +tinea bilat feet, no open wounds, no blisters, no erythema, no ulcers.   ED Course  Procedures   MDM  MDM Reviewed: nursing note, vitals and previous chart      0600:  Pt has been to the ED multiple times for feet "pain" and "rash."  States he "just needs some tylenol."  Denies any other complaints at this time to me during my assessment. Pt encouraged to f/u with PMD.       Nicholos Johns  Burnett Harry, DO 07/16/12 1925

## 2012-07-15 NOTE — ED Notes (Signed)
Pt is not in room to receive medication

## 2012-07-15 NOTE — ED Notes (Signed)
Complaints of bilateral foot pain,  This is chronic complaint, pt is homeless

## 2012-08-07 ENCOUNTER — Emergency Department (HOSPITAL_COMMUNITY)
Admission: EM | Admit: 2012-08-07 | Discharge: 2012-08-07 | Disposition: A | Discharge status: Home / Self Care | Payer: Self-pay | Attending: Emergency Medicine | Admitting: Emergency Medicine

## 2012-08-07 ENCOUNTER — Encounter (HOSPITAL_COMMUNITY): Payer: Self-pay | Admitting: Emergency Medicine

## 2012-08-07 DIAGNOSIS — G8929 Other chronic pain: Secondary | ICD-10-CM | POA: Insufficient documentation

## 2012-08-07 DIAGNOSIS — X58XXXA Exposure to other specified factors, initial encounter: Secondary | ICD-10-CM | POA: Insufficient documentation

## 2012-08-07 DIAGNOSIS — Z5900 Homelessness unspecified: Secondary | ICD-10-CM | POA: Insufficient documentation

## 2012-08-07 DIAGNOSIS — F17200 Nicotine dependence, unspecified, uncomplicated: Secondary | ICD-10-CM | POA: Insufficient documentation

## 2012-08-07 DIAGNOSIS — S61219A Laceration without foreign body of unspecified finger without damage to nail, initial encounter: Secondary | ICD-10-CM

## 2012-08-07 DIAGNOSIS — S61209A Unspecified open wound of unspecified finger without damage to nail, initial encounter: Secondary | ICD-10-CM | POA: Insufficient documentation

## 2012-08-07 DIAGNOSIS — M79609 Pain in unspecified limb: Secondary | ICD-10-CM | POA: Insufficient documentation

## 2012-08-07 DIAGNOSIS — M79673 Pain in unspecified foot: Secondary | ICD-10-CM

## 2012-08-07 MED ORDER — BACITRACIN ZINC 500 UNIT/GM EX OINT
TOPICAL_OINTMENT | CUTANEOUS | Status: AC
  Administered 2012-08-07: 06:00:00
  Filled 2012-08-07: qty 0.9

## 2012-08-07 NOTE — ED Notes (Signed)
Pt alert, nad, arrives from home, c/o gen body aches, resp even unlabored, skin pwd, pt appears homeless

## 2012-08-07 NOTE — ED Provider Notes (Signed)
Medical screening examination/treatment/procedure(s) were performed by non-physician practitioner and as supervising physician I was immediately available for consultation/collaboration.  Bryonna Sundby M Keirstin Musil, MD 08/07/12 2136 

## 2012-08-07 NOTE — ED Provider Notes (Signed)
History     CSN: [id number]  Arrival date & time 08/07/12  0018   First MD Initiated Contact with Patient 08/07/12 0440      Chief Complaint  Patient presents with  . Generalized Body Aches   HPI  History provided by the patient. Patient is a 46 year old male with no significant PMH who presents with multiple complaints. Patient admits to being homeless currently living outside. Patient complains of bilateral foot pains as well as having a small abrasion/laceration to right index finger. Patient asked if his finger can be cleaned and bandaged. Patient currently is not have any primary care followup. He has multiple prior evaluations for similar complaints of feet and has been diagnosed with fungal infections. He denies any new symptoms or changes. Denies any fever, chills or sweats. There is no significant swelling of the feet or legs. Denies any chest pain or shortness of breath.    Past Medical History  Diagnosis Date  . Foot pain     Past Surgical History  Procedure Date  . Hernia repair     No family history on file.  History  Substance Use Topics  . Smoking status: Current Every Day Smoker    Types: Cigarettes  . Smokeless tobacco: Not on file  . Alcohol Use: No      Review of Systems  Constitutional: Negative for fever and chills.  Respiratory: Negative for cough and shortness of breath.   Cardiovascular: Negative for chest pain.  Gastrointestinal: Negative for nausea and vomiting.  Musculoskeletal:       Bilateral foot pains.    Allergies  Aspirin and Tetanus toxoids  Home Medications  No current outpatient prescriptions on file.  BP 132/79  Pulse 63  Temp 97.6 F (36.4 C) (Oral)  Resp 18  SpO2 96%  Physical Exam  Nursing note and vitals reviewed. Constitutional: He appears well-developed and well-nourished. No distress.  HENT:  Head: Normocephalic.  Cardiovascular: Normal rate and regular rhythm.   No murmur heard. Pulmonary/Chest: Effort  normal and breath sounds normal. No respiratory distress. He has no wheezes. He has no rales.  Abdominal: Soft. There is no tenderness. There is no rebound and no guarding.  Musculoskeletal: Normal range of motion.       There is thickening of the plantar surface of bilateral feet with calluses and rough skin dry skin. Toenails thickened and yellow and dark in coloration.  Small superficial laceration over the distal dorsal aspect of right index finger. Normal range of motion. No diffuse swelling of the finger. Mild tenderness to palpation. No bleeding or drainage.  Neurological: He is alert.  Skin: Skin is warm.  Psychiatric: He has a normal mood and affect. His behavior is normal.    ED Course  Procedures     1. Chronic foot pain   2. Laceration of finger       MDM  5:00 AM patient seen and evaluated. Patient reports that he is homeless and has no place to stay. Patient came to emergency room to rest but also states that he has pain in his feet. Patient has been seen for similar symptoms previously. Patient also complains of small abrasion/Laceration to right index finger. Patient states he is allergic to tetanus and cannot have a tetanus shot.        Angus Seller, [place] 08/07/12 [phone]

## 2012-09-02 ENCOUNTER — Encounter (HOSPITAL_COMMUNITY): Payer: Self-pay | Admitting: *Deleted

## 2012-09-02 DIAGNOSIS — Z888 Allergy status to other drugs, medicaments and biological substances status: Secondary | ICD-10-CM | POA: Insufficient documentation

## 2012-09-02 DIAGNOSIS — J069 Acute upper respiratory infection, unspecified: Secondary | ICD-10-CM | POA: Insufficient documentation

## 2012-09-02 DIAGNOSIS — F17200 Nicotine dependence, unspecified, uncomplicated: Secondary | ICD-10-CM | POA: Insufficient documentation

## 2012-09-02 DIAGNOSIS — R5381 Other malaise: Secondary | ICD-10-CM | POA: Insufficient documentation

## 2012-09-02 NOTE — ED Notes (Signed)
The pt has  Been aching all lover for one week.   No nv or diarrhea

## 2012-09-03 ENCOUNTER — Emergency Department (HOSPITAL_COMMUNITY): Payer: Self-pay

## 2012-09-03 ENCOUNTER — Emergency Department (HOSPITAL_COMMUNITY)
Admission: EM | Admit: 2012-09-03 | Discharge: 2012-09-03 | Disposition: A | Discharge status: Home / Self Care | Payer: Self-pay | Attending: Emergency Medicine | Admitting: Emergency Medicine

## 2012-09-03 ENCOUNTER — Encounter (HOSPITAL_COMMUNITY): Payer: Self-pay

## 2012-09-03 DIAGNOSIS — J069 Acute upper respiratory infection, unspecified: Secondary | ICD-10-CM

## 2012-09-03 LAB — CBC WITH DIFFERENTIAL/PLATELET
Eosinophils Absolute: 0.2 10*3/uL (ref 0.0–0.7)
Lymphocytes Relative: 34 % (ref 12–46)
Lymphs Abs: 1.5 10*3/uL (ref 0.7–4.0)
MCH: 27.1 pg (ref 26.0–34.0)
Neutrophils Relative %: 52 % (ref 43–77)
Platelets: 182 10*3/uL (ref 150–400)
RBC: 4.62 MIL/uL (ref 4.22–5.81)
WBC: 4.4 10*3/uL (ref 4.0–10.5)

## 2012-09-03 LAB — COMPREHENSIVE METABOLIC PANEL
ALT: 10 U/L (ref 0–53)
Alkaline Phosphatase: 75 U/L (ref 39–117)
GFR calc Af Amer: 89 mL/min — ABNORMAL LOW (ref >90)
Glucose, Bld: 99 mg/dL (ref 70–99)
Potassium: 3.6 mEq/L (ref 3.5–5.1)
Sodium: 141 mEq/L (ref 135–145)
Total Protein: 6.9 g/dL (ref 6.0–8.3)

## 2012-09-03 MED ORDER — ACETAMINOPHEN 325 MG PO TABS
650.0000 mg | ORAL_TABLET | Freq: Once | ORAL | Status: AC
  Administered 2012-09-03: 650 mg via ORAL
  Filled 2012-09-03: qty 2

## 2012-09-03 MED ORDER — IBUPROFEN 800 MG PO TABS: 800.0000 mg | ORAL_TABLET | Freq: Three times a day (TID) | ORAL | Status: DC

## 2012-09-03 MED ORDER — IBUPROFEN 400 MG PO TABS
400.0000 mg | ORAL_TABLET | Freq: Once | ORAL | Status: DC
  Filled 2012-09-03: qty 1

## 2012-09-03 NOTE — ED Notes (Signed)
Pt reports progressively worsening generalized body pains x 1 wk. Pt reports productive cough x 1 wk. Pt states "I feel like I have the flu. I have lived on the streets for the last 4 yrs, so I get pneumonia sometimes." Denies N/V/D or fever. Pt reports intermittent dizziness, but denies at this time.

## 2012-09-03 NOTE — Discharge Instructions (Signed)
Antibiotic Nonuse  Your caregiver felt that the infection or problem was not one that would be helped with an antibiotic. Infections may be caused by viruses or bacteria. Only a caregiver can tell which one of these is the likely cause of an illness. A cold is the most common cause of infection in both adults and children. A cold is a virus. Antibiotic treatment will have no effect on a viral infection. Viruses can lead to many lost days of work caring for sick children and many missed days of school. Children may catch as many as 10 "colds" or "flus" per year during which they can be tearful, cranky, and uncomfortable. The goal of treating a virus is aimed at keeping the ill person comfortable. Antibiotics are medications used to help the body fight bacterial infections. There are relatively few types of bacteria that cause infections but there are hundreds of viruses. While both viruses and bacteria cause infection they are very different types of germs. A viral infection will typically go away by itself within 7 to 10 days. Bacterial infections may spread or get worse without antibiotic treatment. Examples of bacterial infections are:  Sore throats (like strep throat or tonsillitis).  Infection in the lung (pneumonia).  Ear and skin infections. Examples of viral infections are:  Colds or flus.  Most coughs and bronchitis.  Sore throats not caused by Strep.  Runny noses. It is often best not to take an antibiotic when a viral infection is the cause of the problem. Antibiotics can kill off the helpful bacteria that we have inside our body and allow harmful bacteria to start growing. Antibiotics can cause side effects such as allergies, nausea, and diarrhea without helping to improve the symptoms of the viral infection. Additionally, repeated uses of antibiotics can cause bacteria inside of our body to become resistant. That resistance can be passed onto harmful bacterial. The next time you have  an infection it may be harder to treat if antibiotics are used when they are not needed. Not treating with antibiotics allows our own immune system to develop and take care of infections more efficiently. Also, antibiotics will work better for [place] when they are prescribed for bacterial infections. Treatments for a child that is ill may include:  Give extra fluids throughout the day to stay hydrated.  Get plenty of rest.  Only give your child over-the-counter or prescription medicines for pain, discomfort, or fever as directed by your caregiver.  The use of a cool mist humidifier may help stuffy noses.  Cold medications if suggested by your caregiver. Your caregiver may decide to start you on an antibiotic if:  The problem you were seen for today continues for a longer length of time than expected.  You develop a secondary bacterial infection. SEEK MEDICAL CARE IF:  Fever lasts longer than 5 days.  Symptoms continue to get worse after 5 to 7 days or become severe.  Difficulty in breathing develops.  Signs of dehydration develop (poor drinking, rare urinating, dark colored urine).  Changes in behavior or worsening tiredness (listlessness or lethargy). Document Released: 01/17/2002 Document Revised: 01/31/2012 Document Reviewed: 07/16/2009 [facility] Patient Information 2013 [place], [place]. Saline Nose Drops  To help clear a stuffy nose, put salt water (saline) nose drops in your infant's nose. This helps to loosen the secretions in the nose. Use a bulb syringe to clean the nose out:  Before feeding.  Before putting your infant down for naps.  No more than once every 3  hours to avoid irritating your infant's nostrils. HOME CARE  Buy nose drops at your local drug store. You can also make nose drops yourself. Mix 1 cup of water with  teaspoon of salt. Stir. Store this mixture at room temperature. Make a new batch daily.  To use the drops:  Put 1 or 2 drops in each side of  infant's nose with a clean medicine dropper. Do not use this dropper for any other medicine.  Squeeze the air out of the suction bulb before inserting it into your infant's nose.  While still squeezing the bulb flat, place the tip of the bulb into a nostril. Let air come back into the bulb. The suction will pull snot out of the nose and into the bulb.  Repeat on other nostril.  Squeeze the bulb several times into a tissue and wash the bulb tip in soapy water. Store the bulb with the tip side down on paper towel.  Use the bulb syringe with only the saline drops to avoid irritating your infant's nostrils. GET HELP RIGHT AWAY IF:  The snot changes to green or yellow.  The snot gets thicker.  Your infant is 3 months or younger with a rectal temperature of 100.4 F (38 C) or higher.  Your infant is older than 3 months with a rectal temperature of 102 F (38.9 C) or higher.  The stuffy nose lasts 10 days or longer.  There is trouble breathing or feeding. MAKE SURE YOU:  Understand these instructions.  Will watch your infant's condition.  Will get help right away if your infant is not doing well or gets worse. Document Released: 09/05/2009 Document Revised: 01/31/2012 Document Reviewed: 09/05/2009 [facility] Patient Information 2013 [place], [place].  RESOURCE GUIDE  Chronic Pain Problems: Contact [facility]  [phone] Patients need to be referred by their primary care doctor.  Insufficient Money for Medicine: Contact United Way:  call "211" or [facility] [phone].  No Primary Care Doctor: - Call [facility]  [phone] - can help you locate a primary care doctor that  accepts your insurance, provides certain services, etc. - Physician Referral [phone]  Agencies that provide inexpensive medical care: - [facility]  [phone] - [facility]  [phone] - [facility]   [phone] [facility]  [phone] - Planned Parenthood  [phone] [facility]  [phone]  Medicaid-accepting [facility] Providers: - [facility]- [address]  [phone], Mon-Fri 9am-7pm, Sat 9am-1pm - [facility]- [address] [place], [address] [place]  [phone] - [facility]- [address], [address] [place]  [phone] [facility]- [address]  [phone] - Renaye Rakers- [address] [place], [address], [phone]  Only accepts [place] patients after they have their name  applied to their card  Self Pay (no insurance) in [place]: - Sickle Cell Patients: Dr Willey Blade, [facility]  [address] [place], [phone] - [facility]- [address] [place]  [phone]       [facility] [place]- [address]       -     [facility]- see information above (Speak to [employer] if you do not have insurance)       -  [phone]  [address] [place], [phone]       -  [facility]- [address],  [phone]       -  [facility]- [address] [phone]       -  Dr Julio Sicks-  [address], [address], [place], [phone]       -  [facility]- [address] [phone]       -  [facility]- [address], [phone], also [address], [phone]       -    [facility]- [address] [place], [phone], 1st & 3rd Saturday   every month, 10am-1pm  1) Find a Doctor and Pay Out of Pocket Although you won't have to find out who is covered by your insurance plan, it is a good idea to ask around and get recommendations. You will then need to call the office and see if the doctor you have chosen will accept you as a new patient and what types of options they offer for patients who are self-pay. Some doctors offer discounts or will set up  payment plans for their patients who do not have insurance, but you will need to ask so you aren't surprised when you get to your appointment.  2) Contact Your Local Health Department Not all health departments have doctors that can see patients for sick visits, but many do, so it is worth a call to see if yours does. If you don't know where your local health department is, you can check in your phone book. The CDC also has a tool to help you locate your state's health department, and many state websites also have listings of all of their local health departments.  3) Find a Walk-in Clinic If your illness is not likely to be very severe or complicated, you may want to try a walk in clinic. These are popping up all over the country in pharmacies, drugstores, and shopping centers. They're usually staffed by nurse practitioners or physician assistants that have been trained to treat common illnesses and complaints. They're usually fairly quick and inexpensive. However, if you have serious medical issues or chronic medical problems, these are probably not your best option  STD Testing - [facility] Department of [facility], STD Clinic, [address] [place], phone [phone] or [phone].  Monday - Friday, call for an appointment. [facility] Department of [employer], STD Clinic, [address], [place], phone [phone] or [phone].  Monday - Friday, call for an appointment.  Abuse/Neglect: [facility] Child Abuse Hotline 757-766-6073 [facility] Child Abuse Hotline 234-708-4822 (After Hours)  Emergency Shelter:  [facility] [phone]  Maternity Homes: - [facility] [place] [facility] [phone] - [facility] [phone]  MRSA Hotline #:   514-369-2544  [facility] Resources  [facility] [place]  [facility] Dept. [address]                  [address]         [address] [place] [address]  [address]  Cristobal Goldmann Phone:  [phone]                                  Phone:  [phone]                   Phone:  [phone]  [facility], [phone] - [facility] - [phone]       -     [facility] in [place], [address],                                  [phone], [facility] Child Abuse Hotline (716)182-1750 or [phone] (After Hours)   Behavioral Health Services  Substance Abuse Resources: - [facility]  (518) [phone] - [facility] [phone] - [facility] [phone] Floydene Flock [phone] - Residential & Outpatient Substance Abuse Program  [phone]  Psychological Services: [facility]  [phone] [facility]  [phone] - [facility], [phone] [place]. [address] [place], ACCESS LINE: (229)[phone] or [phone], [email or web address]  Dental Assistance  If unable to pay or uninsured, contact:  [facility] or [facility]. to become qualified for the adult dental clinic.  Patients with Medicaid: [facility] [phone] [address], [phone] [address] [phone]  If unable to pay, or uninsured, contact [facility] ([phone]) or [facility] Department [phone] in [place], [phone] in [facility]) to become qualified for the adult dental clinic  Other Low-Cost Community Dental Services: - [facility]- [address] [place], [place], [place], [address], [phone], [phone], 2nd and 4th Thursday of the month at 6:30am.  10 clients each day by appointment, can sometimes see walk-in patients if someone does not show for an appointment. [facility]- [address] Ether Griffins [place], [place], [address], [phone] - [facility]- [address] [place], [place], [address], [phone] - [facility]- [phone] [facility]- [phone] [facility] Department- [phone]

## 2012-09-03 NOTE — ED Provider Notes (Signed)
History     CSN: [id number]  Arrival date & time 09/02/12  2230   First MD Initiated Contact with Patient 09/03/12 0129      Chief Complaint  Patient presents with  . aching all over     (Consider location/radiation/quality/duration/timing/severity/associated sxs/prior treatment) HPI HX per PT. Not feeling well last few days with cough cold and congestion, worried he may the flu, presents requesting something to eat and something to drink. Symptoms mild in severity, has not taken anything for these symptoms. No SOB or wheezing, no CP or rash, no known sick contacts.  Past Medical History  Diagnosis Date  . Foot pain     Past Surgical History  Procedure Date  . Hernia repair     History reviewed. No pertinent family history.  History  Substance Use Topics  . Smoking status: Current Every Day Smoker    Types: Cigarettes  . Smokeless tobacco: Not on file  . Alcohol Use: No      Review of Systems  Constitutional: Negative for fever and chills.  HENT: Positive for congestion. Negative for neck pain and neck stiffness.   Eyes: Negative for pain.  Respiratory: Positive for cough. Negative for shortness of breath.   Cardiovascular: Negative for chest pain.  Gastrointestinal: Negative for abdominal pain.  Genitourinary: Negative for dysuria.  Musculoskeletal: Negative for back pain.  Skin: Negative for rash.  Neurological: Negative for headaches.  All other systems reviewed and are negative.    Allergies  Aspirin and Tetanus toxoids  Home Medications  No current outpatient prescriptions on file.  BP 144/78  Pulse 83  Temp 98.9 F (37.2 C) (Oral)  Resp 18  SpO2 96%  Physical Exam  Constitutional: He is oriented to person, place, and time. He appears well-developed and well-nourished.  HENT:  Head: Normocephalic and atraumatic.  Right Ear: External ear normal.  Left Ear: External ear normal.  Mouth/Throat: No oropharyngeal exudate.  Eyes: EOM are  normal. Pupils are equal, round, and reactive to light.  Neck: Neck supple.  Cardiovascular: Normal rate, regular rhythm and intact distal pulses.   Pulmonary/Chest: Effort normal and breath sounds normal. No respiratory distress. He has no wheezes. He exhibits no tenderness.  Abdominal: Soft. Bowel sounds are normal. He exhibits no distension. There is no tenderness. There is no rebound.  Musculoskeletal: Normal range of motion. He exhibits no edema.  Neurological: He is alert and oriented to person, place, and time.  Skin: Skin is warm and dry.    ED Course  Procedures (including critical care time)  Results for orders placed during the hospital encounter of 09/03/12  CBC WITH DIFFERENTIAL      Component Value Range   WBC 4.4  4.0 - 10.5 K/uL   RBC 4.62  4.22 - 5.81 MIL/uL   Hemoglobin 12.5 (*) 13.0 - 17.0 g/dL   HCT 40.9 (*) 81.1 - 91.4 %   MCV 82.0  78.0 - 100.0 fL   MCH 27.1  26.0 - 34.0 pg   MCHC 33.0  30.0 - 36.0 g/dL   RDW 78.2  95.6 - 21.3 %   Platelets 182  150 - 400 K/uL   Neutrophils Relative 52  43 - 77 %   Neutro Abs 2.3  1.7 - 7.7 K/uL   Lymphocytes Relative 34  12 - 46 %   Lymphs Abs 1.5  0.7 - 4.0 K/uL   Monocytes Relative 10  3 - 12 %   Monocytes Absolute 0.5  0.1 -  1.0 K/uL   Eosinophils Relative 3  0 - 5 %   Eosinophils Absolute 0.2  0.0 - 0.7 K/uL   Basophils Relative 1  0 - 1 %   Basophils Absolute 0.0  0.0 - 0.1 K/uL  COMPREHENSIVE METABOLIC PANEL      Component Value Range   Sodium 141  135 - 145 mEq/L   Potassium 3.6  3.5 - 5.1 mEq/L   Chloride 104  96 - 112 mEq/L   CO2 27  19 - 32 mEq/L   Glucose, Bld 99  70 - 99 mg/dL   BUN 13  6 - 23 mg/dL   Creatinine, Ser 9.60  0.50 - 1.35 mg/dL   Calcium 9.1  8.4 - 45.4 mg/dL   Total Protein 6.9  6.0 - 8.3 g/dL   Albumin 3.6  3.5 - 5.2 g/dL   AST 15  0 - 37 U/L   ALT 10  0 - 53 U/L   Alkaline Phosphatase 75  39 - 117 U/L   Total Bilirubin 0.1 (*) 0.3 - 1.2 mg/dL   GFR calc non Af Amer 77 (*) >90 mL/min    GFR calc Af Amer 89 (*) >90 mL/min   Dg Chest 2 View  09/03/2012  *RADIOLOGY REPORT*  Clinical Data: Cough, cold-like symptoms  CHEST - 2 VIEW  Comparison: 01/07/2012  Findings: Lungs are essentially clear. No pleural effusion or pneumothorax.  Cardiomediastinal silhouette is within normal limits.  Degenerative changes of the visualized thoracolumbar spine.  IMPRESSION: No evidence of acute cardiopulmonary disease.   Original Report Authenticated By: Charline Bills, M.D.    PO fluids, tylenol and motrin provided.   2:50 AM on recheck is feeling better and feels comfortable for d/c home. No indication for admit or further work up at this time. CXr and labs ordered by triage RN reviewed as above. No PNA. No hypoxia. VS WNL.    MDM   Viral symptoms, no recorded fever, non toxic appearing with normal exam and work up as above.         Sunnie Nielsen, MD 09/03/12 [phone]

## 2013-04-19 ENCOUNTER — Emergency Department (HOSPITAL_COMMUNITY): Payer: Self-pay

## 2013-04-19 ENCOUNTER — Emergency Department (HOSPITAL_COMMUNITY)
Admission: EM | Admit: 2013-04-19 | Discharge: 2013-04-19 | Disposition: A | Discharge status: Home / Self Care | Payer: Self-pay | Attending: Emergency Medicine | Admitting: Emergency Medicine

## 2013-04-19 ENCOUNTER — Encounter (HOSPITAL_COMMUNITY): Payer: Self-pay | Admitting: *Deleted

## 2013-04-19 DIAGNOSIS — F17200 Nicotine dependence, unspecified, uncomplicated: Secondary | ICD-10-CM | POA: Insufficient documentation

## 2013-04-19 DIAGNOSIS — Z5900 Homelessness unspecified: Secondary | ICD-10-CM | POA: Insufficient documentation

## 2013-04-19 DIAGNOSIS — M79645 Pain in left finger(s): Secondary | ICD-10-CM

## 2013-04-19 DIAGNOSIS — B353 Tinea pedis: Secondary | ICD-10-CM | POA: Insufficient documentation

## 2013-04-19 MED ORDER — CLOTRIMAZOLE 1 % EX CREA: TOPICAL_CREAM | CUTANEOUS | Status: DC

## 2013-04-19 MED ORDER — IBUPROFEN 400 MG PO TABS
800.0000 mg | ORAL_TABLET | Freq: Once | ORAL | Status: AC
  Administered 2013-04-19: 800 mg via ORAL
  Filled 2013-04-19: qty 2

## 2013-04-19 MED ORDER — ACETAMINOPHEN ER 650 MG PO TBCR: 650.0000 mg | EXTENDED_RELEASE_TABLET | Freq: Three times a day (TID) | ORAL | Status: DC | PRN

## 2013-04-19 NOTE — ED Provider Notes (Signed)
History     CSN: [id number]  Arrival date & time 04/19/13  1303   First MD Initiated Contact with Patient 04/19/13 1423      Chief Complaint  Patient presents with  . Finger Injury    (Consider location/radiation/quality/duration/timing/severity/associated sxs/prior treatment) HPI  Colton Lee is a 47 year old male who has been homeless since 2009.  He presents to the emergency department with chief complaint of left finger pain and left foot pain.  The patient has a very strange affect.  Is difficult to follow his speech.  He speaks softly.  Patient states that [place] since [place] his home in 2009.  He has been seen here in the emergency department for the same complaint several times.  He complains of itching dry skin on the left sole of the foot.  Patient has been applying Goldbond powder without relief of his symptoms.  He also has left first finger pain and tightness.  He is missing the distal end of his digit.  Patient states he "woke up with it."  He is unsure how it occurred.  He denies alcoholism or drug use.    Past Medical History  Diagnosis Date  . Foot pain     Past Surgical History  Procedure Laterality Date  . Hernia repair      History reviewed. No pertinent family history.  History  Substance Use Topics  . Smoking status: Current Every Day Smoker    Types: Cigarettes  . Smokeless tobacco: Not on file  . Alcohol Use: No      Review of Systems Ten systems reviewed and are negative for acute change, except as noted in the HPI.   Allergies  Aspirin and Tetanus toxoids  Home Medications   Current Outpatient Rx  Name  Route  Sig  Dispense  Refill  . ibuprofen (ADVIL,MOTRIN) 800 MG tablet   Oral   Take 1 tablet (800 mg total) by mouth 3 (three) times daily.   21 tablet   0     BP 137/87  Pulse 77  Temp(Src) 98.5 F (36.9 C) (Oral)  Resp 18  SpO2 99%  Physical Exam Physical Exam  Nursing note  and vitals reviewed. Constitutional: He appears well-developed and well-nourished. No distress.  HENT:  Head: Normocephalic and atraumatic.  Eyes: Conjunctivae normal are normal. No scleral icterus.  Neck: Normal range of motion. Neck supple.  Cardiovascular: Normal rate, regular rhythm and normal heart sounds.   Pulmonary/Chest: Effort normal and breath sounds normal. No respiratory distress.  Abdominal: Soft. There is no tenderness.  Musculoskeletal: He exhibits no edema.  distal end of the left first finger is sclerosed.  It appears to have been removed.  There is a central scar.  It is tender to palpation without heat or erythema or discharge.   Neurological: He is alert.  Skin: Skin is warm and dry. He is not diaphoretic.  left foot with xerotic skin in moccasin distribution consistent with tinea pedis.  Psychiatric: Patient smiles continuously.  He is a very poor historian and has difficulty communicating effectively.     ED Course  Procedures (including critical care time)  Labs Reviewed - No data to display Dg Hand Complete Left  04/19/2013   *RADIOLOGY REPORT*  Clinical Data: Finger pain  LEFT HAND - COMPLETE 3+ VIEW  Comparison: 03/25/2010  Findings: No fracture or dislocation is seen.  Distal acroosteolysis involving the second digit, mildly improved.  Soft tissue swelling  along the mid second digit, similar.  The joint spaces are essentially preserved.  No erosions are seen.  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  While nonspecific, this appearance can be seen with psoriasis.   Original Report Authenticated By: Charline Bills, M.D.   Dg Foot Complete Left  04/19/2013   *RADIOLOGY REPORT*  Clinical Data: Left foot pain/swelling  LEFT FOOT - COMPLETE 3+ VIEW  Comparison: None.  Findings: No fracture or dislocation is seen.  The joint spaces are essentially preserved.  No marginal erosions are seen.  The  visualized soft tissues are unremarkable.  IMPRESSION: No acute osseous abnormality is seen.   Original Report Authenticated By: Charline Bills, M.D.     1. Tinea pedis   2. 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  According to up to date may be associated with sclera derma or psoriasis.  Patient denies any rashes.  Will discharge patient with a prescription for Tylenol and Lotrimin.  [facility] as well as resource guide.        Arthor Captain, PA-C 04/19/13 1634

## 2013-04-19 NOTE — ED Notes (Signed)
Pt reports that he is here for a finger injury and foot injury.  Pt has flight of ideas, speaking slowly, giggling.

## 2013-04-19 NOTE — ED Notes (Signed)
Given bus ticket. 

## 2013-04-20 NOTE — ED Provider Notes (Signed)
Medical screening examination/treatment/procedure(s) were performed by non-physician practitioner and as supervising physician I was immediately available for consultation/collaboration.    Vida Roller, MD 04/20/13 [phone]

## 2013-05-11 ENCOUNTER — Emergency Department (HOSPITAL_COMMUNITY)
Admission: EM | Admit: 2013-05-11 | Discharge: 2013-05-11 | Disposition: A | Discharge status: Home / Self Care | Payer: Self-pay | Attending: Emergency Medicine | Admitting: Emergency Medicine

## 2013-05-11 ENCOUNTER — Encounter (HOSPITAL_COMMUNITY): Payer: Self-pay | Admitting: Emergency Medicine

## 2013-05-11 DIAGNOSIS — B353 Tinea pedis: Secondary | ICD-10-CM | POA: Insufficient documentation

## 2013-05-11 DIAGNOSIS — F17200 Nicotine dependence, unspecified, uncomplicated: Secondary | ICD-10-CM | POA: Insufficient documentation

## 2013-05-11 DIAGNOSIS — Z8739 Personal history of other diseases of the musculoskeletal system and connective tissue: Secondary | ICD-10-CM | POA: Insufficient documentation

## 2013-05-11 MED ORDER — IBUPROFEN 200 MG PO TABS
600.0000 mg | ORAL_TABLET | Freq: Once | ORAL | Status: AC
  Administered 2013-05-11: 600 mg via ORAL
  Filled 2013-05-11: qty 3

## 2013-05-11 MED ORDER — CLOTRIMAZOLE 1 % EX CREA
TOPICAL_CREAM | Freq: Two times a day (BID) | CUTANEOUS | Status: DC
  Administered 2013-05-11: 13:00:00 via TOPICAL
  Filled 2013-05-11: qty 15

## 2013-05-11 NOTE — ED Provider Notes (Signed)
Medical screening examination/treatment/procedure(s) were performed by non-physician practitioner and as supervising physician I was immediately available for consultation/collaboration.  Geoffery Lyons, MD 05/11/13 [phone]

## 2013-05-11 NOTE — ED Provider Notes (Signed)
History     CSN: [id number]  Arrival date & time 05/11/13  1126   First MD Initiated Contact with Patient 05/11/13 1203      Chief Complaint  Patient presents with  . Foot Pain    (Consider location/radiation/quality/duration/timing/severity/associated sxs/prior treatment) HPI  Colton Lee is a 47 year old male who has been homeless since 2009. He presents to the emergency department frequently with chief complaint of  left foot pain. The patient has a very strange affect. Is difficult to follow his speech. He speaks softly. Patient states that [place] the [place]" since [place] his home in 2009. He has been seen here in the emergency department for the same complaint several times. He complains of itching dry skin on the left sole of the foot. Patient has been ignoring his feet he reports because he has had other things to deal with.. He is missing the distal end of his digit. Patient states he "woke up with it." He is unsure how it occurred. He denies alcoholism or drug use, SI/HI or hallucinations.   Past Medical History  Diagnosis Date  . Foot pain     Past Surgical History  Procedure Laterality Date  . Hernia repair      No family history on file.  History  Substance Use Topics  . Smoking status: Current Every Day Smoker    Types: Cigarettes  . Smokeless tobacco: Not on file  . Alcohol Use: No      Review of Systems  Skin: Positive for rash.  All other systems reviewed and are negative.    Allergies  Aspirin and Tetanus toxoids  Home Medications   Current Outpatient Rx  Name  Route  Sig  Dispense  Refill  . acetaminophen (TYLENOL 8 HOUR) 650 MG CR tablet   Oral   Take 1 tablet (650 mg total) by mouth every 8 (eight) hours as needed for pain.   30 tablet   0   . clotrimazole (LOTRIMIN AF) 1 % cream      Please apply to the foot 2 times a day rubbing between the toes and coating the entire foot.   30 g   0     BP  128/69  Pulse 70  Temp(Src) 98.3 F (36.8 C) (Oral)  Resp 16  SpO2 100%  Physical Exam Nursing note and vitals reviewed.  Constitutional: He appears well-developed and well-nourished. No distress.  HENT:  Head: Normocephalic and atraumatic.  Eyes: Conjunctivae normal are normal. No scleral icterus.  Neck: Normal range of motion. Neck supple.  Cardiovascular: Normal rate, regular rhythm and normal heart sounds.  Pulmonary/Chest: Effort normal and breath sounds normal. No respiratory distress.  Abdominal: Soft. There is no tenderness.  Musculoskeletal: He exhibits no edema. distal end of the left first finger is sclerosed. It appears to have been removed. There is a central scar. It is tender to palpation without heat or erythema or discharge.  Neurological: He is alert.  Skin: Skin is warm and dry. He is not diaphoretic. left foot with xerotic skin in moccasin distribution consistent with tinea pedis.  Psychiatric: Patient smiles continuously. He is a very poor historian and has difficulty communicating effectively.   ED Course  Procedures (including critical care time)  Labs Reviewed - No data to display No results found.   1. Tinea pedis       MDM  He has had xrays done at his last visit about two weeks ago. NO  new injury. Comparing note to previous, rash does not appear worse. Pt said he did not do prescribed treatment. Will give Lotrimin cream to patient in ED and PO Ibuprofen.  47 y.o.Colton Lee's evaluation in the Emergency Department is complete. It has been determined that no acute conditions requiring further emergency intervention are present at this time. The patient/guardian have been advised of the diagnosis and plan. We have discussed signs and symptoms that warrant return to the ED, such as changes or worsening in symptoms.  Vital signs are stable at discharge. Filed Vitals:   05/11/13 1147  BP: 128/69  Pulse: 70  Temp: 98.3 F (36.8 C)   Resp: 16    Patient/guardian has voiced understanding and agreed to follow-up with the PCP or specialist.        Dorthula Matas, PA-C 05/11/13 1237

## 2013-05-11 NOTE — Progress Notes (Signed)
[facility] has seen pt. Gave him a list of primary care resources, highlighting [facility] [place].

## 2013-05-11 NOTE — ED Notes (Signed)
Per patient, homeless, walks a lot-has a lot of blisters on both feet-states they are not open, just causing a lot of pain

## 2013-06-29 ENCOUNTER — Emergency Department (HOSPITAL_COMMUNITY)
Admission: EM | Admit: 2013-06-29 | Discharge: 2013-06-29 | Disposition: A | Discharge status: Home / Self Care | Payer: Self-pay | Attending: Emergency Medicine | Admitting: Emergency Medicine

## 2013-06-29 ENCOUNTER — Encounter (HOSPITAL_COMMUNITY): Payer: Self-pay | Admitting: Emergency Medicine

## 2013-06-29 DIAGNOSIS — Z5900 Homelessness unspecified: Secondary | ICD-10-CM | POA: Insufficient documentation

## 2013-06-29 DIAGNOSIS — B353 Tinea pedis: Secondary | ICD-10-CM | POA: Insufficient documentation

## 2013-06-29 DIAGNOSIS — F17200 Nicotine dependence, unspecified, uncomplicated: Secondary | ICD-10-CM | POA: Insufficient documentation

## 2013-06-29 MED ORDER — TOLNAFTATE 1 % EX CREA: TOPICAL_CREAM | Freq: Two times a day (BID) | CUTANEOUS | Status: DC

## 2013-06-29 MED ORDER — ACETAMINOPHEN 325 MG PO TABS
650.0000 mg | ORAL_TABLET | Freq: Once | ORAL | Status: AC
  Administered 2013-06-29: 650 mg via ORAL
  Filled 2013-06-29: qty 2

## 2013-06-29 MED ORDER — MICONAZOLE NITRATE 2 % EX CREA
TOPICAL_CREAM | Freq: Two times a day (BID) | CUTANEOUS | Status: DC
  Administered 2013-06-29: 1 via TOPICAL
  Filled 2013-06-29: qty 14
  Filled 2013-06-29: qty 28.4

## 2013-06-29 MED ORDER — LIDOCAINE-PRILOCAINE 2.5-2.5 % EX CREA
TOPICAL_CREAM | Freq: Once | CUTANEOUS | Status: AC
  Administered 2013-06-29: 1 via TOPICAL
  Filled 2013-06-29: qty 5

## 2013-06-29 NOTE — ED Notes (Signed)
Breakfast tray delivered

## 2013-06-29 NOTE — ED Notes (Signed)
Per EMS: [place] C/O swelling in his legs and feet. Pt appears homeless, states he has been having to walk a lot. Ax4, NAD. Vitals Stable 150/90, 96%, 83.

## 2013-06-29 NOTE — ED Provider Notes (Signed)
  CSN: [id number]     Arrival date & time 06/29/13  0116 History     First MD Initiated Contact with Patient 06/29/13 0141     Chief Complaint  Patient presents with  . Leg Swelling   HPI Colton Lee is a 47 y.o. male who is homeless, who presents with swelling in his legs and feet as chief complaint. Patient actually has more itching and burning between his toes. He says he walks a lot and his shoes are old. Symptoms of itching are severe, constant, ongoing has not taken the medicine for the   Past Medical History  Diagnosis Date  . Foot pain    Past Surgical History  Procedure Laterality Date  . Hernia repair     No family history on file. History  Substance Use Topics  . Smoking status: Current Every Day Smoker    Types: Cigarettes  . Smokeless tobacco: Not on file  . Alcohol Use: No    Review of Systems At least 10pt or greater review of systems completed and are negative except where specified in the HPI.  Allergies  Aspirin and Tetanus toxoids  Home Medications  No current outpatient prescriptions on file. BP 136/85  Pulse 88  Temp(Src) 97.9 F (36.6 C) (Oral)  Resp 18  SpO2 100% Physical Exam  Nursing notes reviewed.  Electronic medical record reviewed. VITAL SIGNS:   Filed Vitals:   06/29/13 0129 06/29/13 0737  BP: 149/77 136/85  Pulse: 83 88  Temp: 97.9 F (36.6 C)   TempSrc: Oral   Resp: 18 18  SpO2: 100% 100%   CONSTITUTIONAL: Awake, oriented, appears non-toxic HENT: Atraumatic, normocephalic, oral mucosa pink and moist, airway patent. Nares patent without drainage. External ears normal. EYES: Conjunctiva clear, EOMI, PERRLA NECK: Trachea midline, non-tender, supple CARDIOVASCULAR: Normal heart rate, Normal rhythm, No murmurs, rubs, gallops PULMONARY/CHEST: Clear to auscultation, no rhonchi, wheezes, or rales. Symmetrical breath sounds. Non-tender. ABDOMINAL: Non-distended, soft, non-tender - no rebound or guarding.  BS  normal. NEUROLOGIC: Non-focal, moving all four extremities, no gross sensory or motor deficits. EXTREMITIES: No clubbing, cyanosis, or edema. Red dry cracked skin in the interdigital regions of his feet. No lower extremity swelling. No cords palpated posterior aspect of his legs. No tenderness along the deep vein system of the lower extremities. SKIN: Warm, Dry, No erythema, No rash  ED Course   Procedures (including critical care time)  Labs Reviewed - No data to display No results found. 1. Tinea pedis   2. Homeless     MDM  Patient presents with tinea pedis. I suspect the patient also may be hungry as he is homeless. Feeding patient, treating his pain and we'll also treat his tinea pedis. No medical emergency at this time.  Jones Skene, MD 06/29/13 2325

## 2013-06-29 NOTE — ED Notes (Signed)
Pt states he does not need anything at the moment. Pt states he wants to rest. Pt will be discharged in the AM.

## 2013-06-29 NOTE — ED Notes (Signed)
MD instructed RN to move pt to Pod C, D/C pt at 0700 with vital signs.

## 2013-07-01 ENCOUNTER — Encounter (HOSPITAL_COMMUNITY): Payer: Self-pay | Admitting: *Deleted

## 2013-07-01 ENCOUNTER — Emergency Department (HOSPITAL_COMMUNITY)
Admission: EM | Admit: 2013-07-01 | Discharge: 2013-07-01 | Disposition: A | Discharge status: Home / Self Care | Payer: Self-pay | Attending: Emergency Medicine | Admitting: Emergency Medicine

## 2013-07-01 DIAGNOSIS — M25579 Pain in unspecified ankle and joints of unspecified foot: Secondary | ICD-10-CM | POA: Insufficient documentation

## 2013-07-01 DIAGNOSIS — F17200 Nicotine dependence, unspecified, uncomplicated: Secondary | ICD-10-CM | POA: Insufficient documentation

## 2013-07-01 DIAGNOSIS — G8929 Other chronic pain: Secondary | ICD-10-CM | POA: Insufficient documentation

## 2013-07-01 MED ORDER — HYDROCODONE-ACETAMINOPHEN 5-325 MG PO TABS
1.0000 | ORAL_TABLET | Freq: Once | ORAL | Status: AC
  Administered 2013-07-01: 1 via ORAL
  Filled 2013-07-01: qty 1

## 2013-07-01 NOTE — ED Provider Notes (Signed)
History  This chart was scribed for non-physician practitioner, Kyung Bacca PA-C, working with Derwood Kaplan, MD by Ardeen Jourdain, ED Scribe. This patient was seen in room WTR7/WTR7 and the patient's care was started at 2126.  CSN: [id number]     Arrival date & time 07/01/13  2029  First MD Initiated Contact with Patient 07/01/13 2126     Chief Complaint  Patient presents with  . Foot Pain    The history is provided by the patient. No language interpreter was used.    HPI Comments: Colton Lee is a 47 y.o. homeless male who presents to the Emergency Department complaining of bilateral foot pain.  Pt a poor historian and does not appear to understand most of my questions. States the pain began in 2012, is constant and aggravated by walking.  Associated w/ edema.  Denies pruritis and rash.  Per prior chart, he was evaluated in [facility] for same 2 days ago, diagnosed w/ tinea pedis and provided w/ miconazole and lidocaine cream.  Reports that these medications are not giving him relief.  He states he has been having frontal headache and "nerve pain all over." He attributes to foot pain.  He states he walks all day. He denies any recent falls or injuries. He denies any visual disturbances, diarrhea, nasal congestion, cough or other pain as associated symptoms.  Has had dizziness but reportedly d/t lack of food.    Past Medical History  Diagnosis Date  . Foot pain    Past Surgical History  Procedure Laterality Date  . Hernia repair     No family history on file. History  Substance Use Topics  . Smoking status: Current Every Day Smoker    Types: Cigarettes  . Smokeless tobacco: Not on file  . Alcohol Use: No    Review of Systems  All other systems reviewed and are negative.    Allergies  Aspirin and Tetanus toxoids  Home Medications  No current outpatient prescriptions on file.  Triage Vitals: BP 136/88  Pulse 88  Temp(Src) 98.8 F (37.1 C) (Oral)  Resp  20  SpO2 99%  Physical Exam  Nursing note and vitals reviewed. Constitutional: He is oriented to person, place, and time. He appears well-developed and well-nourished. No distress.  Poor hygiene.   HENT:  Head: Normocephalic and atraumatic.  No nasal congestion.  Mild frontal sinus, maxillary and ethmoid sinus ttp.   Eyes:  Normal appearance  Neck: Normal range of motion.  Cardiovascular: Normal rate, regular rhythm and intact distal pulses.   Pulmonary/Chest: Effort normal and breath sounds normal.  Musculoskeletal: Normal range of motion.  Mild, poorly demarcated, diffuse erythema and dry skin of feet, particularly interdigital spaces.  No other rash.  No obvious edema of feet or lower legs.  Feet/toes diffusely tender.  No tenderness of calves.  Fungal infection of toenails.  2+ DP pulse and distal sensation intact.    Neurological: He is alert and oriented to person, place, and time. No sensory deficit. Coordination normal.  CN 3-12 intact.  No nystagmus. 5/5 and equal upper and lower extremity strength.  No past pointing.     Skin: Skin is warm and dry. No rash noted.  Psychiatric: He has a normal mood and affect. His behavior is normal.  Patient is talking to himself and appears to be experiencing visual hallucinations.  Has difficultly comprehending my questions and says I'm asking things that social worker should be asking him.    ED Course  Procedures (including critical care time)  DIAGNOSTIC STUDIES: Oxygen Saturation is 99% on room air, normal by my interpretation.    COORDINATION OF CARE:  9:44 PM-Discussed treatment plan with pt at bedside and pt agreed to plan.    Labs Reviewed - No data to display No results found. 1. Pain, foot, chronic, unspecified laterality     MDM  47yo homeless M w/ no known PMH presents w/ chronic, non-traumatic, bilateral foot pain.  Diagnosed and treated for tinea pedis in ED 2 days ago but reports that these medications are not  helping.  He requests something for pain.  No signs of acute infectious process on exam.  He appears to be experiencing visual hallucinations and is talking to himself.  He reports that he is talking to the TV, has no psych history and is not suicidal nor homicidal.  Does not wish to speak with psychiatry.  He received one vicodin for pain w/ relief and had 2 sandwiches.  Referred to primary care.   I personally performed the services described in this documentation, which was scribed in my presence. The recorded information has been reviewed and is accurate.    Otilio Miu, PA-C 07/02/13 [phone]

## 2013-07-01 NOTE — ED Notes (Signed)
Pt continues to talk to persons in room.  denies SI/HI

## 2013-07-01 NOTE — ED Notes (Signed)
States he is having foot pain and hungry. Pt recently seen and treated for foot pain. Pt is homeless and walks a lot. Can not determine anything else although he states he does not feel good.

## 2013-07-01 NOTE — ED Notes (Signed)
MD at bedside. 

## 2013-07-05 NOTE — ED Provider Notes (Signed)
Medical screening examination/treatment/procedure(s) were performed by non-physician practitioner and as supervising physician I was immediately available for consultation/collaboration.  Jacquetta Polhamus, MD 07/05/13 0730 

## 2013-07-30 ENCOUNTER — Encounter (HOSPITAL_COMMUNITY): Payer: Self-pay | Admitting: Emergency Medicine

## 2013-07-30 ENCOUNTER — Emergency Department (HOSPITAL_COMMUNITY)
Admission: EM | Admit: 2013-07-30 | Discharge: 2013-07-30 | Disposition: A | Discharge status: Home / Self Care | Payer: Self-pay | Attending: Emergency Medicine | Admitting: Emergency Medicine

## 2013-07-30 DIAGNOSIS — M79609 Pain in unspecified limb: Secondary | ICD-10-CM | POA: Insufficient documentation

## 2013-07-30 DIAGNOSIS — F17200 Nicotine dependence, unspecified, uncomplicated: Secondary | ICD-10-CM | POA: Insufficient documentation

## 2013-07-30 DIAGNOSIS — M79671 Pain in right foot: Secondary | ICD-10-CM

## 2013-07-30 MED ORDER — HYDROCODONE-ACETAMINOPHEN 5-325 MG PO TABS
1.0000 | ORAL_TABLET | Freq: Once | ORAL | Status: AC
  Administered 2013-07-30: 1 via ORAL
  Filled 2013-07-30: qty 1

## 2013-07-30 NOTE — ED Notes (Signed)
Pt states his feet have been hurting him and itching for two weeks now, pt reports it hurts to walk. Pt's feet have an odor to them. Pt states he has never experienced something like this before.

## 2013-07-30 NOTE — ED Notes (Signed)
Pt. reports " athlete's foot " bilateral feet pain /" burning" with itching and odor for several weeks.

## 2013-07-30 NOTE — ED Provider Notes (Signed)
CSN: [id number]     Arrival date & time 07/30/13  2201 History  This chart was scribed for non-physician practitioner Wynetta Emery, PA-C working with Laray Anger, DO by Leone Payor, ED Scribe. This patient was seen in room [facility] and the patient's care was started at 2201.    Chief Complaint  Patient presents with  . Foot Pain    The history is provided by the patient. No language interpreter was used.   HPI Comments: Colton Lee is a 47 y.o. male who presents to the Emergency Department complaining of constant, unchanged bilateral foot pain that has been ongoing for 3 years. He has associated itching and an odor to both of the feet. Pt has intermittently used an Athlete's foot spray with mild relief. Pt has not tried any OTC pain medication. He denies any fever, wounds or numbness.  Very poor historian   Past Medical History  Diagnosis Date  . Foot pain    Past Surgical History  Procedure Laterality Date  . Hernia repair     No family history on file. History  Substance Use Topics  . Smoking status: Current Every Day Smoker    Types: Cigarettes  . Smokeless tobacco: Not on file  . Alcohol Use: No    Review of Systems A complete 10 system review of systems was obtained and all systems are negative except as noted in the HPI and PMH.  Allergies  Aspirin and Tetanus toxoids  Home Medications  No current outpatient prescriptions on file.  Triage Vitals: BP 141/89  Pulse 65  Temp(Src) 97.9 F (36.6 C) (Oral)  Resp 16  Ht 6\' 5"  (1.956 m)  SpO2 100% Physical Exam  Nursing note and vitals reviewed. Constitutional: He is oriented to person, place, and time. He appears well-developed and well-nourished. No distress.  HENT:  Head: Normocephalic.  Mouth/Throat: Oropharynx is clear and moist.  Eyes: Conjunctivae and EOM are normal. Pupils are equal, round, and reactive to light.  Neck: Normal range of motion.  Cardiovascular: Normal rate.    Pulmonary/Chest: Effort normal and breath sounds normal. No stridor. No respiratory distress. He has no wheezes. He has no rales. He exhibits no tenderness.  Abdominal: Soft. Bowel sounds are normal.  Musculoskeletal: Normal range of motion.  Neurological: He is alert and oriented to person, place, and time.  Skin: Skin is warm.  Skin to bilateral feet show no signs of bacterial or fungal infection. The soles of the feet have significant calluses bilaterally.  Psychiatric: He has a normal mood and affect.    ED Course  Procedures (including critical care time) DIAGNOSTIC STUDIES: Oxygen Saturation is 100% on RA, normal by my interpretation.    COORDINATION OF CARE: 11:00 PM Will prescribe pain medication and will refer to Podiatrist.  Discussed treatment plan with pt at bedside and pt agreed to plan.  Labs Review Labs Reviewed - No data to display Imaging Review No results found.  MDM  No diagnosis found.  Filed Vitals:   07/30/13 2208  BP: 141/89  Pulse: 65  Temp: 97.9 F (36.6 C)  TempSrc: Oral  Resp: 16  Height: 6\' 5"  (1.956 m)  SpO2: 100%     Colton Lee is a 47 y.o. male who is homeless, very poor historian with athlete's foot bilateral foot pain going on for 3 years. Physical exam shows no signs of infection, patient is wearing house slippers and he has significant calluses on bilateral feet. We'll give him pain  medication in the ED. I have also given him a podiatry referral. I have advised him that he will need to wear more supportive shoes so that the calluses do not worsen. Physical exam otherwise unremarkable his lungs are clear to auscultation patient appears well-hydrated and well-nourished, heart sounds are regular and there are no abdominal abnormalities or tenderness to palpation.  Medications  HYDROcodone-acetaminophen (NORCO/VICODIN) 5-325 MG per tablet 1 tablet (1 tablet Oral Given 07/30/13 2309)    Note: Portions of this report may have  been transcribed using voice recognition software. Every effort was made to ensure accuracy; however, inadvertent computerized transcription errors may be present    Wynetta Emery, PA-C 08/01/13 1735

## 2013-08-02 NOTE — ED Provider Notes (Signed)
Medical screening examination/treatment/procedure(s) were performed by non-physician practitioner and as supervising physician I was immediately available for consultation/collaboration.   Jaques Mineer M Avry Roedl, DO 08/02/13 2252 

## 2013-10-18 ENCOUNTER — Emergency Department (HOSPITAL_COMMUNITY): Payer: Self-pay

## 2013-10-18 ENCOUNTER — Encounter (HOSPITAL_COMMUNITY): Payer: Self-pay | Admitting: Emergency Medicine

## 2013-10-18 ENCOUNTER — Emergency Department (HOSPITAL_COMMUNITY)
Admission: EM | Admit: 2013-10-18 | Discharge: 2013-10-18 | Disposition: A | Discharge status: Home / Self Care | Payer: Self-pay | Attending: Emergency Medicine | Admitting: Emergency Medicine

## 2013-10-18 DIAGNOSIS — J069 Acute upper respiratory infection, unspecified: Secondary | ICD-10-CM | POA: Insufficient documentation

## 2013-10-18 DIAGNOSIS — Z8739 Personal history of other diseases of the musculoskeletal system and connective tissue: Secondary | ICD-10-CM | POA: Insufficient documentation

## 2013-10-18 DIAGNOSIS — F17200 Nicotine dependence, unspecified, uncomplicated: Secondary | ICD-10-CM | POA: Insufficient documentation

## 2013-10-18 NOTE — ED Notes (Signed)
Pt complains of a slight cough and chills for about three weeks

## 2013-10-18 NOTE — ED Provider Notes (Signed)
CSN: [id number]     Arrival date & time 10/18/13  1951 History   First MD Initiated Contact with Patient 10/18/13 2001     Chief Complaint  Patient presents with  . Chills   (Consider location/radiation/quality/duration/timing/severity/associated sxs/prior Treatment) The history is provided by the patient.  pt c/o subjective fever and non productive cough in the past week. +nasal congestion. No sore throat. No known ill contacts. Denies night sweats or wt loss. No sob. No chest pain. Cough episodic, non productive. Smoker. Denies hx asthma or lung disease.       Past Medical History  Diagnosis Date  . Foot pain    Past Surgical History  Procedure Laterality Date  . Hernia repair     History reviewed. No pertinent family history. History  Substance Use Topics  . Smoking status: Current Every Day Smoker    Types: Cigarettes  . Smokeless tobacco: Not on file  . Alcohol Use: No    Review of Systems  Constitutional: Negative for fever and chills.  HENT: Positive for congestion. Negative for sore throat.   Eyes: Negative for discharge and redness.  Respiratory: Positive for cough. Negative for shortness of breath.   Cardiovascular: Negative for chest pain and leg swelling.  Gastrointestinal: Negative for vomiting, abdominal pain and diarrhea.  Genitourinary: Negative for dysuria and flank pain.  Musculoskeletal: Negative for back pain and neck pain.  Skin: Negative for rash.  Neurological: Negative for headaches.  Hematological: Does not bruise/bleed easily.  Psychiatric/Behavioral: Negative for confusion.    Allergies  Aspirin and Tetanus toxoids  Home Medications  No current outpatient prescriptions on file. BP 141/85  Pulse 84  Temp(Src) 97.9 F (36.6 C) (Oral)  Resp 20  SpO2 99% Physical Exam  Nursing note and vitals reviewed. Constitutional: He is oriented to person, place, and time. He appears well-developed and well-nourished. No distress.  HENT:   Mouth/Throat: Oropharynx is clear and moist.  Mild nasal congestion.   Eyes: Conjunctivae are normal. Pupils are equal, round, and reactive to light. No scleral icterus.  Neck: Neck supple. No tracheal deviation present.  No stiffness or rigidity  Cardiovascular: Normal rate, regular rhythm, normal heart sounds and intact distal pulses.  Exam reveals no gallop and no friction rub.   No murmur heard. Pulmonary/Chest: Effort normal and breath sounds normal. No accessory muscle usage. No respiratory distress.  Non prod cough  Abdominal: Soft. He exhibits no distension. There is no tenderness.  Genitourinary:  No cva tenderness  Musculoskeletal: Normal range of motion. He exhibits no edema.  Lymphadenopathy:    He has no cervical adenopathy.  Neurological: He is alert and oriented to person, place, and time.  Skin: Skin is warm and dry. No rash noted. He is not diaphoretic.  Psychiatric: He has a normal mood and affect.    ED Course  Procedures (including critical care time) Dg Chest 2 View  10/18/2013   CLINICAL DATA:  Chest pain for 1 day.  Smoker.  EXAM: CHEST  2 VIEW  COMPARISON:  09/03/2012  FINDINGS: Mid thoracic spondylosis. Mild to moderate S-shaped thoracolumbar spine curvature. Midline trachea. Normal heart size and mediastinal contours. No pleural effusion or pneumothorax. Clear lungs.  IMPRESSION: No acute cardiopulmonary disease.   Electronically Signed   By: Jeronimo Greaves M.D.   On: 10/18/2013 20:54     EKG Interpretation   None       MDM  Cxr.  Reviewed nursing notes and prior charts for additional history.  Recheck no increased wob. Pt comfortable. Symptoms likely due to viral uri.  Discussed xr w pt.  Pt appears stable for d/c.      Suzi Roots, MD 10/18/13 [phone]

## 2013-12-31 ENCOUNTER — Emergency Department (HOSPITAL_COMMUNITY)
Admission: EM | Admit: 2013-12-31 | Discharge: 2014-01-01 | Disposition: A | Discharge status: Home / Self Care | Payer: Self-pay | Attending: Emergency Medicine | Admitting: Emergency Medicine

## 2013-12-31 ENCOUNTER — Encounter (HOSPITAL_COMMUNITY): Payer: Self-pay | Admitting: Emergency Medicine

## 2013-12-31 DIAGNOSIS — G8929 Other chronic pain: Secondary | ICD-10-CM | POA: Insufficient documentation

## 2013-12-31 DIAGNOSIS — M79609 Pain in unspecified limb: Secondary | ICD-10-CM | POA: Insufficient documentation

## 2013-12-31 DIAGNOSIS — F17200 Nicotine dependence, unspecified, uncomplicated: Secondary | ICD-10-CM | POA: Insufficient documentation

## 2013-12-31 DIAGNOSIS — M79673 Pain in unspecified foot: Secondary | ICD-10-CM

## 2013-12-31 MED ORDER — HYDROCODONE-ACETAMINOPHEN 5-325 MG PO TABS
2.0000 | ORAL_TABLET | Freq: Once | ORAL | Status: AC
  Administered 2013-12-31: 2 via ORAL
  Filled 2013-12-31: qty 2

## 2013-12-31 NOTE — ED Notes (Signed)
Per EMS, pt has complaining of chronic foot pain for 5 years.

## 2013-12-31 NOTE — ED Provider Notes (Signed)
CSN: [id number]     Arrival date & time 12/31/13  2247 History   This chart was scribed for Emilia BeckKaitlyn Morell Mears, PA working with Gwyneth SproutWhitney Plunkett, MD by Quintella ReichertMatthew Underwood, ED Scribe. This patient was seen in room TR08C/TR08C and the patient's care was started at 11:58 PM.  Chief Complaint  Patient presents with  . Foot Pain    The history is provided by the patient. No language interpreter was used.    HPI Comments: Colton Lee is a homeless 48 y.o. male who presents to the Emergency Department complaining of bilateral foot pain that has been ongoing for "a couple months."  Pt admits to prior h/o similar pain and states it has not changed.  He denies recent injuries.  Pain is worsened by walking.  It is not relieved by anything.  Pt has no other complaints at this time.   Past Medical History  Diagnosis Date  . Foot pain     Past Surgical History  Procedure Laterality Date  . Hernia repair      History reviewed. No pertinent family history.   History  Substance Use Topics  . Smoking status: Current Every Day Smoker    Types: Cigarettes  . Smokeless tobacco: Not on file  . Alcohol Use: No     Review of Systems  Musculoskeletal: Positive for arthralgias (feet).  All other systems reviewed and are negative.     Allergies  Aspirin and Tetanus toxoids  Home Medications   Current Outpatient Rx  Name  Route  Sig  Dispense  Refill  . Doxylamine Succinate, Sleep, (SLEEP AID PO)   Oral   Take 1 tablet by mouth at bedtime as needed.          BP 134/74  Pulse 85  Temp(Src) 98.4 F (36.9 C) (Oral)  Resp 16  SpO2 97%  Physical Exam  Nursing note and vitals reviewed. Constitutional: He is oriented to person, place, and time. He appears well-developed and well-nourished. No distress.  HENT:  Head: Normocephalic and atraumatic.  Eyes: EOM are normal.  Neck: Neck supple. No tracheal deviation present.  Cardiovascular: Normal rate.   Pulmonary/Chest:  Effort normal. No respiratory distress.  Musculoskeletal: Normal range of motion.  No obvious deformity to feet  Neurological: He is alert and oriented to person, place, and time.  Skin: Skin is warm and dry.  Psychiatric: He has a normal mood and affect. His behavior is normal.    ED Course  Procedures (including critical care time)  DIAGNOSTIC STUDIES: Oxygen Saturation is 97% on room air, normal by my interpretation.    COORDINATION OF CARE: 12:00 AM-Discussed treatment plan which includes pain medication with pt at bedside and pt agreed to plan.     Labs Review Labs Reviewed - No data to display  Imaging Review No results found.  EKG Interpretation   None       MDM   Final diagnoses:  None  1. Chronic foot pain  12:04 AM Patient has a history of chronic foot pain. He denies injury or new symptoms. Vitals stable and patient afebrile. Patient given vicodin here and will be discharged with a short course of vicodin prescription. Patient will have a resource guide for recommended follow up.     I personally performed the services described in this documentation, which was scribed in my presence. The recorded information has been reviewed and is accurate.    Emilia BeckKaitlyn Janelle Spellman, PA-C 01/01/14 0005

## 2013-12-31 NOTE — ED Notes (Signed)
Pt reports that he has foot pain since 2010 and has seen multiple doctors and no one does anything for him. He reports that "he is lucky to be alive". Pt reports that he wants to only remove sock and shoes once.

## 2014-01-01 MED ORDER — HYDROCODONE-ACETAMINOPHEN 5-325 MG PO TABS: 2.0000 | ORAL_TABLET | ORAL | Status: DC | PRN

## 2014-01-01 NOTE — ED Notes (Signed)
PT sleeping in W/C but opens eyes when name is called.

## 2014-01-01 NOTE — ED Notes (Signed)
Attempted to discharge patient out without any success. Patient is unable to stay awake.

## 2014-01-01 NOTE — ED Provider Notes (Signed)
Medical screening examination/treatment/procedure(s) were performed by non-physician practitioner and as supervising physician I was immediately available for consultation/collaboration.    Sunnie NielsenBrian Elizbeth Posa, MD 01/01/14 [phone]

## 2014-01-01 NOTE — ED Notes (Signed)
PT contineus to sleep soundly opens eyes when name is called.

## 2014-01-01 NOTE — Discharge Instructions (Signed)
Take vicodin as needed for pain. Follow up with a doctor from the resource guide. Return to the ED with worsening or concerning symptoms.    Emergency Department Resource Guide 1) Find a Doctor and Pay Out of Pocket Although you won't have to find out who is covered by your insurance plan, it is a good idea to ask around and get recommendations. You will then need to call the office and see if the doctor you have chosen will accept you as a new patient and what types of options they offer for patients who are self-pay. Some doctors offer discounts or will set up payment plans for their patients who do not have insurance, but you will need to ask so you aren't surprised when you get to your appointment.  2) Contact Your Local Health Department Not all health departments have doctors that can see patients for sick visits, but many do, so it is worth a call to see if yours does. If you don't know where your local health department is, you can check in your phone book. The CDC also has a tool to help you locate your state's health department, and many state websites also have listings of all of their local health departments.  3) Find a Walk-in Clinic If your illness is not likely to be very severe or complicated, you may want to try a walk in clinic. These are popping up all over the country in pharmacies, drugstores, and shopping centers. They're usually staffed by nurse practitioners or physician assistants that have been trained to treat common illnesses and complaints. They're usually fairly quick and inexpensive. However, if you have serious medical issues or chronic medical problems, these are probably not your best option.  No Primary Care Doctor: - Call Health Connect at  [phone] - they can help you locate a primary care doctor that  accepts your insurance, provides certain services, etc. - Physician Referral Service- [phone]  Chronic Pain Problems: Organization          Address  Phone   Notes  [facility]  [phone] Patients need to be referred by their primary care doctor.   Medication Assistance: Organization         Address  Phone   Notes  [facility] [address] [place], [place] [address] [phone] --Must be a resident of [facility] -- Must have NO insurance coverage whatsoever (no Medicaid/ Medicare, etc.) -- The pt. MUST have a primary care doctor that directs their care regularly and follows them in the community   Med[facility]ist  [phone]   [employer]  [phone]    Agencies that provide inexpensive medical care: Organization         Address  Phone   Notes  [facility]  [phone]   [facility]    [phone]   [facility] [address] [place], [place] [address] [phone]   [facility] [address], [place] [phone]   [facility]    [phone]   [facility]    [phone]   [facility]  [address] [place] Phone:  [phone], Fax:  [phone]) [phone] Hours of Operation:  9 am - 6 pm, M-F.  Also accepts Medicaid/Medicare and self-pay.  [facility]  [address], [place]  Phone: [phone], Fax: [phone]. Hours of Operation:  8:30 am - 5:30 pm, M-F.  Also accepts Medicaid and self-pay.  °[facility] [place] [address], [place] Phone: [phone]) [phone]   °[facility] [address], [place], [place] [phone], Ext. 123 Mondays & Thursdays: 7-9 AM.  First 15 patients are seen on a first come, first serve basis. °  ° °Medicaid-accepting [place] Providers: ° °Organization         Address  Phone   Notes  °[facility] [address], [place] [phone] Also accepts self-pay patients.  °[facility] [address], [place] ° ([phone]) [phone]   °[facility] [address], [place] [phone]) [phone]   °[facility] [address] [place] [address], [place] [phone]) [phone]   °Veita Bland [address], [place]  ° [phone]) [phone] Only accepts [place] Access Medicaid patients after they have their name applied to their card.  ° °Self-Pay (no insurance) in [place]: ° °Organization         Address  Phone   Notes  °Sickle Cell Patients, [facility] [address], [place] [phone]) [phone]   °[facility] [address], [place] ([phone]) [phone]   °[facility] ° [address], [place] [phone]) [phone]   °[facility]/Dr. Osei-Bonsu ° [address] [place] [address], [place] or [address], [place] [phone] Phone number for both [place] and [place] locations is the same.  °[facility] [address], [place] [phone]) [phone]   °[facility] [address] [place] [address], [place] or [address] ([phone]) [phone] °(336) [phone]   °[facility] [address], [place] (336) [phone], phone; [phone], fax Sees patients 1st and 3rd Saturday of every month.  Must not qualify for public or private insurance (i.e. Medicaid, Medicare, [place] Health Choice, Veterans' Benefits) • Household income should be no more than 200% of the poverty level •The clinic cannot treat you if you are pregnant or think you are pregnant • Sexually transmitted diseases are not treated at the clinic.  ° ° °Dental Care: °Organization         Address  Phone  Notes  °[place] [facility] [address], [place] [phone]) [phone] Accepts children up to age 21 who are enrolled in Medicaid or [place] Health Choice; pregnant women with a Medicaid card; and  children who have applied for Medicaid or [place] Health Choice, but were declined, whose parents can pay a reduced fee at time of service.  °[place] [facility] [place]  [address], [place] [phone]) [phone] Accepts children up to age 21 who are enrolled in Medicaid or [place] Health Choice; pregnant women with a Medicaid card; and children who have applied for Medicaid or  Health Choice, but were declined, whose parents can pay a reduced fee at time of service.  °[facility] ° [address],  ([phone]) [phone] Patients are seen by appointment only. Walk-ins are not accepted. [facility] will see patients 18 years of age and older. °Monday - Tuesday (8am-5pm) °Most Wednesdays (8:30-5pm) °$30 per visit, cash only  °[facility] ° [address], [facility] [phone] Patients are seen by appointment only. Walk-ins are not accepted. [place] will see patients 23 years of age and older. One Wednesday Evening (Monthly: Volunteer Based).  $30 per visit, cash only  [place]  [phone] for adults; Children under age 68, call Graduate Pediatric Dentistry at [phone]. Children aged 23-14, please call [phone] to request a pediatric application.  Dental services are provided in all areas of dental care including fillings, crowns and bridges, complete and partial dentures, implants, gum treatment, root canals, and extractions. Preventive care is also provided. Treatment is provided to both adults and children. Patients are selected via a lottery and there is often a waiting list.   [facility] [address] [place]  [phone] [email or web address]   [facility] [address] [place], [place] [phone], Ext. [phone] Second and Fourth Thursday of each month, opens at 6:30 AM; Clinic ends at 9 AM.  Patients are seen on a first-come first-served  basis, and a limited number are seen during each clinic.   [facility]  [address] Hillard Danker [place], [place] [phone]   Eligibility Requirements You must have lived in [place], [place], or [place] counties for at least the last three months.   You cannot be eligible for state or federal sponsored [employer], including [employer], [place], or [employer].   You generally cannot be eligible for healthcare insurance through your employer.    How to apply: Eligibility screenings are held every Tuesday and Wednesday afternoon from 1:00 pm until 4:00 pm. You do not need an appointment for the interview!  [facility] [address], [place], [place]   [place]  [place]  [phone]    Behavioral Health Resources in the Community: Intensive Outpatient Programs Organization         Address  Phone  Notes  [facility] [address], [place], [place] [phone]   [facility] [address] [place], [place]   [facility] [address] [place], [place]   [place] [address]  [place], [place] or [phone]   Substance Abuse Resources Organization         Address  Phone  Notes  [facility]  [phone]) [phone]   [place]  (541) [phone]   [facility] [place]   Chinita Pester  ([phone]   Residential & Outpatient Substance Abuse Program  [phone])[phone]   Psychological Services Organization         Address  Phone  Notes  [facility] [place]  [place]  (604) [phone]   [place] [address], [place] or [phone]    Mobile Crisis Teams Organization          Address  Phone  Notes  [facility], Mobile Crisis Care Unit  [phone]   [facility]  [address] [place] [employer], [place]   [place] [address] [place] [phone]    Self-Help/Support Groups Organization         Address  Phone             Notes  [place] [facility] [place] - variety of  support groups  336- [phone] Call for more information  °Narcotics Anonymous (NA), [facility] [address], °[place]  2 meetings at this location  ° °Residential Treatment Programs °Organization         Address  Phone  Notes  °[facility] [address],    °[place]  [phone]   °[facility] ° [address], [place], [place] [phone]   °[facility] [address], [place] [phone] Admissions: 8am-3pm M-F  °[facility] [address]    °[place], [place] [phone]   °[facility] [address], [place], [place] [phone]   °[facility] [address]  °[place], [place] [phone]   °[facility] - Intensive Outpatient [address], [place], [place] [phone]   °[facility] [address]  °[place], [place] 1-877-615-2722 or [phone]   °[facility] ([facility]) [address] [place], [place] [phone] Accepts Medicaid  °[facility] [address]  °[place] 1-800-659-3381 Substance Abuse/Addiction Treatment  ° °[place] Behavioral Health Resources °Organization         Address  Phone  Notes  °[facility]  (888) [phone]   °Julie Brannon, PhD [address] [place], [place]   (336) [phone] or (336) [phone]   °[facility]   [address] °[place], [place] (336) 349-4454   °[facility] [address], [place], [place] (336) [phone] Insurance/Medicaid/sponsorship  through [facility]  °[facility] [address]                                    [place], [place] (336) [phone] Therapy/tele-psych/case  °[facility] [address]  ° [place], [place] (336) [phone]    °Dr. Arfeen  (336) [phone]   °[facility] [place]  [employer] [place] [facility] 1) [address], [place] °2) [address], [place] °3)  [address], [place] (336) 349-3220 °(336) [phone] ° °(336) [phone]   °[place] Child Abuse Hotline (336) 342-1394 or (336) [phone] (After Hours)    ° ° ° °

## 2014-01-11 ENCOUNTER — Emergency Department (HOSPITAL_COMMUNITY)
Admission: EM | Admit: 2014-01-11 | Discharge: 2014-01-11 | Disposition: A | Discharge status: Home / Self Care | Payer: Self-pay | Attending: Emergency Medicine | Admitting: Emergency Medicine

## 2014-01-11 ENCOUNTER — Encounter (HOSPITAL_COMMUNITY): Payer: Self-pay | Admitting: Emergency Medicine

## 2014-01-11 DIAGNOSIS — M79609 Pain in unspecified limb: Secondary | ICD-10-CM | POA: Insufficient documentation

## 2014-01-11 DIAGNOSIS — M79673 Pain in unspecified foot: Secondary | ICD-10-CM

## 2014-01-11 DIAGNOSIS — F17200 Nicotine dependence, unspecified, uncomplicated: Secondary | ICD-10-CM | POA: Insufficient documentation

## 2014-01-11 DIAGNOSIS — F39 Unspecified mood [affective] disorder: Secondary | ICD-10-CM

## 2014-01-11 LAB — COMPREHENSIVE METABOLIC PANEL
ALBUMIN: 3.7 g/dL (ref 3.5–5.2)
ALT: 16 U/L (ref 0–53)
AST: 20 U/L (ref 0–37)
Alkaline Phosphatase: 64 U/L (ref 39–117)
BUN: 18 mg/dL (ref 6–23)
CO2: 24 mEq/L (ref 19–32)
CREATININE: 1.13 mg/dL (ref 0.50–1.35)
Calcium: 9.2 mg/dL (ref 8.4–10.5)
Chloride: 101 mEq/L (ref 96–112)
GFR calc non Af Amer: 76 mL/min — ABNORMAL LOW (ref >90)
GFR, EST AFRICAN AMERICAN: 88 mL/min — AB (ref >90)
GLUCOSE: 124 mg/dL — AB (ref 70–99)
Potassium: 3.5 mEq/L — ABNORMAL LOW (ref 3.7–5.3)
Sodium: 139 mEq/L (ref 137–147)
TOTAL PROTEIN: 6.8 g/dL (ref 6.0–8.3)
Total Bilirubin: 0.3 mg/dL (ref 0.3–1.2)

## 2014-01-11 LAB — RAPID URINE DRUG SCREEN, HOSP PERFORMED
Amphetamines: NOT DETECTED
BARBITURATES: NOT DETECTED
BENZODIAZEPINES: NOT DETECTED
COCAINE: NOT DETECTED
Opiates: NOT DETECTED
Tetrahydrocannabinol: NOT DETECTED

## 2014-01-11 LAB — CBC
HEMATOCRIT: 37.5 % — AB (ref 39.0–52.0)
HEMOGLOBIN: 12.2 g/dL — AB (ref 13.0–17.0)
MCH: 26.8 pg (ref 26.0–34.0)
MCHC: 32.5 g/dL (ref 30.0–36.0)
MCV: 82.4 fL (ref 78.0–100.0)
Platelets: 186 10*3/uL (ref 150–400)
RBC: 4.55 MIL/uL (ref 4.22–5.81)
RDW: 14.6 % (ref 11.5–15.5)
WBC: 4.3 10*3/uL (ref 4.0–10.5)

## 2014-01-11 LAB — ACETAMINOPHEN LEVEL: Acetaminophen (Tylenol), Serum: <15 ug/mL (ref 10–30)

## 2014-01-11 LAB — SALICYLATE LEVEL

## 2014-01-11 LAB — ETHANOL

## 2014-01-11 MED ORDER — ACETAMINOPHEN 325 MG PO TABS: 650.0000 mg | ORAL_TABLET | ORAL | Status: DC | PRN

## 2014-01-11 MED ORDER — ONDANSETRON HCL 4 MG PO TABS: 4.0000 mg | ORAL_TABLET | Freq: Three times a day (TID) | ORAL | Status: DC | PRN

## 2014-01-11 MED ORDER — NICOTINE 21 MG/24HR TD PT24
21.0000 mg | MEDICATED_PATCH | Freq: Every day | TRANSDERMAL | Status: DC
  Administered 2014-01-11: 21 mg via TRANSDERMAL
  Filled 2014-01-11: qty 1

## 2014-01-11 MED ORDER — ALUM & MAG HYDROXIDE-SIMETH 200-200-20 MG/5ML PO SUSP: 30.0000 mL | ORAL | Status: DC | PRN

## 2014-01-11 MED ORDER — ZOLPIDEM TARTRATE 5 MG PO TABS: 5.0000 mg | ORAL_TABLET | Freq: Every evening | ORAL | Status: DC | PRN

## 2014-01-11 MED ORDER — LORAZEPAM 2 MG/ML IJ SOLN
2.0000 mg | Freq: Once | INTRAMUSCULAR | Status: DC
  Filled 2014-01-11: qty 1

## 2014-01-11 MED ORDER — LORAZEPAM 1 MG PO TABS: 1.0000 mg | ORAL_TABLET | Freq: Three times a day (TID) | ORAL | Status: DC | PRN

## 2014-01-11 MED ORDER — HALOPERIDOL LACTATE 5 MG/ML IJ SOLN
10.0000 mg | Freq: Once | INTRAMUSCULAR | Status: DC
  Filled 2014-01-11: qty 2

## 2014-01-11 NOTE — ED Notes (Signed)
Pt sleeping. Respirations even and unlabored. Bilateral rise and fall of chest. Skin warm and dry. In no acute distress. Denies needs.

## 2014-01-11 NOTE — ED Provider Notes (Signed)
Patient vehemently denies that he wants to harm himself or others. He is alert and ambulatory. Appears calm. I did not feel the patient is a danger to himself or to others. He is going directly from here to the [facility] emergency shelters. Results for orders placed during the hospital encounter of 01/11/14  ACETAMINOPHEN LEVEL      Result Value Ref Range   Acetaminophen (Tylenol), Serum <15.0  10 - 30 ug/mL  CBC      Result Value Ref Range   WBC 4.3  4.0 - 10.5 K/uL   RBC 4.55  4.22 - 5.81 MIL/uL   Hemoglobin 12.2 (*) 13.0 - 17.0 g/dL   HCT 16.137.5 (*) 09.639.0 - 04.552.0 %   MCV 82.4  78.0 - 100.0 fL   MCH 26.8  26.0 - 34.0 pg   MCHC 32.5  30.0 - 36.0 g/dL   RDW 40.914.6  81.111.5 - 91.415.5 %   Platelets 186  150 - 400 K/uL  COMPREHENSIVE METABOLIC PANEL      Result Value Ref Range   Sodium 139  137 - 147 mEq/L   Potassium 3.5 (*) 3.7 - 5.3 mEq/L   Chloride 101  96 - 112 mEq/L   CO2 24  19 - 32 mEq/L   Glucose, Bld 124 (*) 70 - 99 mg/dL   BUN 18  6 - 23 mg/dL   Creatinine, Ser 7.821.13  0.50 - 1.35 mg/dL   Calcium 9.2  8.4 - 95.610.5 mg/dL   Total Protein 6.8  6.0 - 8.3 g/dL   Albumin 3.7  3.5 - 5.2 g/dL   AST 20  0 - 37 U/L   ALT 16  0 - 53 U/L   Alkaline Phosphatase 64  39 - 117 U/L   Total Bilirubin 0.3  0.3 - 1.2 mg/dL   GFR calc non Af Amer 76 (*) >90 mL/min   GFR calc Af Amer 88 (*) >90 mL/min  ETHANOL      Result Value Ref Range   Alcohol, Ethyl (B) <11  0 - 11 mg/dL  SALICYLATE LEVEL      Result Value Ref Range   Salicylate Lvl <2.0 (*) 2.8 - 20.0 mg/dL  URINE RAPID DRUG SCREEN (HOSP PERFORMED)      Result Value Ref Range   Opiates NONE DETECTED  NONE DETECTED   Cocaine NONE DETECTED  NONE DETECTED   Benzodiazepines NONE DETECTED  NONE DETECTED   Amphetamines NONE DETECTED  NONE DETECTED   Tetrahydrocannabinol NONE DETECTED  NONE DETECTED   Barbiturates NONE DETECTED  NONE DETECTED   No results found.   Doug SouSam Darrelle Barrell, MD 01/11/14 [id number]

## 2014-01-11 NOTE — ED Notes (Signed)
Bed: [id number] Expected date:  Expected time:  Means of arrival:  Comments: EMS/feet cold, red and burning-outside

## 2014-01-11 NOTE — Consult Note (Signed)
Colton Lee   Reason for Lee:  Mood d/o Referring Physician:  EDP Roderic Scarce Colton Lee is an 48 y.o. male. Total Time spent with patient: 30 minutes  Assessment: AXIS I:  Mood d/o AXIS II:  Deferred AXIS III:   Past Medical History  Diagnosis Date  . Foot pain    AXIS IV:  housing problems, occupational problems, other psychosocial or environmental problems, problems related to social environment and problems with primary support group AXIS V:  51-60 moderate symptoms  Plan:  No evidence of imminent risk to self or others at present.    Subjective:   Colton Lee is a 48 y.o. male patient was evaluated for mood d/o.  HPI:   Psychiatry was Called this am to see this AA male who is homeless, acting bizarre and not answering questions.  Patient was evaluated by Dr De Nurse and this Probation officer.  Patient was calm, cooperative and willingly answered our questions.  Patient is well known to the ER staff who stated that patient  Comes in for foot pain and blisters after walking for a long time.  This am, after been seen by the SW for  a pair of shoes was determined to be responding to internal stimuli.  She also said patient had thought blocking and was not fort coming with answers to her questions.  During our encounter with patient, he was polite, cooperative and answered our questions.  Patient reported he is homeless and has been walking around because he lost his car.  Patient also reported that he now has blisters under his feet steaming from too much walking.  He denied SI/HI/AVH.  He stated that he came in due to walking under a severe cold weather with torn shoes.  Patient throughout our evaluation was not responding to internal stimuli.  He will be kept overnight for observation and will be re-assed by the Psychiatrist tomorrow.  Patient denies previous Psychiatric diagnosis.  HPI Elements:   Location:  Mood d/o, . Quality:  continuous feet  pain. Severity:  moderate. Context:  WALKING UNDER COLD WEATHER ON FOOT.  Past Psychiatric History: Past Medical History  Diagnosis Date  . Foot pain     reports that he has been smoking Cigarettes.  He has been smoking about 0.00 packs per day. He does not have any smokeless tobacco history on file. He reports that he drinks alcohol. He reports that he does not use illicit drugs. History reviewed. No pertinent family history. Family History Substance Abuse: No (UDS not completed at this time) Family Supports: No (unknown at this time) Living Arrangements: Other (Comment) (homeless) Can pt return to current living arrangement?: Yes   Allergies:   Allergies  Allergen Reactions  . Aspirin Itching  . Tetanus Toxoids Hives    ACT Assessment Complete:  Yes:    Educational Status    Risk to Self: Risk to self Suicidal Ideation: No (Unable to assess Pt has thought blocking, no direct answers) Is patient at risk for suicide?: No (No apparent risk at this time) Suicidal Plan?: No Access to Means: No What has been your use of drugs/alcohol within the last 12 months?: no (Denies at this time) Previous Attempts/Gestures: No Intentional Self Injurious Behavior: None Family Suicide History: Unable to assess (Pt is avasive when answering questions) Recent stressful life event(s): Loss (Comment);Financial Problems;Other (Comment) (homelessness, lack of supports) Persecutory voices/beliefs?: No Substance abuse history and/or treatment for substance abuse?: No (unknown at this time ) Suicide prevention  information given to non-admitted patients: Not applicable  Risk to Others: Risk to Others Homicidal Ideation: No (Pt present with some thought blocking, unable to assess) Thoughts of Harm to Others: No (Pt gave various reason not to harm others) Current Homicidal Intent: No Current Homicidal Plan: No Access to Homicidal Means: No History of harm to others?: No Assessment of Violence: None  Noted Does patient have access to weapons?: No Criminal Charges Pending?: No Does patient have a court date: No  Abuse:    Prior Inpatient Therapy: Prior Inpatient Therapy Prior Inpatient Therapy: No (Pt as a teen was seen for decreased sleeping)  Prior Outpatient Therapy: Prior Outpatient Therapy Prior Outpatient Therapy: No (Pt denies)  Additional Information: Additional Information 1:1 In Past 12 Months?: No CIRT Risk: No Elopement Risk: No Does patient have medical clearance?: Yes                  Objective: Blood pressure 143/94, pulse 75, temperature 97.3 F (36.3 C), temperature source Oral, resp. rate 17, SpO2 98.00%.There is no weight on file to calculate BMI. Results for orders placed during the hospital encounter of 01/11/14 (from the past 72 hour(s))  ACETAMINOPHEN LEVEL     Status: None   Collection Time    01/11/14  1:10 PM      Result Value Ref Range   Acetaminophen (Tylenol), Serum <15.0  10 - 30 ug/mL   Comment:            THERAPEUTIC CONCENTRATIONS VARY     SIGNIFICANTLY. A RANGE OF 10-30     ug/mL MAY BE AN EFFECTIVE     CONCENTRATION FOR MANY PATIENTS.     HOWEVER, SOME ARE BEST TREATED     AT CONCENTRATIONS OUTSIDE THIS     RANGE.     ACETAMINOPHEN CONCENTRATIONS     >150 ug/mL AT 4 HOURS AFTER     INGESTION AND >50 ug/mL AT 12     HOURS AFTER INGESTION ARE     OFTEN ASSOCIATED WITH TOXIC     REACTIONS.  CBC     Status: Abnormal   Collection Time    01/11/14  1:10 PM      Result Value Ref Range   WBC 4.3  4.0 - 10.5 K/uL   RBC 4.55  4.22 - 5.81 MIL/uL   Hemoglobin 12.2 (*) 13.0 - 17.0 g/dL   HCT 37.5 (*) 39.0 - 52.0 %   MCV 82.4  78.0 - 100.0 fL   MCH 26.8  26.0 - 34.0 pg   MCHC 32.5  30.0 - 36.0 g/dL   RDW 14.6  11.5 - 15.5 %   Platelets 186  150 - 400 K/uL  COMPREHENSIVE METABOLIC PANEL     Status: Abnormal   Collection Time    01/11/14  1:10 PM      Result Value Ref Range   Sodium 139  137 - 147 mEq/L   Potassium 3.5 (*)  3.7 - 5.3 mEq/L   Chloride 101  96 - 112 mEq/L   CO2 24  19 - 32 mEq/L   Glucose, Bld 124 (*) 70 - 99 mg/dL   BUN 18  6 - 23 mg/dL   Creatinine, Ser 1.13  0.50 - 1.35 mg/dL   Calcium 9.2  8.4 - 10.5 mg/dL   Total Protein 6.8  6.0 - 8.3 g/dL   Albumin 3.7  3.5 - 5.2 g/dL   AST 20  0 - 37 U/L   ALT 16  0 - 53 U/L   Alkaline Phosphatase 64  39 - 117 U/L   Total Bilirubin 0.3  0.3 - 1.2 mg/dL   GFR calc non Af Amer 76 (*) >90 mL/min   GFR calc Af Amer 88 (*) >90 mL/min   Comment: (NOTE)     The eGFR has been calculated using the CKD EPI equation.     This calculation has not been validated in all clinical situations.     eGFR's persistently <90 mL/min signify possible Chronic Kidney     Disease.  ETHANOL     Status: None   Collection Time    01/11/14  1:10 PM      Result Value Ref Range   Alcohol, Ethyl (B) <11  0 - 11 mg/dL   Comment:            LOWEST DETECTABLE LIMIT FOR     SERUM ALCOHOL IS 11 mg/dL     FOR MEDICAL PURPOSES ONLY  SALICYLATE LEVEL     Status: Abnormal   Collection Time    01/11/14  1:10 PM      Result Value Ref Range   Salicylate Lvl <8.4 (*) 2.8 - 20.0 mg/dL   Labs are reviewed and are pertinent for Unremarkable.  Current Facility-Administered Medications  Medication Dose Route Frequency Provider Last Rate Last Dose  . haloperidol lactate (HALDOL) injection 10 mg  10 mg Intramuscular Once [employer], DO      . LORazepam (ATIVAN) injection 2 mg  2 mg Intramuscular Once Kristen N Ward, DO       No current outpatient prescriptions on file.   Reviewed Physical examination performed by EDP 01/11/2014 and found nothing significant  Psychiatric Specialty Exam:     Blood pressure 143/94, pulse 75, temperature 97.3 F (36.3 C), temperature source Oral, resp. rate 17, SpO2 98.00%.There is no weight on file to calculate BMI.  General Appearance: Casual and Disheveled  Eye Contact::  Good  Speech:  Clear and Coherent and Normal Rate  Volume:  Normal   Mood:  Euthymic  Affect:  Congruent  Thought Process:  Coherent and Goal Directed  Orientation:  Full (Time, Place, and Person)  Thought Content:  NA  Suicidal Thoughts:  No  Homicidal Thoughts:  No  Memory:  Immediate;   Good Recent;   Good Remote;   Good  Judgement:  Fair  Insight:  Fair  Psychomotor Activity:  Normal  Concentration:  Good  Recall:  NA  Fund of Knowledge:Good  Language: Good  Akathisia:  NA  Handed:  Right  AIMS (if indicated):     Assets:  Desire for Improvement  Sleep:      Musculoskeletal: Strength & Muscle Tone: within normal limits Gait & Station: normal Patient leans: N/A  Treatment Plan Summary:  Lee and face to face interview with Dr De Nurse We will keep patient overnight and treat his feet pain. We will keep patient overnight and will re-evaluate him in am and determine appropriate disposition.  Delfin Gant   PMHNP-BC 01/11/2014 2:48 PM I have personally seen the patient and agreed with the findings and involved in the treatment plan. Was acting bizarre at admission and concern has been safety, get and review labs and determine disposition. He has calmed down but states to be homeless. Social services to obtain more collateral information. If remains calm and non bizarre can be discharged tomorrow as he does not want to be hospitalized. Merian Capron, MD

## 2014-01-11 NOTE — ED Notes (Signed)
Went in to see patient.  Pt was not found in room.  Pt found in lobby leaving hospital.  Pt encouraged to return to room.  Dr. Elesa MassedWard went to see patient and stated that psychology wanted patient admitted overnight.  Psychology notified and came to see patient.  Agreement made to IVC.  Psychology doing paperwork.  [employer] at bedside speaking with patient and attempting to calm.  Pt very upset that he is having to stay.  Wants to go home and is not understanding why he is remaining here.  Pending IVC at this moment.

## 2014-01-11 NOTE — Discharge Instructions (Signed)
[facility] so that you have a place to stay tonight. Return if your condition worsens for any reason. If you feel that you want to harm herself or others call 911 immediately

## 2014-01-11 NOTE — ED Provider Notes (Signed)
CSN: [id number]     Arrival date & time 01/11/14  0034 History   First MD Initiated Contact with Patient 01/11/14 [phone]     Chief Complaint  Patient presents with  . Cold Exposure     (Consider location/radiation/quality/duration/timing/severity/associated sxs/prior Treatment) Patient is a 48 y.o. male presenting with lower extremity pain. The history is provided by the patient.  Foot Pain This is a chronic problem. The current episode started more than 1 week ago. The problem occurs constantly. The problem has not changed since onset.Pertinent negatives include no chest pain, no abdominal pain, no headaches and no shortness of breath. Nothing aggravates the symptoms. Nothing relieves the symptoms. He has tried nothing for the symptoms. The treatment provided no relief.    Past Medical History  Diagnosis Date  . Foot pain    Past Surgical History  Procedure Laterality Date  . Hernia repair     History reviewed. No pertinent family history. History  Substance Use Topics  . Smoking status: Current Some Day Smoker    Types: Cigarettes  . Smokeless tobacco: Not on file  . Alcohol Use: Yes    Review of Systems  Respiratory: Negative for shortness of breath.   Cardiovascular: Negative for chest pain.  Gastrointestinal: Negative for abdominal pain.  Neurological: Negative for headaches.  All other systems reviewed and are negative.      Allergies  Aspirin and Tetanus toxoids  Home Medications  No current outpatient prescriptions on file. BP 147/81  Pulse 96  Temp(Src) 98.3 F (36.8 C) (Oral)  Resp 20  SpO2 96% Physical Exam  Constitutional: He is oriented to person, place, and time. He appears well-developed and well-nourished. No distress.  HENT:  Head: Normocephalic and atraumatic.  Mouth/Throat: Oropharynx is clear and moist.  Eyes: Conjunctivae are normal. Pupils are equal, round, and reactive to light.  Neck: Normal range of motion. Neck supple.   Cardiovascular: Normal rate, regular rhythm and intact distal pulses.   Pulmonary/Chest: Effort normal and breath sounds normal. He has no wheezes. He has no rales.  Abdominal: Soft. Bowel sounds are normal. There is no tenderness. There is no rebound and no guarding.  Musculoskeletal: Normal range of motion.  Neurological: He is alert and oriented to person, place, and time.  Skin: Skin is warm and dry. No rash noted. No erythema. No pallor.  Psychiatric: He has a normal mood and affect.    ED Course  Procedures (including critical care time) Labs Review Labs Reviewed - No data to display Imaging Review No results found.  EKG Interpretation   None       MDM   Final diagnoses:  None    nO SIGNS OF COLD EXPOSURE    Anel Creighton K Birney Belshe-Rasch, MD 01/11/14 [phone]

## 2014-01-11 NOTE — ED Notes (Addendum)
Per night shift RN, waiting on Social Work consult before discharge.  Pt has no shoes and is homeless.

## 2014-01-11 NOTE — ED Notes (Signed)
Psych MD at bedside

## 2014-01-11 NOTE — ED Notes (Signed)
Pt questioning to go to a window.  Pt feeling confined.  Pt encouraged to stay for lab results.  Explained how cold it is outside and how we want him to get those feet taken care of.  Pt unable to get urine sample.

## 2014-01-11 NOTE — ED Provider Notes (Addendum)
9:02 AM  Pt here for foot pain in relation to cold exposure.  This is a chronic problem.  Seen by Dr. Nicanor AlconPalumbo overnight and planned to be dc'ed in AM when SW had seen and was able to get pt shoes.  SW, Olga CoasterKristen Reed, has seen pt and is concerned for bizarre behavior and thought blocking.  Asked for TTS consult.  11:30 PM  Pt attempting to leave the emergency department. Psychiatry had seen the patient and wanted to observe him until tomorrow morning. Patient becoming more agitated. He does have tangential thoughts, pressured speech, is becoming aggressive. Patient appears manic. Psychiatry physician, Dr. Gilmore LarocheAkhtar, at bedside and they agree patient needs to be involuntarily committed. Psychiatry to fill out IVC paperwork.  Will give Haldol, Ativan to keep patient calm. Will obtain screening labs and urine.  1:49 PM  Pt able to be redirected without receiving Haldol and Ativan. He is now calm, cooperative. Psychiatry will reevaluate tomorrow AM.  Psychiatry has completed paperwork for a 24 hour hold.   3:00 PM  Pt's labs, urine are unremarkable.  UDS negative.  Ethanol < 11.  Layla MawKristen N Kailin Leu, DO 01/11/14 [id number]  Layla MawKristen N Landen Breeland, DO 01/11/14 1331  Dwan Fennel N Alik Mawson, DO 01/11/14 1409  Layla MawKristen N Anddy Wingert, DO 01/11/14 1501

## 2014-01-11 NOTE — ED Notes (Signed)
Spoke with pt at length.  Pt cooperative with plan to get lab, get meal and hold for results.  Belongings wanded and searched for weapons.  Per Director, Gaylord ShihKaren, Ok for patient to keep belongings in room and do not make patient change into scrubs.

## 2014-01-11 NOTE — Progress Notes (Signed)
CSW discussed with psychiatrist, who evaluated patient and recommended observation. At that time, TTS planned to assist with papers to hold patient.    Byrd HesselbachKristen Agnes Probert, LCSW [phone]  ED CSW 01/11/2014 1531pm

## 2014-01-11 NOTE — Progress Notes (Signed)
CSW made multiple attempts to contact supports for this Patient.  CSW called the contact number for the Patient's brother [phone]) with no success.  CSW called [facility] which is a shelter the Patient reported having supported him in the past and is now awaiting a return call.      Maryelizabeth Rowanressa Corbett, MSW, [place]  Vinay Ertl, [place] [phone]  ED CSW 01/11/2014, 10:52am

## 2014-01-11 NOTE — Progress Notes (Signed)
[facility] CL provided pt with a list of primary care resources, highlighting [facility]. Also, [facility].

## 2014-01-11 NOTE — BH Assessment (Signed)
Assessment Note  Colton Lee is an 48 y.o.homeless  male presented to the ED for cold exposure but medical staff were concerned with his mental status because of bizarre behaviors. Pt presents with grandiose delusions about the government, poor insight, tangential, fair eye contact, deshelved, and appears to respond to internal stimuli. Pt is calm and cooperative with staff and did not during this assessment present as a threat.  CSW attempted to assess the patient for suicide and homicidal risk but because of present thought blocking and internal stimuli he was unable to give direct answers. When patient was questioned he was observed taking moments of looking at the ceiling, smiling inappropriately and mumbling to self before responding to the question. "I have a different kind of badge that allows me to travel to multiple countries, I been back and forth to [place], [place] and [place]."  Patient reports that some of the questions asked by this clinician is offensive and hard to answer. Patient reports that he has two sister but unable to give information on where they live or a contact number. He reports that he knows his sisters work a lot and this is why he has not spoken with them. 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  Pt reports that he is institutionalized but unable to give information on the agencies or facilities he has been admitted.  Pt confirms that he had been to some [facility] the homeless [facility] [place] [facility] CSW asked the Patient what he needed help with today and he gave permission for help with housing.  CSW informed the Patient that some shelter will be called for him.        Axis I: Psychotic Disorder NOS Axis II: Deferred Axis III:  Past Medical History  Diagnosis Date  . Foot pain    Axis IV: economic problems, housing problems, occupational problems,  other psychosocial or environmental problems, problems related to social environment and problems with primary support group Axis V: 21-30 behavior considerably influenced by delusions or hallucinations OR serious impairment in judgment, communication OR inability to function in almost all areas  Past Medical History:  Past Medical History  Diagnosis Date  . Foot pain     Past Surgical History  Procedure Laterality Date  . Hernia repair      Family History: History reviewed. No pertinent family history.  Social History:  reports that he has been smoking Cigarettes.  He has been smoking about 0.00 packs per day. He does not have any smokeless tobacco history on file. He reports that he drinks alcohol. He reports that he does not use illicit drugs.  Additional Social History:     CIWA: CIWA-Ar BP: 143/94 mmHg Pulse Rate: 75 COWS:    Allergies:  Allergies  Allergen Reactions  . Aspirin Itching  . Tetanus Toxoids Hives    Home Medications:  (Not in a hospital admission)  OB/GYN Status:  No LMP for male patient.  General Assessment Data Location of Assessment: [facility] ACT Assessment: Yes Is this a Tele or Face-to-Face Assessment?: Face-to-Face Is this an Initial Assessment or a Re-assessment for this encounter?: Initial Assessment Living Arrangements: Other (Comment) (homeless) Can pt return to current living arrangement?: Yes Admission Status: Voluntary Transfer from: Other (Comment) (homeless) Referral Source: MD  Medical Screening Exam [facility] ONLY) Medical Exam completed: Yes  [facility] Living  Arrangements: Other (Comment) (homeless) Name of Psychiatrist: none (Denies current mental health involvement) Name of Therapist: none  Education Status Is patient currently in school?: No  Risk to self Suicidal Ideation: No (Unable to assess Pt has thought blocking, no direct answers) Is patient at risk for suicide?: No (No apparent risk at this  time) Suicidal Plan?: No Access to Means: No What has been your use of drugs/alcohol within the last 12 months?: no (Denies at this time) Previous Attempts/Gestures: No Intentional Self Injurious Behavior: None Family Suicide History: Unable to assess (Pt is avasive when answering questions) Recent stressful life event(s): Loss (Comment);Financial Problems;Other (Comment) (homelessness, lack of supports) Persecutory voices/beliefs?: No Substance abuse history and/or treatment for substance abuse?: No (unknown at this time ) Suicide prevention information given to non-admitted patients: Not applicable  Risk to Others Homicidal Ideation: No (Pt present with some thought blocking, unable to assess) Thoughts of Harm to Others: No (Pt gave various reason not to harm others) Current Homicidal Intent: No Current Homicidal Plan: No Access to Homicidal Means: No History of harm to others?: No Assessment of Violence: None Noted Does patient have access to weapons?: No Criminal Charges Pending?: No Does patient have a court date: No  Psychosis Hallucinations: Auditory;Visual;With command (Pt presents w/ responding to internal stimuli) Delusions: Grandiose (He works for the [employer], associated w/president, )  Mental Status Report Appear/Hygiene: Body odor;Disheveled;Layered clothes;Poor hygiene Eye Contact: Fair Motor Activity: Restlessness Level of Consciousness: Restless Mood: Labile Affect: Inconsistent with thought content Anxiety Level: Minimal Thought Processes: Tangential Judgement: Impaired Orientation: Person;Place;Situation Obsessive Compulsive Thoughts/Behaviors: None  Cognitive Functioning Concentration: Decreased Memory:  (unable to assess) IQ: Average Insight: Poor Impulse Control: Fair Appetite: Good Vegetative Symptoms: None;Not bathing;Decreased grooming  ADLScreening [facility] Assessment Services) Patient's cognitive ability adequate to safely complete  daily activities?: Yes Patient able to express need for assistance with ADLs?: Yes Independently performs ADLs?: No  Prior Inpatient Therapy Prior Inpatient Therapy: No (Pt as a teen was seen for decreased sleeping)  Prior Outpatient Therapy Prior Outpatient Therapy: No (Pt denies)  ADL Screening (condition at time of admission) Patient's cognitive ability adequate to safely complete daily activities?: Yes Patient able to express need for assistance with ADLs?: Yes Independently performs ADLs?: No         Values / Beliefs Cultural Requests During Hospitalization: None Spiritual Requests During Hospitalization: None        Additional Information 1:1 In Past 12 Months?: No CIRT Risk: No Elopement Risk: No Does patient have medical clearance?: Yes     Disposition:  Disposition Initial Assessment Completed for this Encounter: Yes Disposition of Patient: Referred to (Referred to Psychiatrist)  On Site Evaluation by:   Reviewed with Physician:    Olga Coaster A 01/11/2014 9:47 AM

## 2014-01-11 NOTE — ED Notes (Signed)
Per Social Work, Psych MD would like us to put in Psych holding orders.

## 2014-01-11 NOTE — ED Notes (Signed)
When this RN entered this room, the Pt was laughing and smiling.  He seemed to be responding to internal stimuli.  There were cleaning wipes and trash spread all over the room.  When this RN asked what caused him to come in last night, he stated "I got stuck somewhere."   When asked about where he lives, he mumbled and said something about "natural disasters."  Pt has gotten dressed and is wearing safety/sound muffling ear phones.  This RN threw his trash away and removed the cleaning wipes from the room.  Pt asked if he could smoke a cigarette.  This RN and Annieah NT told him that he is not allowed to smoke in his room.      Pt reported to Social Work that he works for [employer] and go to any country that he wishes.

## 2014-01-11 NOTE — ED Notes (Signed)
Social Work at bedside 

## 2014-01-11 NOTE — ED Notes (Signed)
Pt reports pain to all extremities that are painful to touch, red and pt reports feels like pins and needles. Pt alert with possible learning disability.

## 2014-04-21 ENCOUNTER — Encounter (HOSPITAL_COMMUNITY): Payer: Self-pay | Admitting: Emergency Medicine

## 2014-04-21 ENCOUNTER — Emergency Department (HOSPITAL_COMMUNITY)
Admission: EM | Admit: 2014-04-21 | Discharge: 2014-04-21 | Disposition: A | Discharge status: Home / Self Care | Payer: Self-pay | Attending: Emergency Medicine | Admitting: Emergency Medicine

## 2014-04-21 ENCOUNTER — Emergency Department (HOSPITAL_COMMUNITY): Payer: Self-pay

## 2014-04-21 DIAGNOSIS — M25579 Pain in unspecified ankle and joints of unspecified foot: Secondary | ICD-10-CM | POA: Insufficient documentation

## 2014-04-21 DIAGNOSIS — Z5900 Homelessness unspecified: Secondary | ICD-10-CM | POA: Insufficient documentation

## 2014-04-21 DIAGNOSIS — M25571 Pain in right ankle and joints of right foot: Secondary | ICD-10-CM

## 2014-04-21 DIAGNOSIS — G8929 Other chronic pain: Secondary | ICD-10-CM | POA: Insufficient documentation

## 2014-04-21 DIAGNOSIS — F17200 Nicotine dependence, unspecified, uncomplicated: Secondary | ICD-10-CM | POA: Insufficient documentation

## 2014-04-21 DIAGNOSIS — R269 Unspecified abnormalities of gait and mobility: Secondary | ICD-10-CM | POA: Insufficient documentation

## 2014-04-21 LAB — CBC WITH DIFFERENTIAL/PLATELET
BASOS PCT: 0 % (ref 0–1)
Basophils Absolute: 0 10*3/uL (ref 0.0–0.1)
EOS PCT: 2 % (ref 0–5)
Eosinophils Absolute: 0.2 10*3/uL (ref 0.0–0.7)
HCT: 40.1 % (ref 39.0–52.0)
Hemoglobin: 13.2 g/dL (ref 13.0–17.0)
LYMPHS ABS: 1.4 10*3/uL (ref 0.7–4.0)
Lymphocytes Relative: 21 % (ref 12–46)
MCH: 27.3 pg (ref 26.0–34.0)
MCHC: 32.9 g/dL (ref 30.0–36.0)
MCV: 82.9 fL (ref 78.0–100.0)
Monocytes Absolute: 0.5 10*3/uL (ref 0.1–1.0)
Monocytes Relative: 8 % (ref 3–12)
NEUTROS PCT: 69 % (ref 43–77)
Neutro Abs: 4.4 10*3/uL (ref 1.7–7.7)
PLATELETS: 205 10*3/uL (ref 150–400)
RBC: 4.84 MIL/uL (ref 4.22–5.81)
RDW: 14.5 % (ref 11.5–15.5)
WBC: 6.4 10*3/uL (ref 4.0–10.5)

## 2014-04-21 LAB — BASIC METABOLIC PANEL
BUN: 14 mg/dL (ref 6–23)
CALCIUM: 9.6 mg/dL (ref 8.4–10.5)
CO2: 29 mEq/L (ref 19–32)
Chloride: 98 mEq/L (ref 96–112)
Creatinine, Ser: 1.01 mg/dL (ref 0.50–1.35)
GFR calc Af Amer: >90 mL/min (ref >90)
GFR, EST NON AFRICAN AMERICAN: 87 mL/min — AB (ref >90)
Glucose, Bld: 96 mg/dL (ref 70–99)
Potassium: 3.7 mEq/L (ref 3.7–5.3)
SODIUM: 138 meq/L (ref 137–147)

## 2014-04-21 LAB — URIC ACID: URIC ACID, SERUM: 6.6 mg/dL (ref 4.0–7.8)

## 2014-04-21 MED ORDER — HYDROCODONE-ACETAMINOPHEN 5-325 MG PO TABS
1.0000 | ORAL_TABLET | Freq: Once | ORAL | Status: AC
  Administered 2014-04-21: 1 via ORAL
  Filled 2014-04-21: qty 1

## 2014-04-21 MED ORDER — IBUPROFEN 800 MG PO TABS: 800.0000 mg | ORAL_TABLET | Freq: Three times a day (TID) | ORAL | Status: DC

## 2014-04-21 NOTE — Discharge Instructions (Signed)
Use RICE method - see below Ibuprofen for pain Return to the emergency department if you develop any changing/worsening condition, leg swelling, fever, red/hot joint, feeling sick or any other concerns (please read additional information regarding your condition below)   Ankle Pain Ankle pain is a common symptom. The bones, cartilage, tendons, and muscles of the ankle joint perform a lot of work each day. The ankle joint holds your body weight and allows you to move around. Ankle pain can occur on either side or back of 1 or both ankles. Ankle pain may be sharp and burning or dull and aching. There may be tenderness, stiffness, redness, or warmth around the ankle. The pain occurs more often when a person walks or puts pressure on the ankle. CAUSES  There are many reasons ankle pain can develop. It is important to work with your caregiver to identify the cause since many conditions can impact the bones, cartilage, muscles, and tendons. Causes for ankle pain include:  Injury, including a break (fracture), sprain, or strain often due to a fall, sports, or a high-impact activity.  Swelling (inflammation) of a tendon (tendonitis).  Achilles tendon rupture.  Ankle instability after repeated sprains and strains.  Poor foot alignment.  Pressure on a nerve (tarsal tunnel syndrome).  Arthritis in the ankle or the lining of the ankle.  Crystal formation in the ankle (gout or pseudogout). DIAGNOSIS  A diagnosis is based on your medical history, your symptoms, results of your physical exam, and results of diagnostic tests. Diagnostic tests may include X-ray exams or a computerized magnetic scan (magnetic resonance imaging, MRI). TREATMENT  Treatment will depend on the cause of your ankle pain and may include:  Keeping pressure off the ankle and limiting activities.  Using crutches or other walking support (a cane or brace).  Using rest, ice, compression, and elevation.  Participating in  physical therapy or home exercises.  Wearing shoe inserts or special shoes.  Losing weight.  Taking medications to reduce pain or swelling or receiving an injection.  Undergoing surgery. HOME CARE INSTRUCTIONS   Only take over-the-counter or prescription medicines for pain, discomfort, or fever as directed by your caregiver.  Put ice on the injured area.  Put ice in a plastic bag.  Place a towel between your skin and the bag.  Leave the ice on for 15-20 minutes at a time, 03-04 times a day.  Keep your leg raised (elevated) when possible to lessen swelling.  Avoid activities that cause ankle pain.  Follow specific exercises as directed by your caregiver.  Record how often you have ankle pain, the location of the pain, and what it feels like. This information may be helpful to you and your caregiver.  Ask your caregiver about returning to work or sports and whether you should drive.  Follow up with your caregiver for further examination, therapy, or testing as directed. SEEK MEDICAL CARE IF:   Pain or swelling continues or worsens beyond 1 week.  You have an oral temperature above 102 F (38.9 C).  You are feeling unwell or have chills.  You are having an increasingly difficult time with walking.  You have loss of sensation or other new symptoms.  You have questions or concerns. MAKE SURE YOU:   Understand these instructions.  Will watch your condition.  Will get help right away if you are not doing well or get worse. Document Released: 04/28/2010 Document Revised: 01/31/2012 Document Reviewed: 04/28/2010 [facility] Patient Information 2014 [place], [place].  RICE:  Routine Care for Injuries The routine care of many injuries includes Rest, Ice, Compression, and Elevation (RICE). HOME CARE INSTRUCTIONS  Rest is needed to allow your body to heal. Routine activities can usually be resumed when comfortable. Injured tendons and bones can take up to 6 weeks to heal.  Tendons are the cord-like structures that attach muscle to bone.  Ice following an injury helps keep the swelling down and reduces pain.  Put ice in a plastic bag.  Place a towel between your skin and the bag.  Leave the ice on for 15-20 minutes, 03-04 times a day. Do this while awake, for the first 24 to 48 hours. After that, continue as directed by your caregiver.  Compression helps keep swelling down. It also gives support and helps with discomfort. If an elastic bandage has been applied, it should be removed and reapplied every 3 to 4 hours. It should not be applied tightly, but firmly enough to keep swelling down. Watch fingers or toes for swelling, bluish discoloration, coldness, numbness, or excessive pain. If any of these problems occur, remove the bandage and reapply loosely. Contact your caregiver if these problems continue.  Elevation helps reduce swelling and decreases pain. With extremities, such as the arms, hands, legs, and feet, the injured area should be placed near or above the level of the heart, if possible. SEEK IMMEDIATE MEDICAL CARE IF:  You have persistent pain and swelling.  You develop redness, numbness, or unexpected weakness.  Your symptoms are getting worse rather than improving after several days. These symptoms may indicate that further evaluation or further X-rays are needed. Sometimes, X-rays may not show a small broken bone (fracture) until 1 week or 10 days later. Make a follow-up appointment with your caregiver. Ask when your X-ray results will be ready. Make sure you get your X-ray results. Document Released: 02/20/2001 Document Revised: 01/31/2012 Document Reviewed: 04/09/2011 [facility] Patient Information 2014 [place], [place].   Emergency Department Resource Guide 1) Find a Doctor and Pay Out of Pocket Although you won't have to find out who is covered by your insurance plan, it is a good idea to ask around and get recommendations. You will then need  to call the office and see if the doctor you have chosen will accept you as a new patient and what types of options they offer for patients who are self-pay. Some doctors offer discounts or will set up payment plans for their patients who do not have insurance, but you will need to ask so you aren't surprised when you get to your appointment.  2) Contact Your Local Health Department Not all health departments have doctors that can see patients for sick visits, but many do, so it is worth a call to see if yours does. If you don't know where your local health department is, you can check in your phone book. The CDC also has a tool to help you locate your state's health department, and many state websites also have listings of all of their local health departments.  3) Find a Walk-in Clinic If your illness is not likely to be very severe or complicated, you may want to try a walk in clinic. These are popping up all over the country in pharmacies, drugstores, and shopping centers. They're usually staffed by nurse practitioners or physician assistants that have been trained to treat common illnesses and complaints. They're usually fairly quick and inexpensive. However, if you have serious medical issues or chronic medical problems, these are probably  not your best option.  No Primary Care Doctor: - Call Health Connect at  [phone] - they can help you locate a primary care doctor that  accepts your insurance, provides certain services, etc. - Physician Referral Service- [phone]  Chronic Pain Problems: Organization         Address  Phone   Notes  [facility]  [phone] Patients need to be referred by their primary care doctor.   Medication Assistance: Organization         Address  Phone   Notes  [facility] Medication [facility] [address] [place] [place], [place] [address] [phone] --Must be a resident of [facility] -- Must have NO insurance  coverage whatsoever (no Medicaid/ Medicare, etc.) -- The pt. MUST have a primary care doctor that directs their care regularly and follows them in the community   MedAssist  [phone]   [employer]  [phone]    Agencies that provide inexpensive medical care: Organization         Address  Phone   Notes  [facility]  [phone]   [facility]    [phone]   [facility] [address] [place], [place] [address] [phone]   [facility] [address] [place], [place] [phone]   Planned Parenthood    [phone]   [facility]    [phone]   [facility]  [address], [place] Phone:  [phone], Fax:  [phone] Hours of Operation:  9 am - 6 pm, M-F.  Also accepts Medicaid/Medicare and self-pay.  [facility]  [address], [place] Phone: [phone], Fax: [phone]. Hours of Operation:  8:30 am - 5:30 pm, M-F.  Also accepts Medicaid and self-pay.  [facility] [place] [address] [place] Phone: [phone]   [facility] [address] Natasha Bence [place], [place] [phone], Ext. [phone] Mondays & Thursdays: 7-9 AM.  First 15 patients are seen on a first come, first serve basis.    Medicaid-accepting [facility] Providers:  Organization         Address  Phone   Notes  [facility] [address], [place] [phone] Also accepts self-pay patients.  [facility] [address] Laurell Josephs [place], [place]  [phone]   [facility] [address], [place] [phone]   [facility] [address], [place] [phone]   Renaye Rakers [address], [place]   [phone] Only accepts [place] Access [place] patients after they have their  name applied to their card.   Self-Pay (no insurance) in [facility]:  Organization         Address  Phone   Notes  Sickle Cell Patients, [facility] [address] [place], [place] [phone]   [facility] [address] [place], [place] [phone]   [facility] [place]  [address], [place] [phone]   [facility]/Dr. Osei-Bonsu  [address], [place] or [address], [place] [phone] Phone number for both [place] and [place] locations is the same.  [facility] [address], [place] [phone]) [phone]   [facility] [place] [address] [place] [address], [place] or [address] [phone]) [phone]   [facility] [address], [place] [phone], phone; [phone](336) [phone], fax Sees patients 1st and 3rd Saturday of every month.  Must not qualify for public or private insurance (i.e. Medicaid, Medicare, [place] Health Choice, Veterans' Benefits)  Household income should be no more than 200% of the poverty level The clinic cannot treat you if you are pregnant or think you are pregnant  Sexually transmitted diseases are not treated at the clinic.    Dental Care: Organization         Address  Phone  Notes  [facility] [address], [place] [phone]) [phone] Accepts children up to age 48 who are enrolled in [place] or [place] Health Choice; pregnant women with a Medicaid card; and children who have applied for Medicaid or [place] Health Choice, but were declined, whose parents can pay a reduced fee at time of service.  [facility] [place]  [address] [place] [address], [place] [phone] Accepts children up to age 48 who are enrolled in [place] or [place] Health Choice; pregnant women with a Medicaid card; and children who have applied for  Medicaid or [place] Health Choice, but were declined, whose parents can pay a reduced fee at time of service.  [facility]  [address], [place] [phone]) [phone] Patients are seen by appointment only. Walk-ins are not accepted. [facility] will see patients 48 years of age and older. Monday - Tuesday (8am-5pm) Most Wednesdays (8:30-5pm) $30 per visit, cash only  [facility]  [address], [facility] [phone] Patients are seen by appointment only. Walk-ins are not accepted. [facility] will see patients 48 years of age and older. One Wednesday Evening (Monthly: Volunteer Based).  $30 per visit, cash only  [employer] [facility]  [phone]) [phone] for adults; Children under age 94, call Graduate Pediatric Dentistry at [phone]) [phone]. Children aged 294-14, please call [phone]) [phone] to request a pediatric application.  Dental services are provided in all areas of dental care including fillings, crowns and bridges, complete and partial dentures, implants, gum treatment, root canals, and extractions. Preventive care is also provided. Treatment is provided to both adults and children. Patients are selected via a lottery and there is often a waiting list.   [facility] [address], [place]  [phone]) [phone] [email or web address]   [facility] [address], [place], [place] [phone], Ext. [phone] Second and Fourth Thursday of each month, opens at 6:30 AM; Clinic ends at 9 AM.  Patients are seen on a first-come first-served basis, and a limited number are seen during each clinic.   [facility]  [address], [place], [place] [phone]) [phone]   Eligibility Requirements You must have lived in [place], [place], or [place] counties for at least the last three months.   You cannot be eligible for state or federal sponsored National [place]healthcare insurance, including CIGNAVeterans Administration, [place]icaid,  or [employer]Medicare.   You generally cannot be eligible for healthcare insurance through your employer.    How to apply: Eligibility screenings are held every Tuesday and Wednesday afternoon from 1:00 pm until 4:00 pm. You do not need an appointment for the interview!  [facility] [phone]  [place], [place], [place] [phone]   [facility] Health Department  [phone]   [facility] Health Department  [phone]   [facility] Health Department  [phone]    Behavioral Health Resources in the Community: Intensive Outpatient Programs Organization         Address  Phone  Notes  [facility] [address], [place], [place] [phone]   [facility] Outpatient [address] [place], [place] [phone]   [facility] [address], [place], [place]  [phone]   [facility] Mental Health [address],  [place], [place] [phone] or [phone]   Substance Abuse Resources Organization         Address  Phone  Notes  [facility] Drug Services  [phone]   [facility]  [phone]   [facility] [place]  [phone]   Floydene Flock  [phone]   Residential & Outpatient Substance Abuse Program  [phone]   Psychological Services Organization         Address  Phone  Notes  [facility]  [phone]   [facility] Services  [phone]   [facility] [address] [place] [phone] or [phone]    Mobile Crisis Teams Organization         Address  Phone  Notes  [facility], Mobile Crisis Care Unit  [phone]   [facility]  [address] [place], [place] [phone]   Doristine Locks [address] [place] [phone]    Self-Help/Support Groups Organization         Address  Phone             Notes  [facility] [place] - variety of support groups   336- I7437963 Call for more information  Narcotics Anonymous (NA), [facility] [address], [employer] [place]  2 meetings at this location   Statistician         Address  Phone  Notes  [facility] [address],    [place]  [phone]   [facility]  [address], [place] [address], [place], [place] [phone]   [facility] [address] [place], [place] [phone] Admissions: 8am-3pm M-F  [facility] [address]    [place], [place] [phone]   [facility] [address] [place], [place], [place] [phone]   [facility] [address]  [place], [place] [phone]   [facility] - Intensive Outpatient [address] Laurell Joseph[address], [place], [place] [phone]   [facility] ([facility] [address] [place]  [place], [place] [phone] or [phone]   [facility] ([facility]) [address] [place], [place] [phone] Accepts Medicaid  [facility] [place] [address],  [place] [phone] Substance Abuse/Addiction Treatment   [facility] Organization         Address  Phone  Notes  [facility]  [phone]   Angie Fava, PhD [address] [place] Knack [place], [place]   [phone] or [phone]   [facility] Behavioral   [address] [place], [place] [phone]   [facility] [address], [place], [place] [phone] Insurance/Medicaid/sponsorship through [employer] and Families [address]  [place], [place] [phone] Therapy/tele-psych/case  [facility] [address]   [place], [place] [phone]    Dr. Lolly Mustache  [phone]   [facility]  [employer] [facility] Dept. 1) [address] [place] 2) [address], [place] 3)  [address], [place] [phone]  [phone]   [facility] Child Abuse Hotline 857-207-8356 or [phone] (After Hours)

## 2014-04-21 NOTE — ED Notes (Signed)
Per pt report: Pt c/o right ankle swelling and pain since 2010.  Pt thinks he was bit by something.  Pt reports pain has been progressively increasing for the past week.  Pt thinks he's also having nerve issues.  Pt a/o x 4.  Skin warm and dry. Pt ambulatory.

## 2014-04-21 NOTE — ED Provider Notes (Signed)
CSN: [id number]     Arrival date & time 04/21/14  0515 History   None    Chief Complaint  Patient presents with  . Joint Swelling  . Ankle Pain   HPI  Colton Lee is a 48 y.o. male with a PMH of chronic foot pain who presents to the ED for evaluation of joint swelling and ankle pain. History was provided by the patient. Patient is a poor historian. Patient has has had bilateral foot pain for years (at least since 2010 - multiple ED visits for this). Patient states that his right ankle and foot has been getting worse over the past few days (or months?). Patient states he has bilateral chronic foot pain, but is unsure if it has involved his ankle in the past. Pain is currently worse on the right compared to the left. Patient denies any injuries or trauma, but does "a lot of training with weights" and walking (patient admits to being homeless). He believes he may have been bit by something. He denies any open wounds or drainage. Pain is an intermittent aching sore pain. Pain worse with movement and walking. Denies any hx of gout. Patient states he has had a subjective fever (days ago?) but denies taking his temperature. Patient denies any fatigue, generalized weakness, nausea, or vomiting. Patient did not take anything to treat his symptoms. No weakness, loss of sensation, numbness/tingling. Patient states he walked to the ED. He wears flip-flops and lost his shoes.    Past Medical History  Diagnosis Date  . Foot pain    Past Surgical History  Procedure Laterality Date  . Hernia repair     No family history on file. History  Substance Use Topics  . Smoking status: Current Some Day Smoker    Types: Cigars  . Smokeless tobacco: Not on file  . Alcohol Use: Yes     Comment: rarely    Review of Systems  Constitutional: Positive for fever (subjective). Negative for chills, activity change, appetite change and fatigue.  Respiratory: Negative for cough and shortness of breath.    Cardiovascular: Negative for leg swelling.  Gastrointestinal: Negative for nausea, vomiting and abdominal pain.  Musculoskeletal: Positive for gait problem (due to pain) and joint swelling. Negative for back pain and myalgias.  Skin: Negative for wound.  Neurological: Negative for weakness and numbness.     Allergies  Aspirin and Tetanus toxoids  Home Medications   Prior to Admission medications   Not on File   BP 147/95  Pulse 68  Temp(Src) 97.9 F (36.6 C)  Resp 16  SpO2 100%  Filed Vitals:   04/21/14 0531 04/21/14 0837 04/21/14 1044  BP: 147/95 104/75 100/70  Pulse: 68 70 81  Temp: 97.9 F (36.6 C)    Resp: 16 16 18   SpO2: 100% 100% 98%    Physical Exam  Nursing note and vitals reviewed. Constitutional: He is oriented to person, place, and time. He appears well-developed and well-nourished. No distress.  Non-toxic  HENT:  Head: Normocephalic and atraumatic.  Right Ear: External ear normal.  Left Ear: External ear normal.  Mouth/Throat: Oropharynx is clear and moist.  Eyes: Conjunctivae are normal. Right eye exhibits no discharge. Left eye exhibits no discharge.  Neck: Normal range of motion. Neck supple.  Cardiovascular: Normal rate, regular rhythm, normal heart sounds and intact distal pulses.  Exam reveals no gallop and no friction rub.   No murmur heard. Dorsalis pedis pulses present and equal bilaterally  Pulmonary/Chest: Effort  normal and breath sounds normal. No respiratory distress. He has no wheezes. He has no rales. He exhibits no tenderness.  Abdominal: Soft. He exhibits no distension. There is no tenderness.  Musculoskeletal: Normal range of motion. He exhibits edema and tenderness.       Feet:  Diffuse tenderness to palpation to the right anterior, medial and lateral ankle with associated non-pitting edema from the dorsum of the right foot to the ankle throughout. No erythema or warmth to the touch. Patient able to flex and extend digits of right  foot. Right ankle ROM is limited due to pain. No knee or calf tenderness. Trace pitting anterior tibial edema equal bilaterally.   Neurological: He is alert and oriented to person, place, and time.  Gross sensation intact in the LE bilaterally  Skin: Skin is warm and dry. He is not diaphoretic.  No open wounds or lacerations. Rough dry scaling skin and callous to the feet bilaterally.      ED Course  Procedures (including critical care time) Labs Review Labs Reviewed - No data to display  Imaging Review Dg Ankle Complete Right  04/21/2014   CLINICAL DATA:  Pain and swelling  EXAM: RIGHT ANKLE - COMPLETE 3+ VIEW  COMPARISON:  None.  FINDINGS: Generalized soft tissue swelling is noted. No acute fracture or dislocation is seen.  IMPRESSION: Soft tissue swelling without acute bony abnormality.   Electronically Signed   By: Alcide Clever M.D.   On: 04/21/2014 07:35     EKG Interpretation None      Results for orders placed during the hospital encounter of 04/21/14  CBC WITH DIFFERENTIAL      Result Value Ref Range   WBC 6.4  4.0 - 10.5 K/uL   RBC 4.84  4.22 - 5.81 MIL/uL   Hemoglobin 13.2  13.0 - 17.0 g/dL   HCT 16.1  09.6 - 04.5 %   MCV 82.9  78.0 - 100.0 fL   MCH 27.3  26.0 - 34.0 pg   MCHC 32.9  30.0 - 36.0 g/dL   RDW 40.9  81.1 - 91.4 %   Platelets 205  150 - 400 K/uL   Neutrophils Relative % 69  43 - 77 %   Neutro Abs 4.4  1.7 - 7.7 K/uL   Lymphocytes Relative 21  12 - 46 %   Lymphs Abs 1.4  0.7 - 4.0 K/uL   Monocytes Relative 8  3 - 12 %   Monocytes Absolute 0.5  0.1 - 1.0 K/uL   Eosinophils Relative 2  0 - 5 %   Eosinophils Absolute 0.2  0.0 - 0.7 K/uL   Basophils Relative 0  0 - 1 %   Basophils Absolute 0.0  0.0 - 0.1 K/uL  BASIC METABOLIC PANEL      Result Value Ref Range   Sodium 138  137 - 147 mEq/L   Potassium 3.7  3.7 - 5.3 mEq/L   Chloride 98  96 - 112 mEq/L   CO2 29  19 - 32 mEq/L   Glucose, Bld 96  70 - 99 mg/dL   BUN 14  6 - 23 mg/dL   Creatinine, Ser  7.82  0.50 - 1.35 mg/dL   Calcium 9.6  8.4 - 95.6 mg/dL   GFR calc non Af Amer 87 (*) >90 mL/min   GFR calc Af Amer >90  >90 mL/min  URIC ACID      Result Value Ref Range   Uric Acid, Serum 6.6  4.0 -  7.8 mg/dL     MDM   Colton Lee is a 48 y.o. male with a PMH of chronic foot pain who presents to the ED for evaluation of joint swelling and ankle pain. Etiology of ankle edema likely due to inflammation from overuse/arthritis vs strain/sprain vs gout. Doubt septic joint infection. Patient afebrile and non-toxic in appearance. Patient neurovascularly intact. Patient given ACE wrap and post-op shoe. Labs unremarkable with no leukocytosis. Uric acid WNL. X-rays shows diffuse soft tissue swelling with no acute abnormalities. RICE method discussed. Follow-up resources provided. Return precautions, discharge instructions, and follow-up was discussed with the patient before discharge.      Rechecks  10:45 AM = Patient ambulating around the room without difficulty. States feels much better. Eating a sandwich. No distress.     Discharge Medication List as of 04/21/2014 10:43 AM    START taking these medications   Details  ibuprofen (ADVIL,MOTRIN) 800 MG tablet Take 1 tablet (800 mg total) by mouth 3 (three) times daily., Starting 04/21/2014, Until Discontinued, Print        Final impressions: 1. Ankle pain, right      Luiz IronJessica Katlin Demonica Farrey PA-C   This patient was discussed with Dr. Mingo AmberPlunkett          Azelea Seguin K Trianna Lupien, PA-C 04/23/14 [phone]

## 2014-04-21 NOTE — ED Notes (Signed)
Pt given sandwich and coffee per request and approved by PA

## 2014-04-24 NOTE — ED Provider Notes (Signed)
Medical screening examination/treatment/procedure(s) were conducted as a shared visit with non-physician practitioner(s) and myself.  I personally evaluated the patient during the encounter.   EKG Interpretation None      Pt with hx of chronic ankle pain who is homeless and walks numerous miles in flip flops daily here c/o of right ankle pain.  Non-pitting edema noted with minimal warmth without erythema or restriction with ROM.  No signs to suggest septic joint.  States he has had this problem since 2010. Feel most likely due to overuse and poor foot support.  Gwyneth Sprout, MD 04/24/14 1334

## 2014-05-08 ENCOUNTER — Emergency Department (HOSPITAL_COMMUNITY): Payer: Self-pay

## 2014-05-08 ENCOUNTER — Encounter (HOSPITAL_COMMUNITY): Payer: Self-pay | Admitting: Emergency Medicine

## 2014-05-08 ENCOUNTER — Emergency Department (HOSPITAL_COMMUNITY)
Admission: EM | Admit: 2014-05-08 | Discharge: 2014-05-08 | Disposition: A | Discharge status: Home / Self Care | Payer: Self-pay | Attending: Emergency Medicine | Admitting: Emergency Medicine

## 2014-05-08 DIAGNOSIS — G8929 Other chronic pain: Secondary | ICD-10-CM | POA: Insufficient documentation

## 2014-05-08 DIAGNOSIS — F17200 Nicotine dependence, unspecified, uncomplicated: Secondary | ICD-10-CM | POA: Insufficient documentation

## 2014-05-08 DIAGNOSIS — M79609 Pain in unspecified limb: Secondary | ICD-10-CM | POA: Insufficient documentation

## 2014-05-08 DIAGNOSIS — M7989 Other specified soft tissue disorders: Secondary | ICD-10-CM

## 2014-05-08 DIAGNOSIS — M79673 Pain in unspecified foot: Secondary | ICD-10-CM

## 2014-05-08 LAB — CBC WITH DIFFERENTIAL/PLATELET
Basophils Absolute: 0 10*3/uL (ref 0.0–0.1)
Basophils Relative: 0 % (ref 0–1)
Eosinophils Absolute: 0.2 10*3/uL (ref 0.0–0.7)
Eosinophils Relative: 3 % (ref 0–5)
HCT: 39.5 % (ref 39.0–52.0)
Hemoglobin: 12.4 g/dL — ABNORMAL LOW (ref 13.0–17.0)
LYMPHS ABS: 1.4 10*3/uL (ref 0.7–4.0)
LYMPHS PCT: 24 % (ref 12–46)
MCH: 26.4 pg (ref 26.0–34.0)
MCHC: 31.4 g/dL (ref 30.0–36.0)
MCV: 84.2 fL (ref 78.0–100.0)
Monocytes Absolute: 0.4 10*3/uL (ref 0.1–1.0)
Monocytes Relative: 7 % (ref 3–12)
NEUTROS PCT: 66 % (ref 43–77)
Neutro Abs: 3.7 10*3/uL (ref 1.7–7.7)
Platelets: 237 10*3/uL (ref 150–400)
RBC: 4.69 MIL/uL (ref 4.22–5.81)
RDW: 14.7 % (ref 11.5–15.5)
WBC: 5.6 10*3/uL (ref 4.0–10.5)

## 2014-05-08 LAB — COMPREHENSIVE METABOLIC PANEL
ALK PHOS: 74 U/L (ref 39–117)
ALT: 11 U/L (ref 0–53)
AST: 15 U/L (ref 0–37)
Albumin: 3.7 g/dL (ref 3.5–5.2)
BUN: 14 mg/dL (ref 6–23)
CO2: 26 meq/L (ref 19–32)
Calcium: 9.1 mg/dL (ref 8.4–10.5)
Chloride: 104 mEq/L (ref 96–112)
Creatinine, Ser: 1.13 mg/dL (ref 0.50–1.35)
GFR calc Af Amer: 88 mL/min — ABNORMAL LOW (ref >90)
GFR, EST NON AFRICAN AMERICAN: 76 mL/min — AB (ref >90)
GLUCOSE: 97 mg/dL (ref 70–99)
Potassium: 4 mEq/L (ref 3.7–5.3)
SODIUM: 143 meq/L (ref 137–147)
TOTAL PROTEIN: 7.1 g/dL (ref 6.0–8.3)
Total Bilirubin: 0.2 mg/dL — ABNORMAL LOW (ref 0.3–1.2)

## 2014-05-08 LAB — PRO B NATRIURETIC PEPTIDE: Pro B Natriuretic peptide (BNP): 8.5 pg/mL (ref 0–125)

## 2014-05-08 LAB — URIC ACID: Uric Acid, Serum: 5.6 mg/dL (ref 4.0–7.8)

## 2014-05-08 MED ORDER — HYDROCODONE-ACETAMINOPHEN 5-325 MG PO TABS
1.0000 | ORAL_TABLET | Freq: Once | ORAL | Status: AC
  Administered 2014-05-08: 1 via ORAL
  Filled 2014-05-08: qty 1

## 2014-05-08 MED ORDER — HYDROCODONE-ACETAMINOPHEN 5-325 MG PO TABS: 1.0000 | ORAL_TABLET | Freq: Four times a day (QID) | ORAL | Status: DC | PRN

## 2014-05-08 NOTE — Progress Notes (Signed)
VASCULAR LAB PRELIMINARY  PRELIMINARY  PRELIMINARY  PRELIMINARY  Bilateral lower extremity venous duplex completed.    Preliminary report:  Bilateral:  No evidence of DVT, superficial thrombosis, or Baker's Cyst.   CESTONE, HELENE, RVT 05/08/2014, 1:09 PM

## 2014-05-08 NOTE — ED Notes (Signed)
Registration stopped by to change name - will call [phone] when PT returns to room

## 2014-05-08 NOTE — ED Notes (Signed)
Didn't want wheelchair

## 2014-05-08 NOTE — ED Notes (Signed)
[facility]  Primary care resource guide and my contact information provided. Patient in and out of sleep while I was in the room. Pt requested nurse when asked if there was anything he needed. No other needs expressed at this time.

## 2014-05-08 NOTE — ED Notes (Addendum)
PT hand sign e-sign form. Placed in chart. No computer w/ pad available

## 2014-05-08 NOTE — ED Notes (Signed)
Attempted IV x2. Will have another nurse look for IV when PT returns from venous duplex

## 2014-05-08 NOTE — ED Provider Notes (Signed)
CSN: [id number]     Arrival date & time 05/08/14  0859 History   First MD Initiated Contact with Patient 05/08/14 0900     Chief Complaint  Patient presents with  . Leg Swelling     (Consider location/radiation/quality/duration/timing/severity/associated sxs/prior Treatment) The history is provided by the patient. No language interpreter was used.  Colton Lee is a 48 year old male with no known significant past medical history presenting to the ED with bilateral foot and ankle swelling as well as pain does been ongoing intermittently since 2010. Patient reports that the pain is localized to the feet bilaterally circumferentially described as a aching, throbbing, heaviness sensation with discomfort in his lower portion of his legs. Stated he noticed swelling not only to the feet, but to the legs bilaterally a couple of days ago. Stated that he does not have a car and walks everywhere he goes. Reported that standing on the feet makes the pain worse while resting and elevating the legs makes the pain better. Stated he has not been using anything for discomfort. Denies history of diabetes, hypertension. Denied loss of sensation, falls, injury, chest pain, shortness of breath, difficulty breathing. PCP none  Past Medical History  Diagnosis Date  . Foot pain    Past Surgical History  Procedure Laterality Date  . Hernia repair     No family history on file. History  Substance Use Topics  . Smoking status: Current Some Day Smoker    Types: Cigars  . Smokeless tobacco: Not on file  . Alcohol Use: Yes     Comment: rarely    Review of Systems  Constitutional: Negative for fever and chills.  Respiratory: Negative for chest tightness and shortness of breath.   Cardiovascular: Positive for leg swelling. Negative for chest pain.  Musculoskeletal: Positive for arthralgias (Bilateral feet).  Neurological: Negative for weakness and numbness.      Allergies  Aspirin and Tetanus  toxoids  Home Medications   Prior to Admission medications   Medication Sig Start Date End Date Taking? Authorizing Provider  HYDROcodone-acetaminophen (NORCO/VICODIN) 5-325 MG per tablet Take 1 tablet by mouth every 6 (six) hours as needed for moderate pain or severe pain. 05/08/14   Marissa Sciacca, PA-C   BP 136/75  Pulse 64  Temp(Src) 98.1 F (36.7 C) (Oral)  Resp 18  SpO2 100% Physical Exam  Nursing note and vitals reviewed. Constitutional: He is oriented to person, place, and time. He appears well-developed and well-nourished. No distress.  HENT:  Head: Normocephalic and atraumatic.  Mouth/Throat: Oropharynx is clear and moist. No oropharyngeal exudate.  Eyes: Conjunctivae and EOM are normal. Right eye exhibits no discharge. Left eye exhibits no discharge.  Neck: Normal range of motion. Neck supple.  Cardiovascular: Normal rate, regular rhythm and normal heart sounds.  Exam reveals no friction rub.   No murmur heard. Pulses:      Radial pulses are 2+ on the right side, and 2+ on the left side.       Dorsalis pedis pulses are 2+ on the right side, and 2+ on the left side.  Cap refill less than 3 seconds Swelling identified to lower extremities bilaterally with negative pitting edema noted Negative malleolus ulcers identified  Pulmonary/Chest: Effort normal and breath sounds normal. No respiratory distress. He has no wheezes. He has no rales.  Patient is able to speak in full sentences without difficulty Negative use of accessory muscles Negative stridor  Musculoskeletal: He exhibits tenderness.  Feet:  Negative swelling, erythema, formation, warmth upon palpation noted to the feet bilaterally. Calcified and dry feet bilaterally. Full range of motion to the digits of the feet bilaterally with discomfort noted. Full range of motion to the ankles bilaterally.  Neurological: He is alert and oriented to person, place, and time. No cranial nerve deficit. He exhibits normal  muscle tone. Coordination normal.  Cranial nerves III-XII grossly intact Strength 5+/5+ to lower extremities bilaterally with resistance applied, equal distribution noted Strength intact to digits of the feet bilaterally Sensation intact with differentiation to sharp and dull touch  Skin: Skin is warm and dry. No rash noted. He is not diaphoretic. No erythema.  Dry skin identified to the ankle and feet bilaterally with scaling. Hard calcified skin identified to the calcaneal region of the feet bilaterally. Fungal infection identified to the toenails bilaterally.  Psychiatric: He has a normal mood and affect. His behavior is normal. Thought content normal.    ED Course  Procedures (including critical care time)  Results for orders placed during the hospital encounter of 05/08/14  CBC WITH DIFFERENTIAL      Result Value Ref Range   WBC 5.6  4.0 - 10.5 K/uL   RBC 4.69  4.22 - 5.81 MIL/uL   Hemoglobin 12.4 (*) 13.0 - 17.0 g/dL   HCT 16.1  09.6 - 04.5 %   MCV 84.2  78.0 - 100.0 fL   MCH 26.4  26.0 - 34.0 pg   MCHC 31.4  30.0 - 36.0 g/dL   RDW 40.9  81.1 - 91.4 %   Platelets 237  150 - 400 K/uL   Neutrophils Relative % 66  43 - 77 %   Neutro Abs 3.7  1.7 - 7.7 K/uL   Lymphocytes Relative 24  12 - 46 %   Lymphs Abs 1.4  0.7 - 4.0 K/uL   Monocytes Relative 7  3 - 12 %   Monocytes Absolute 0.4  0.1 - 1.0 K/uL   Eosinophils Relative 3  0 - 5 %   Eosinophils Absolute 0.2  0.0 - 0.7 K/uL   Basophils Relative 0  0 - 1 %   Basophils Absolute 0.0  0.0 - 0.1 K/uL  COMPREHENSIVE METABOLIC PANEL      Result Value Ref Range   Sodium 143  137 - 147 mEq/L   Potassium 4.0  3.7 - 5.3 mEq/L   Chloride 104  96 - 112 mEq/L   CO2 26  19 - 32 mEq/L   Glucose, Bld 97  70 - 99 mg/dL   BUN 14  6 - 23 mg/dL   Creatinine, Ser 7.82  0.50 - 1.35 mg/dL   Calcium 9.1  8.4 - 95.6 mg/dL   Total Protein 7.1  6.0 - 8.3 g/dL   Albumin 3.7  3.5 - 5.2 g/dL   AST 15  0 - 37 U/L   ALT 11  0 - 53 U/L   Alkaline  Phosphatase 74  39 - 117 U/L   Total Bilirubin 0.2 (*) 0.3 - 1.2 mg/dL   GFR calc non Af Amer 76 (*) >90 mL/min   GFR calc Af Amer 88 (*) >90 mL/min  URIC ACID      Result Value Ref Range   Uric Acid, Serum 5.6  4.0 - 7.8 mg/dL  PRO B NATRIURETIC PEPTIDE      Result Value Ref Range   Pro B Natriuretic peptide (BNP) 8.5  0 - 125 pg/mL    Labs  Review Labs Reviewed  CBC WITH DIFFERENTIAL - Abnormal; Notable for the following:    Hemoglobin 12.4 (*)    All other components within normal limits  COMPREHENSIVE METABOLIC PANEL - Abnormal; Notable for the following:    Total Bilirubin 0.2 (*)    GFR calc non Af Amer 76 (*)    GFR calc Af Amer 88 (*)    All other components within normal limits  URIC ACID  PRO B NATRIURETIC PEPTIDE    Imaging Review Dg Chest 2 View  05/08/2014   CLINICAL DATA:  Lower extremity pain and swelling  EXAM: CHEST  2 VIEW  COMPARISON:  PA and lateral chest of October 18, 2013  FINDINGS: The lungs are adequately inflated. There is no focal infiltrate. The heart is normal in size. The pulmonary vascularity is not engorged. There is no pleural effusion. There is gentle levoscoliosis of the upper thoracic spine.  IMPRESSION: There is no active cardiopulmonary disease.   Electronically Signed   By: David  Swazil[place]   On: 05/08/2014 12:17     EKG Interpretation None      MDM   Final diagnoses:  Chronic foot pain    Medications  HYDROcodone-acetaminophen (NORCO/VICODIN) 5-325 MG per tablet 1 tablet (1 tablet Oral Given 05/08/14 1517)    Filed Vitals:   05/08/14 1319 05/08/14 1425 05/08/14 1515 05/08/14 1524  BP: 139/88 120/60 134/78 136/75  Pulse: 63 75 71 64  Temp:      TempSrc:      Resp:      SpO2: 100% 98% 100% 100%   This provider reviewed patient's chart. Patient is been seen and assessed in ED setting numerous times regarding ongoing bilateral foot pain. Patient has been referred to numerous outpatient resources, patient continues to not follow  up. Last visit was 04/21/2014. CBC negative elevated white blood cell count-negative left shift or leukocytosis noted. CMP negative findings-kidney and liver functioning well-negative elevation of liver enzymes, alkaline phosphatase, bilirubin. Electrolytes probably balance. Uric acid negative elevation. BMP negative findings-negative elevation. Bilateral lower extremity Dopplers negative for DVT, venous thrombosis. Doubt CHF. Doubt cardiac issue. Doubt gout. Doubt septic joint. This appears to be a chronic issue since 2010. Patient's feet cleaned, lotion applied, wrapped in Ace bandage for compression. Patient reported that the only shoes he owns is a sandal. Discussed with patient importance of proper shoes and insoles. Patient given pain medications while in ED setting for control of discomfort. Patient seen by Child psychotherapistsocial worker. Patient stable, afebrile. Negative focal neurological deficits noted. Patient neurovascularly intact. Patient not septic appearing. Discharged patient. Definitive etiology of chronic foot pain unknown. Discharged patient with a small dose of pain medications-discussed course, precautions, disposal technique. Discussed with patient to rest, ice, elevate. [facility] and [facility]opedics and podiatrist. Discussed with patient to closely monitor symptoms and if symptoms are to worsen or change to report back to the ED - strict return instructions given.  Patient agreed to plan of care, understood, all questions answered.   Raymon MuttonMarissa Sciacca, PA-C 05/08/14 1806

## 2014-05-08 NOTE — ED Notes (Signed)
PT asked to speak to social work. Social work called and will come to see PT

## 2014-05-08 NOTE — Discharge Instructions (Signed)
Please call and set up an appointment with health and wellness Center, orthopedics, and foot physician Please rest and stay hydrated Please rest, ice, elevate feet-toes above her nose to aid in swelling to go down Please keep feet round and bandage to aid in decreasing swelling and for comfort/cushion Please take pain medications as prescribed-while on pain medications there is to be no drinking alcohol, driving, operating any heavy machinery Please acquire proper shoes such as his sneakers with a soft heel and good arch to aid in relief Please continue to monitor symptoms closely and if symptoms are to worsen or change (fever greater than 101, chills, chest pain, shortness of breath, difficulty breathing, worsening swelling to the legs, redness, ridge treat, hot to the touch, blisters, bleeding, heaviness, weakness, fall, injury) please report back to the ED immediately   Emergency Department Resource Guide 1) Find a Doctor and Pay Out of Pocket Although you won't have to find out who is covered by your insurance plan, it is a good idea to ask around and get recommendations. You will then need to call the office and see if the doctor you have chosen will accept you as a new patient and what types of options they offer for patients who are self-pay. Some doctors offer discounts or will set up payment plans for their patients who do not have insurance, but you will need to ask so you aren't surprised when you get to your appointment.  2) Contact Your Local Health Department Not all health departments have doctors that can see patients for sick visits, but many do, so it is worth a call to see if yours does. If you don't know where your local health department is, you can check in your phone book. The CDC also has a tool to help you locate your state's health department, and many state websites also have listings of all of their local health departments.  3) Find a Walk-in Clinic If your illness is  not likely to be very severe or complicated, you may want to try a walk in clinic. These are popping up all over the country in pharmacies, drugstores, and shopping centers. They're usually staffed by nurse practitioners or physician assistants that have been trained to treat common illnesses and complaints. They're usually fairly quick and inexpensive. However, if you have serious medical issues or chronic medical problems, these are probably not your best option.  No Primary Care Doctor: - Call Health Connect at  [phone] - they can help you locate a primary care doctor that  accepts your insurance, provides certain services, etc. - Physician Referral Service- [phone]  Chronic Pain Problems: Organization         Address  Phone   Notes  [facility]  [phone] Patients need to be referred by their primary care doctor.   Medication Assistance: Organization         Address  Phone   Notes  [facility] Medication [facility] [address] [place] [place], [place] [address] [phone] --Must be a resident of [facility] -- Must have NO insurance coverage whatsoever (no Medicaid/ Medicare, etc.) -- The pt. MUST have a primary care doctor that directs their care regularly and follows them in the community   Med[facility]ist  [phone]   [employer]  [phone]    Agencies that provide inexpensive medical care: Organization         Address  Phone   Notes  [facility]  [phone]   [facility]    [phone]   [facility] [address] [place], [place] [address] [phone]   [facility] [address] [place], [place] [phone]   Planned Parenthood    [phone]   [facility]    [phone]   Community Health and [facility]  [address], [place] Phone:  [phone], Fax:  [phone] Hours of Operation:  9 am - 6  pm, M-F.  Also accepts Medicaid/Medicare and self-pay.  [facility]  [address], [place] Phone: [phone], Fax: [phone]. Hours of Operation:  8:30 am - 5:30 pm, M-F.  Also accepts Medicaid and self-pay.  [facility] [place] [address], [place] Phone: [phone]   [facility] [address] Natasha Bence [place], [place] [phone], Ext. [phone] Mondays & Thursdays: 7-9 AM.  First 15 patients are seen on a first come, first serve basis.    Medicaid-accepting [facility] Providers:  Organization         Address  Phone   Notes  [facility] [address], [place] [phone] Also accepts self-pay patients.  [facility] [address] Laurell Josephs [place], [place]  [phone]   [facility] [address], [place] [phone]   [facility] [address] [place] [phone]   Renaye Rakers [address], [place]   [phone] Only accepts [place] patients after they have their name applied to their card.   Self-Pay (no insurance) in [facility]:  Organization         Address  Phone   Notes  Sickle Cell Patients, [facility] [address] [place], [place] [phone]   [facility] [address] [place], [place] [phone]   [facility] [place]  [address], [place] [phone]   [facility]/Dr. Osei-Bonsu  [address], [place] or [address], [place] [phone] Phone number for both [place] and [place] locations is the same.  [facility]gent [facility] and [facility] [address] [place] [phone]   [facility] [address] [place] or [address] [phone]   [facility] [address] [place] [phone], phone; [phone], fax Sees patients 1st and 3rd Saturday of every month.  Must not qualify for public or private insurance (i.e. Medicaid, Medicare, [place] Health Choice, Veterans' Benefits)  Household income should be no more than 200% of the poverty level The clinic cannot treat you if you are pregnant or think you are pregnant  Sexually transmitted diseases are not treated at the clinic.    Dental Care: Organization         Address  Phone  Notes  [facility] Department of [facility] [address] [place], [place] [phone] Accepts children up to age 58 who are enrolled in [place] or [place] Health Choice; pregnant women with a Medicaid card; and children who have applied for Medicaid or [place] Health Choice, but were declined, whose parents can pay a reduced fee at time of service.  [facility] Department of  [facility] [place]  [address], [place] [phone] Accepts children up to age 48 who are enrolled in Medicaid or [place] Health Choice; pregnant women with a Medicaid card; and children who have applied for Medicaid or [place] Health Choice, but were declined, whose parents can pay a reduced fee at time of service.  [facility]  [address], [place] [phone] Patients are seen by appointment only. Walk-ins are not accepted. [facility] will see patients 48 years of age and older. Monday - Tuesday (8am-5pm) Most Wednesdays (8:30-5pm) $30 per visit, cash only  [facility]  [address], [facility] [phone] Patients are seen by appointment only. Walk-ins are not accepted. [facility] will see patients 48 years of age and older. One Wednesday Evening (Monthly: Volunteer Based).  $30 per visit, cash only  [employer] [facility]  [phone] for adults; Children under age 674, call Graduate Pediatric Dentistry at [phone]  [phone]. Children aged 364-14, please call [phone] to request a pediatric application.  Dental services are provided in all areas of dental care including fillings, crowns and bridges, complete and partial dentures, implants, gum treatment, root canals, and extractions. Preventive care is also provided. Treatment is provided to both adults and children. Patients are selected via a lottery and there is often a waiting list.   [facility] [address], [place]  [phone] [email or web address]   [facility] [address] [place] [address], [place], [place] [phone], Ext. [phone] Second and Fourth Thursday of each month, opens at 6:30 AM; Clinic ends at 9 AM.  Patients are seen on a first-come first-served basis, and a limited number are seen during each clinic.   [facility]  [address], [place], [place] [phone]   Eligibility Requirements You must have lived in [place], [place], or [place] counties for at least the last three months.   You cannot be eligible for state or federal sponsored National Cityhealthcare insurance, including CIGNAVeterans Administration, [place]icaid, or [employer]icare.   You generally cannot be eligible for healthcare insurance through your employer.    How to apply: Eligibility screenings are held every Tuesday and Wednesday afternoon from 1:00 pm until 4:00 pm. You do not need an appointment for the interview!  [facility] [address], [place], [place] [phone]   [facility]  [phone]   [facility]  [phone]   [facility]  [phone]    Behavioral Health Resources in the Community: Intensive Outpatient Programs Organization         Address  Phone  Notes  [facility] [address], [place], [place] [phone]   [facility] [address], [place], [place] [phone]   [facility]  [address], [place], [place]  [phone]   [facility] [address],  [place], [place] [phone] or [phone]   Substance Abuse Resources Organization         Address  Phone  Notes  [facility]  [phone]   [facility]  [phone]   [facility]  [phone]   [facility]  [phone]   Residential & Outpatient Substance Abuse Program  [phone]   Psychological Services Organization         Address  Phone  Notes  [facility]  [phone]   [facility]  [phone]- [phone]   [facility] [address], [place] [phone] or [phone]    Mobile Crisis Teams Organization         Address  Phone  Notes  [facility], Mobile Crisis Care Unit  [phone]   [facility]  [address] [place], [place] [phone]   Doristine LocksSharon DeEsch [address] [place] [phone]    Self-Help/Support Groups Organization         Address  Phone             Notes  [facility] [place] - variety of support groups  336- [phone] Call for more information  Narcotics Anonymous (NA), [facility] [address], [place]  2 meetings at this location   Statisticianesidential Treatment Programs Organization         Address  Phone  Notes  [facility] [address],    [place]  [phone]   [facility]  [address], [place] [phone], [place], [place] [phone]   [facility] [address], [place] [phone] Admissions: 8am-3pm M-F  [facility] [address]    [place], [place] [phone]   [facility] [address], [place], [place] [phone]   [facility] [address]  [place], [place] [phone]   [facility] - Intensive Outpatient [address], [place], [place]  [phone]   [facility] ([facility] [address]  [place], [place] [phone] or [phone]   [facility] ([facility]) [address] [place], [place] [phone] Accepts Medicaid  [facility] [address]  [place] [phone] Substance Abuse/Addiction Treatment   [facility] Resources Organization         Address  Phone  Notes  [facility]  [phone]   Angie FavaJulie Brannon, PhD [address], [address] [place], [place]   [phone]) [phone] or [phone]   [facility]   [address] [place], [place] [phone](336) [phone]   [facility] [address], [place], [place] [phone] Insurance/Medicaid/sponsorship through [facility]  [facility] [address]                                    [place], [place] [phone] Therapy/tele-psych/case  [facility] [address]   [place], [place] [phone](336) [phone]    Dr. Lolly MustacheArfeen  [phone]   [facility]  [employer] [facility] 1) [address], [place] 2) [address], [place] 3)  [address], [place] [phone](336) [phone]  [phone]([phone]   [facility] Child Abuse Hotline [phone](336) [phone] or [phone]) [phone] (After Hours)

## 2014-05-08 NOTE — ED Notes (Signed)
Social work called again by Diplomatic Services operational officersecretary about 30-6140mins ago. Still hasn't arrived and PT wants to leave

## 2014-05-08 NOTE — ED Notes (Signed)
PT stated that his last name is Colton Lee, not Colton Lee. Called registration to come and make changes

## 2014-05-08 NOTE — ED Notes (Signed)
Patient presents today with a chief complaint of bilateral ankle and foot edema intermittently for 5 years. Patient reports he walks a lot due to inability to obtain a drivers license at this time. Patient also reports numbness and tingling to bilateral feet.

## 2014-05-08 NOTE — ED Notes (Signed)
Gave PT additional coffee. PT asked for some foot support - provider notified

## 2014-05-10 NOTE — ED Provider Notes (Signed)
Medical screening examination/treatment/procedure(s) were conducted as a shared visit with non-physician practitioner(s) and myself.  I personally evaluated the patient during the encounter.   EKG Interpretation None      Pt presents w/ chronic BL feet pain, has been seen multiple times for same and has not followed up w/ a podiatrist as recommended. Doubt acute emergent process, no signs of acute infection. Have again told pt of importance of f/u as this is not appropriate reason for reapt ED visits with our limited resources.   Shanna CiscoMegan E Docherty, MD 05/10/14 [phone]

## 2015-01-20 ENCOUNTER — Encounter (HOSPITAL_COMMUNITY): Payer: Self-pay | Admitting: Emergency Medicine

## 2015-01-20 ENCOUNTER — Emergency Department (HOSPITAL_COMMUNITY)
Admission: EM | Admit: 2015-01-20 | Discharge: 2015-01-20 | Disposition: A | Discharge status: Home / Self Care | Payer: Self-pay | Attending: Emergency Medicine | Admitting: Emergency Medicine

## 2015-01-20 DIAGNOSIS — Z720 Tobacco use: Secondary | ICD-10-CM | POA: Insufficient documentation

## 2015-01-20 DIAGNOSIS — B353 Tinea pedis: Secondary | ICD-10-CM | POA: Insufficient documentation

## 2015-01-20 MED ORDER — CLOTRIMAZOLE 1 % EX CREA: TOPICAL_CREAM | CUTANEOUS | Status: DC

## 2015-01-20 NOTE — ED Notes (Addendum)
Pt c/o foot pain.  Hx of same. Calloused feet.. Alert and oriented. Homeless.

## 2015-01-20 NOTE — Discharge Instructions (Signed)

## 2015-01-20 NOTE — ED Provider Notes (Signed)
CSN: [id number]     Arrival date & time 01/20/15  0105 History   First MD Initiated Contact with Patient 01/20/15 0235     Chief Complaint  Patient presents with  . Foot Pain     (Consider location/radiation/quality/duration/timing/severity/associated sxs/prior Treatment) Patient is a 49 y.o. male presenting with lower extremity pain. The history is provided by the patient. No language interpreter was used.  Foot Pain This is a chronic problem. Pertinent negatives include no fever. Associated symptoms comments: He complains of bilateral foot pain that is chronic. No outpatient treatment. No fever, draining lesion, wound or known injury. .    Past Medical History  Diagnosis Date  . Foot pain    Past Surgical History  Procedure Laterality Date  . Hernia repair     History reviewed. No pertinent family history. History  Substance Use Topics  . Smoking status: Current Some Day Smoker    Types: Cigars  . Smokeless tobacco: Not on file  . Alcohol Use: Yes     Comment: rarely    Review of Systems  Constitutional: Negative for fever.  Musculoskeletal:       See HPI.  Skin: Negative for wound.      Allergies  Aspirin and Tetanus toxoids  Home Medications   Prior to Admission medications   Medication Sig Start Date End Date Taking? Authorizing Provider  naproxen sodium (ANAPROX) 220 MG tablet Take 440-660 mg by mouth 2 (two) times daily as needed.   Yes Historical Provider, MD  HYDROcodone-acetaminophen (NORCO/VICODIN) 5-325 MG per tablet Take 1 tablet by mouth every 6 (six) hours as needed for moderate pain or severe pain. Patient not taking: Reported on 01/20/2015 05/08/14   Marissa Sciacca, PA-C   BP 144/87 mmHg  Pulse 72  Temp(Src) 97.8 F (36.6 C) (Oral)  SpO2 96% Physical Exam  Constitutional: He is oriented to person, place, and time. He appears well-developed and well-nourished.  Neck: Normal range of motion.  Pulmonary/Chest: Effort normal.  Musculoskeletal:  Normal range of motion.  Feet bilaterally calloused without bleeding lesion or ulceration. There is a wet rash in interphalangeal spaces c/w tinea.   Neurological: He is alert and oriented to person, place, and time.  Skin: Skin is warm and dry.  Psychiatric: He has a normal mood and affect.    ED Course  Procedures (including critical care time) Labs Review Labs Reviewed - No data to display  Imaging Review No results found.   EKG Interpretation None      MDM   Final diagnoses:  None    1. Tinea pedis  Patient presents without uncomplicated tinea pedis similar to multiple previous ED visits. Stable for discharge.     Arnoldo HookerShari A Kayani Rapaport, PA-C 01/20/15 [phone]  Olivia Mackielga M Otter, MD 01/20/15 [phone]

## 2015-01-25 ENCOUNTER — Encounter (HOSPITAL_COMMUNITY): Payer: Self-pay | Admitting: Emergency Medicine

## 2015-01-25 ENCOUNTER — Emergency Department (HOSPITAL_COMMUNITY)
Admission: EM | Admit: 2015-01-25 | Discharge: 2015-01-25 | Disposition: A | Discharge status: Home / Self Care | Payer: Self-pay | Attending: Emergency Medicine | Admitting: Emergency Medicine

## 2015-01-25 DIAGNOSIS — Z720 Tobacco use: Secondary | ICD-10-CM | POA: Insufficient documentation

## 2015-01-25 DIAGNOSIS — R600 Localized edema: Secondary | ICD-10-CM

## 2015-01-25 DIAGNOSIS — R2243 Localized swelling, mass and lump, lower limb, bilateral: Secondary | ICD-10-CM | POA: Insufficient documentation

## 2015-01-25 DIAGNOSIS — B353 Tinea pedis: Secondary | ICD-10-CM | POA: Insufficient documentation

## 2015-01-25 LAB — I-STAT CHEM 8, ED
BUN: 18 mg/dL (ref 6–23)
CHLORIDE: 103 mmol/L (ref 96–112)
Calcium, Ion: 1.11 mmol/L — ABNORMAL LOW (ref 1.12–1.23)
Creatinine, Ser: 1.1 mg/dL (ref 0.50–1.35)
Glucose, Bld: 106 mg/dL — ABNORMAL HIGH (ref 70–99)
HCT: 44 % (ref 39.0–52.0)
Hemoglobin: 15 g/dL (ref 13.0–17.0)
Potassium: 4 mmol/L (ref 3.5–5.1)
SODIUM: 137 mmol/L (ref 135–145)
TCO2: 25 mmol/L (ref 0–100)

## 2015-01-25 MED ORDER — HYDROCODONE-ACETAMINOPHEN 5-325 MG PO TABS
1.0000 | ORAL_TABLET | Freq: Once | ORAL | Status: AC
  Administered 2015-01-25: 1 via ORAL
  Filled 2015-01-25: qty 1

## 2015-01-25 NOTE — ED Notes (Signed)
No answer x 2 in waiting room.

## 2015-01-25 NOTE — ED Notes (Signed)
Found pt sleeping.

## 2015-01-25 NOTE — ED Notes (Signed)
Pt. Left with all belongings 

## 2015-01-25 NOTE — Discharge Instructions (Signed)
Athlete's Foot Athlete's foot (tinea pedis) is a fungal infection of the skin on the feet. It often occurs on the skin between the toes or underneath the toes. It can also occur on the soles of the feet. Athlete's foot is more likely to occur in hot, humid weather. Not washing your feet or changing your socks often enough can contribute to athlete's foot. The infection can spread from person to person (contagious). CAUSES Athlete's foot is caused by a fungus. This fungus thrives in warm, moist places. Most people get athlete's foot by sharing shower stalls, towels, and wet floors with an infected person. People with weakened immune systems, including those with diabetes, may be more likely to get athlete's foot. SYMPTOMS   Itchy areas between the toes or on the soles of the feet.  White, flaky, or scaly areas between the toes or on the soles of the feet.  Tiny, intensely itchy blisters between the toes or on the soles of the feet.  Tiny cuts on the skin. These cuts can develop a bacterial infection.  Thick or discolored toenails. DIAGNOSIS  Your caregiver can usually tell what the problem is by doing a physical exam. Your caregiver may also take a skin sample from the rash area. The skin sample may be examined under a microscope, or it may be tested to see if fungus will grow in the sample. A sample may also be taken from your toenail for testing. TREATMENT  Over-the-counter and prescription medicines can be used to kill the fungus. These medicines are available as powders or creams. Your caregiver can suggest medicines for you. Fungal infections respond slowly to treatment. You may need to continue using your medicine for several weeks. PREVENTION   Do not share towels.  Wear sandals in wet areas, such as shared locker rooms and shared showers.  Keep your feet dry. Wear shoes that allow air to circulate. Wear cotton or wool socks. HOME CARE INSTRUCTIONS   Take medicines as directed by  your caregiver. Do not use steroid creams on athlete's foot.  Keep your feet clean and cool. Wash your feet daily and dry them thoroughly, especially between your toes.  Change your socks every day. Wear cotton or wool socks. In hot climates, you may need to change your socks 2 to 3 times per day.  Wear sandals or canvas tennis shoes with good air circulation.  If you have blisters, soak your feet in Burow's solution or Epsom salts for 20 to 30 minutes, 2 times a day to dry out the blisters. Make sure you dry your feet thoroughly afterward. SEEK MEDICAL CARE IF:   You have a fever.  You have swelling, soreness, warmth, or redness in your foot.  You are not getting better after 7 days of treatment.  You are not completely cured after 30 days.  You have any problems caused by your medicines. MAKE SURE YOU:   Understand these instructions.  Will watch your condition.  Will get help right away if you are not doing well or get worse. Document Released: 11/05/2000 Document Revised: 01/31/2012 Document Reviewed: 08/27/2011 [facility] Patient Information 2015 [place], [place]. This information is not intended to replace advice given to you by your health care provider. Make sure you discuss any questions you have with your health care provider.  Peripheral Edema You have swelling in your legs (peripheral edema). This swelling is due to excess accumulation of salt and water in your body. Edema may be a sign of heart,  kidney or liver disease, or a side effect of a medication. It may also be due to problems in the leg veins. Elevating your legs and using special support stockings may be very helpful, if the cause of the swelling is due to poor venous circulation. Avoid long periods of standing, whatever the cause. Treatment of edema depends on identifying the cause. Chips, pretzels, pickles and other salty foods should be avoided. Restricting salt in your diet is almost always needed. Water pills  (diuretics) are often used to remove the excess salt and water from your body via urine. These medicines prevent the kidney from reabsorbing sodium. This increases urine flow. Diuretic treatment may also result in lowering of potassium levels in your body. Potassium supplements may be needed if you have to use diuretics daily. Daily weights can help you keep track of your progress in clearing your edema. You should call your caregiver for follow up care as recommended. SEEK IMMEDIATE MEDICAL CARE IF:   You have increased swelling, pain, redness, or heat in your legs.  You develop shortness of breath, especially when lying down.  You develop chest or abdominal pain, weakness, or fainting.  You have a fever. Document Released: 12/16/2004 Document Revised: 01/31/2012 Document Reviewed: 11/26/2009 [facility] Patient Information 2015 [place]. This information is not intended to replace advice given to you by your health care provider. Make sure you discuss any questions you have with your health care provider.

## 2015-01-25 NOTE — ED Provider Notes (Signed)
CSN: [id number]     Arrival date & time 01/25/15  0027 History   First MD Initiated Contact with Patient 01/25/15 [phone]     Chief Complaint  Patient presents with  . Foot Swelling     (Consider location/radiation/quality/duration/timing/severity/associated sxs/prior Treatment) HPI  This is a patient with a history of recurrent foot pain who presents with complaints of bilateral foot pain and leg swelling. Patient is difficult to obtain history from. He reports that he has had progressive worsening bilateral foot pain and swelling since 2010. He has not seen a doctor and does not take any medications. He denies any fevers. He states "it just got worse today." Patient states that the pain is worse with ambulation. Current pain is 8 out of 10. He is not taking anything for his pain. No history of heart failure.  Past Medical History  Diagnosis Date  . Foot pain    Past Surgical History  Procedure Laterality Date  . Hernia repair     No family history on file. History  Substance Use Topics  . Smoking status: Current Some Day Smoker    Types: Cigars  . Smokeless tobacco: Not on file  . Alcohol Use: Yes     Comment: rarely    Review of Systems  Constitutional: Negative.  Negative for fever.  Respiratory: Negative for chest tightness.   Cardiovascular: Positive for leg swelling. Negative for chest pain.  Gastrointestinal: Negative.  Negative for abdominal pain.  Genitourinary: Negative.  Negative for dysuria.  Musculoskeletal:       Bilateral foot pain  Skin: Negative for rash.  All other systems reviewed and are negative.     Allergies  Aspirin and Tetanus toxoids  Home Medications   Prior to Admission medications   Medication Sig Start Date End Date Taking? Authorizing Provider  naproxen sodium (ANAPROX) 220 MG tablet Take 440-660 mg by mouth 2 (two) times daily as needed.   Yes Historical Provider, MD  clotrimazole (LOTRIMIN) 1 % cream Apply to affected area 2 times  daily Patient not taking: Reported on 01/25/2015 01/20/15   Melvenia Beam A Upstill, PA-C  HYDROcodone-acetaminophen (NORCO/VICODIN) 5-325 MG per tablet Take 1 tablet by mouth every 6 (six) hours as needed for moderate pain or severe pain. Patient not taking: Reported on 01/20/2015 05/08/14   Marissa Sciacca, PA-C   BP 136/77 mmHg  Pulse 85  Temp(Src) 99 F (37.2 C) (Oral)  Resp 22  SpO2 98% Physical Exam  Constitutional:  Disheveled, no acute distress  HENT:  Head: Normocephalic and atraumatic.  Cardiovascular: Normal rate and regular rhythm.   Pulmonary/Chest: Effort normal. No respiratory distress.  Musculoskeletal:  1+ bilateral lower extremity edema to the calf, symmetrical, chronic blistering of the plantar aspect of the bilateral feet with evidence of tenia pedis, no redness or erythema noted  Neurological: He is alert.  Skin:  Dry and cracking on the bilateral feet  Psychiatric:  Strange affect  Nursing note and vitals reviewed.   ED Course  Procedures (including critical care time) Labs Review Labs Reviewed  I-STAT CHEM 8, ED - Abnormal; Notable for the following:    Glucose, Bld 106 (*)    Calcium, Ion 1.11 (*)    All other components within normal limits    Imaging Review No results found.   EKG Interpretation None      MDM   Final diagnoses:  Bilateral leg edema  Tinea pedis of both feet    Patient presents with bilateral foot pain  and edema. Complaints appear chronic in nature. Screening chem 8 reassuring. Suspect patient's pain and swelling is secondary to prolonged standing and walking. No evidence of acute infection. [facility].  After history, exam, and medical workup I feel the patient has been appropriately medically screened and is safe for discharge home. Pertinent diagnoses were discussed with the patient. Patient was given return precautions.     Shon Batonourtney F Horton, MD 01/25/15 [phone]

## 2015-01-25 NOTE — ED Notes (Signed)
Called pt x 1 with no answer in waiting room.

## 2015-01-25 NOTE — ED Notes (Signed)
Pt c/o continued bilateral foot/leg swelling and pain since 2010 after he was bitten by he thinks was a spider. R seems to be more painful and L.

## 2015-01-26 ENCOUNTER — Emergency Department (HOSPITAL_COMMUNITY)
Admission: EM | Admit: 2015-01-26 | Discharge: 2015-01-26 | Disposition: A | Discharge status: Home / Self Care | Payer: Self-pay | Attending: Emergency Medicine | Admitting: Emergency Medicine

## 2015-01-26 ENCOUNTER — Encounter (HOSPITAL_COMMUNITY): Payer: Self-pay | Admitting: *Deleted

## 2015-01-26 DIAGNOSIS — M79673 Pain in unspecified foot: Secondary | ICD-10-CM

## 2015-01-26 DIAGNOSIS — Z720 Tobacco use: Secondary | ICD-10-CM | POA: Insufficient documentation

## 2015-01-26 DIAGNOSIS — Z791 Long term (current) use of non-steroidal anti-inflammatories (NSAID): Secondary | ICD-10-CM | POA: Insufficient documentation

## 2015-01-26 DIAGNOSIS — R609 Edema, unspecified: Secondary | ICD-10-CM | POA: Insufficient documentation

## 2015-01-26 DIAGNOSIS — G8929 Other chronic pain: Secondary | ICD-10-CM | POA: Insufficient documentation

## 2015-01-26 DIAGNOSIS — M79671 Pain in right foot: Secondary | ICD-10-CM | POA: Insufficient documentation

## 2015-01-26 DIAGNOSIS — M79672 Pain in left foot: Secondary | ICD-10-CM | POA: Insufficient documentation

## 2015-01-26 DIAGNOSIS — Z79899 Other long term (current) drug therapy: Secondary | ICD-10-CM | POA: Insufficient documentation

## 2015-01-26 NOTE — ED Provider Notes (Signed)
CSN: [id number]     Arrival date & time 01/26/15  0049 History  This chart was scribed for Lyanne CoKevin M Sahithi Ordoyne, MD by Murriel HopperAlec Bankhead, ED Scribe. This patient was seen in room A11C/A11C and the patient's care was started at 2:08 AM.    Chief Complaint  Patient presents with  . Leg Pain     The history is provided by the patient. No language interpreter was used.     HPI Comments: Colton Lee is a [age] y.o. male who presents to the Emergency Department complaining of bilateral lower left and feet pain with associated swelling that has been present for a few years. Pt states that he is on his feet and wears shoes for long hours. Pt denies any known injury to the area. Pt also states that he has had an intermittent headache for the past couple of years as well.  He denies fevers and chills.  Denies abdominal pain.  No chest pain or shortness of breath.  He is currently without a primary care physician.    Past Medical History  Diagnosis Date  . Foot pain    Past Surgical History  Procedure Laterality Date  . Hernia repair     No family history on file. History  Substance Use Topics  . Smoking status: Current Some Day Smoker    Types: Cigars  . Smokeless tobacco: Not on file  . Alcohol Use: Yes     Comment: rarely    Review of Systems  A complete 10 system review of systems was obtained and all systems are negative except as noted in the HPI and PMH.    Allergies  Aspirin and Tetanus toxoids  Home Medications   Prior to Admission medications   Medication Sig Start Date End Date Taking? Authorizing Provider  clotrimazole (LOTRIMIN) 1 % cream Apply to affected area 2 times daily Patient not taking: Reported on 01/25/2015 01/20/15   Melvenia BeamShari A Upstill, PA-C  HYDROcodone-acetaminophen (NORCO/VICODIN) 5-325 MG per tablet Take 1 tablet by mouth every 6 (six) hours as needed for moderate pain or severe pain. Patient not taking: Reported on 01/20/2015 05/08/14   Marissa Sciacca, PA-C   naproxen sodium (ANAPROX) 220 MG tablet Take 440-660 mg by mouth 2 (two) times daily as needed.    Historical Provider, MD   BP 130/94 mmHg  Pulse 100  Temp(Src) 99.1 F (37.3 C) (Oral)  Resp 18  Ht 6\' 5"  (1.956 m)  SpO2 98% Physical Exam  Constitutional: He is oriented to person, place, and time. He appears well-developed and well-nourished.  HENT:  Head: Normocephalic and atraumatic.  Eyes: EOM are normal.  Neck: Normal range of motion.  Cardiovascular: Normal rate, regular rhythm, normal heart sounds and intact distal pulses.   Pulmonary/Chest: Effort normal and breath sounds normal. No respiratory distress.  Abdominal: Soft. He exhibits no distension. There is no tenderness.  Musculoskeletal: Normal range of motion.  Chronic scaling of both feet Poor foot hygiene Normal PT and DP pulses bilaterally 1+ edema bilaterally through lower extremities  No erythema or warmth of his feet   Neurological: He is alert and oriented to person, place, and time.  Skin: Skin is warm and dry.  Psychiatric: He has a normal mood and affect. Judgment normal.  Nursing note and vitals reviewed.   ED Course  Procedures (including critical care time)  DIAGNOSTIC STUDIES: Oxygen Saturation is 98% on RA, normal by my interpretation.    COORDINATION OF CARE: 2:17 AM Discussed treatment  plan with pt at bedside and pt agreed to plan.   Labs Review Labs Reviewed - No data to display  Imaging Review No results found.   EKG Interpretation None      MDM   Final diagnoses:  Chronic foot pain, unspecified laterality    The patient's symptoms today are consistent with chronic conditions.  Medical screening examination completed.  No life-threatening emergency.  The patient was [facility] the [facility] for [facility] care.  No indication for additional workup emergency department.  I personally performed the services described in this documentation, which was scribed in my  presence. The recorded information has been reviewed and is accurate.       Lyanne Co, MD 01/26/15 [phone]

## 2015-01-26 NOTE — ED Notes (Signed)
The pt has had bi-lateral lower leg tib fib pain for 2 weeks .  No known injury.  More pain in his rt tib fib than his lt

## 2015-01-26 NOTE — Discharge Instructions (Signed)
°Emergency Department Resource Guide °1) Find a Doctor and Pay Out of Pocket °Although you won't have to find out who is covered by your insurance plan, it is a good idea to ask around and get recommendations. You will then need to call the office and see if the doctor you have chosen will accept you as a new patient and what types of options they offer for patients who are self-pay. Some doctors offer discounts or will set up payment plans for their patients who do not have insurance, but you will need to ask so you aren't surprised when you get to your appointment. ° °2) Contact Your Local Health Department °Not all health departments have doctors that can see patients for sick visits, but many do, so it is worth a call to see if yours does. If you don't know where your local health department is, you can check in your phone book. The CDC also has a tool to help you locate your state's health department, and many state websites also have listings of all of their local health departments. ° °3) Find a Walk-in Clinic °If your illness is not likely to be very severe or complicated, you may want to try a walk in clinic. These are popping up all over the country in pharmacies, drugstores, and shopping centers. They're usually staffed by nurse practitioners or physician assistants that have been trained to treat common illnesses and complaints. They're usually fairly quick and inexpensive. However, if you have serious medical issues or chronic medical problems, these are probably not your best option. ° °No Primary Care Doctor: °- Call [facility] at  832-8000 - they can help you locate a primary care doctor that  accepts your insurance, provides certain services, etc. °- Physician Referral Service- 1-800-533-3463 ° °Chronic Pain Problems: °Organization         Address  Phone   Notes  °[facility]  (336) 297-2271 Patients need to be referred by their primary care doctor.  ° °Medication  Assistance: °Organization         Address  Phone   Notes  °[place] [facility] [address] °[place], [place] [address] (336) 641-8030 --Must be a resident of [place] °-- Must have NO insurance coverage whatsoever (no Medicaid/ Medicare, etc.) °-- The pt. MUST have a primary care doctor that directs their care regularly and follows them in the community °  °[facility]  (866) 331-1348   °[employer]  (888) 892-1162   ° °Agencies that provide inexpensive medical care: °Organization         Address  Phone   Notes  °[facility]  (336) 832-8035   °[facility]    (336) 832-7272   °[facility] [address] °[place], [place] [address] (336) 832-4777   °[facility] [address], °[place] (336) 271-4999   °[facility]    (336) 373-0678   °[facility]    (336) 272-1050   °[facility] ° [address], [place] Phone:  (336) 832-4444, Fax:  (336) 832-4440 Hours of Operation:  9 am - 6 pm, M-F.  Also accepts Medicaid/Medicare and self-pay.  °[facility] ° [address], [place] Phone: (336) 832-3150, Fax: (336) 832-3151. Hours of Operation:  8:30 am - 5:30 pm, M-F.  Also accepts Medicaid and self-pay.  °[facility] [place] [address], [place] Phone: (336) 878-6027   °[facility] [address], [place], [place] (336)723-1848, Ext. 123 Mondays & Thursdays: 7-9 AM.  First 15 patients are seen on a first come, first serve basis. °  ° °Medicaid-accepting [place] Providers: ° °Organization         Address  Phone   Notes  °[facility] [address], [place] (336) 641-2100 Also accepts self-pay patients.  °[facility] [address], [place] ° (336) 856-9996   °[facility] [address], [place]  (336) 288-8857   °[facility] [address] [place] [address], [place] (336) 299-7000   °Veita Bland [address], [place]  ° (336) 373-1557 Only accepts [place] Access Medicaid patients after they have their name applied to their card.  ° °Self-Pay (no insurance) in [place]: ° °Organization         Address  Phone   Notes  °Sickle Cell Patients, [facility] [address], [place] (336) 832-1970   °[facility] [address], [place] (336) 832-4400   °[facility] ° [address], [place] (336) 992-4800   °[facility]/Dr. Osei-Bonsu ° [address] [place] [address], [place] or [address], [place] (336) [phone] Phone number for both [place] and [place] locations is the same.  °[facility] [address], [place] (336) 299-0000   °[facility] [address] [place] [address], [place] or [address] (336) 852-7530 °(336) 878-2260   °[facility] [address], [place] (336) 350-1642, phone; (336) 294-5005, fax Sees patients 1st and 3rd Saturday of every month.  Must not qualify for public or private insurance (i.e. Medicaid, Medicare, [place] Health Choice, Veterans' Benefits) • Household income should be no more than 200% of the poverty level •The clinic cannot treat you if you are pregnant or think you are pregnant • Sexually transmitted diseases are not treated at the clinic.  ° ° °Dental Care: °Organization         Address  Phone  Notes  °[place] [facility] [address], [place] (336) 641-6152 Accepts children up to age 21 who are enrolled in Medicaid or [place] Health Choice; pregnant women with a Medicaid card; and children who have applied for Medicaid or [place] Health Choice, but were declined, whose parents can pay a reduced fee at time of service.  °[place]  [facility] [place]  [address], [place] (336) 641-7733 Accepts children up to age 21 who are enrolled in Medicaid or [place] Health Choice; pregnant women with a Medicaid card; and children who have applied for Medicaid or [place] Health Choice, but were declined, whose parents can pay a reduced fee at time of service.  °[facility] ° [address], [place] (336) 641-4533 Patients are seen by appointment only. Walk-ins are not accepted. [facility] will see patients 18 years of age and older. °Monday - Tuesday (8am-5pm) °Most Wednesdays (8:30-5pm) °$30 per visit, cash only  °[facility] ° [address], [place] (336) 641-4533 Patients are seen by appointment only. Walk-ins are not accepted. [facility] will see patients 18 years of age and older. °One   Wednesday Evening (Monthly: Volunteer Based).  $30 per visit, cash only  °[facility]  (919) 537-3737 for adults; Children under age 4, call Graduate Pediatric Dentistry at (919) [phone]. Children aged 4-14, please call (919) [phone] to request a pediatric application. ° Dental services are provided in all areas of dental care including fillings, crowns and bridges, complete and partial dentures, implants, gum treatment, root canals, and extractions. Preventive care is also provided. Treatment is provided to both adults and children. °Patients are selected via a lottery and there is often a waiting list. °  °[facility] [address], °[place] ° (336) 763-8833 [email or web address].[email or web address] °  °[facility] [address], [place], [place] (336)723-1848, Ext. 123 Second and Fourth Thursday of each month, opens at 6:30 AM; Clinic ends at 9 AM.  Patients are seen on a first-come first-served basis, and a limited number are seen during each clinic.  ° °[facility] ° [address], [place], [place] (336) 723-7904    Eligibility Requirements °You must have lived in [place], [place], or [place] counties for at least the last three months. °  You cannot be eligible for state or federal sponsored healthcare insurance, including Veterans Administration, Medicaid, or Medicare. °  You generally cannot be eligible for healthcare insurance through your employer.  °  How to apply: °Eligibility screenings are held every Tuesday and Wednesday afternoon from 1:00 pm until 4:00 pm. You do not need an appointment for the interview!  °[facility] [address], [place], [place] 336-631-2330   °[facility]  336-342-8273   °[facility]  336-703-3100   °[facility]  336-570-6415   ° °Behavioral Health Resources in the Community: °Intensive Outpatient Programs °Organization         Address  Phone  Notes  °[facility] [address], [place], [place] 336-878-6098   °[facility] [address], [place], [place] 336-832-9800   °[facility] [address], [place], [place] ° 336-882-2125   °[facility] [address],  °[place], [place] 1-800-853-5163 or 336-641-4981   °Substance Abuse Resources °Organization         Address  Phone  Notes  °[facility]  336-882-2125   °[facility]  336-784-9470   °[facility]  336-285-9073   °[facility]  336-845-3988   °Residential & Outpatient Substance Abuse Program  1-800-659-3381   °Psychological Services °Organization         Address  Phone  Notes  °[facility]  336- 832-9600   °[facility]  336- 378-7881   °[facility] [address], [place] 1-800-853-5163 or 336-641-4981   ° °Mobile Crisis Teams °Organization         Address  Phone  Notes  °[facility], Mobile Crisis Care Unit  1-877-626-1772   °Assertive °[facility] ° [address]  [place], [place] 336-834-9664   °Sharon DeEsch [address] °[place] 336-554-5454   ° °Self-Help/Support Groups °Organization         Address  Phone             Notes  °[facility]  - variety of support groups  336- 373-1402 Call for more information  °Narcotics Anonymous (NA), [facility] [address], °[place]  2 meetings at this location  ° °  Residential Treatment Programs Organization         Address  Phone  Notes  [facility] [address],    [place]  4-098-119-1478   [facility]  [address] [place] [address], [place], [place] 308-657-8469   [facility] [address] [place], [place] 629-528-4132 Admissions: 8am-3pm M-F  [facility] [address]    [place], [place] 440-102-7253   [facility] [address] [place], [place], [place] 664-403-4742   [facility] [address],  [place], [place] 595-638-7564   [facility] - Intensive Outpatient [address], [place], [place] 332-951-8841   [facility] ([facility] [address] [place]  [place], [place] 6-606-301-6010 or [phone]   [facility] ([facility] [address] [place], [place] 025-427-0623 Accepts Medicaid  [facility] [address]  [place] 7-628-315-1761 Substance Abuse/Addiction Treatment   [facility]         Address  Phone  Notes  [facility]  815-616-8879   Angie Fava, PhD [address] [place], [place]   762-573-7222 or 647-450-1796   [facility]   [address] [place], [place] (980)234-0001   [facility] [address], [place], [place] 386-022-3253 Insurance/Medicaid/sponsorship through [facility] [address]                                    [place], [place] (727) 039-0818 Therapy/tele-psych/case    [facility] [address], [place] (786) [phone]    Dr. Lolly Mustache  6477862936   [facility]  [facility] Dept. 1) [address] [place] 2) [address], [place] 3)  [address], [place] [phone] (580)[phone]  (212)[phone]   [facility] Child Abuse Hotline 228-472-9638 or 681-299-1012 (After Hours)      Edema Edema is an abnormal buildup of fluids in your bodytissues. Edema is somewhatdependent on gravity to pull the fluid to the lowest place in your body. That makes the condition more common in the legs and thighs (lower extremities). Painless swelling of the feet and ankles is common and becomes more likely as you get older. It is also common in looser tissues, like around your eyes.  When the affected area is squeezed, the fluid may move out of that spot and leave a dent for a few moments. This dent is called pitting.  CAUSES  There are many possible causes of edema. Eating too much salt and being on your feet or sitting for a long time can cause edema in your legs and ankles. Hot weather may make edema worse. Common medical causes of edema include:  Heart failure.  Liver disease.  Kidney disease.  Weak blood vessels in your legs.  Cancer.  An injury.  Pregnancy.  Some medications.  Obesity. SYMPTOMS  Edema is usually painless.Your skin may look swollen or shiny.  DIAGNOSIS  Your health care provider may be able to diagnose edema by asking about your medical history and doing a physical exam. You may need to have tests such as X-rays, an electrocardiogram, or blood tests to check for medical conditions that may cause edema.  TREATMENT  Edema  treatment depends on the cause. If you have heart, liver, or kidney disease, you need the treatment appropriate for these conditions. General treatment may include:  Elevation of the affected body part above the level of your  heart.  Compression of the affected body part. Pressure from elastic bandages or support stockings squeezes the tissues and forces fluid back into the blood vessels. This keeps fluid from entering the tissues.  Restriction of fluid and salt intake.  Use of a water pill (diuretic). These medications are appropriate only for some types of edema. They pull fluid out of your body and make you urinate more often. This gets rid of fluid and reduces swelling, but diuretics can have side effects. Only use diuretics as directed by your health care provider. HOME CARE INSTRUCTIONS   Keep the affected body part above the level of your heart when you are lying down.   Do not sit still or stand for prolonged periods.   Do not put anything directly under your knees when lying down.  Do not wear constricting clothing or garters on your upper legs.   Exercise your legs to work the fluid back into your blood vessels. This may help the swelling go down.   Wear elastic bandages or support stockings to reduce ankle swelling as directed by your health care provider.   Eat a low-salt diet to reduce fluid if your health care provider recommends it.   Only take medicines as directed by your health care provider. SEEK MEDICAL CARE IF:   Your edema is not responding to treatment.  You have heart, liver, or kidney disease and notice symptoms of edema.  You have edema in your legs that does not improve after elevating them.   You have sudden and unexplained weight gain. SEEK IMMEDIATE MEDICAL CARE IF:   You develop shortness of breath or chest pain.   You cannot breathe when you lie down.  You develop pain, redness, or warmth in the swollen areas.   You have heart, liver, or kidney disease and suddenly get edema.  You have a fever and your symptoms suddenly get worse. MAKE SURE YOU:   Understand these instructions.  Will watch your condition.  Will get help right away if you are not  doing well or get worse. Document Released: 11/08/2005 Document Revised: 03/25/2014 Document Reviewed: 08/31/2013 [facility] Patient Information 2015 [place] [place]. This information is not intended to replace advice given to you by your health care provider. Make sure you discuss any questions you have with your health care provider.

## 2015-02-02 ENCOUNTER — Encounter (HOSPITAL_COMMUNITY): Payer: Self-pay | Admitting: Oncology

## 2015-02-02 ENCOUNTER — Emergency Department (HOSPITAL_COMMUNITY): Payer: Self-pay

## 2015-02-02 ENCOUNTER — Emergency Department (HOSPITAL_COMMUNITY)
Admission: EM | Admit: 2015-02-02 | Discharge: 2015-02-03 | Disposition: A | Discharge status: Home / Self Care | Payer: Self-pay | Attending: Emergency Medicine | Admitting: Emergency Medicine

## 2015-02-02 DIAGNOSIS — Z720 Tobacco use: Secondary | ICD-10-CM | POA: Insufficient documentation

## 2015-02-02 DIAGNOSIS — R52 Pain, unspecified: Secondary | ICD-10-CM

## 2015-02-02 DIAGNOSIS — G8929 Other chronic pain: Secondary | ICD-10-CM | POA: Insufficient documentation

## 2015-02-02 DIAGNOSIS — R51 Headache: Secondary | ICD-10-CM | POA: Insufficient documentation

## 2015-02-02 DIAGNOSIS — Z79899 Other long term (current) drug therapy: Secondary | ICD-10-CM | POA: Insufficient documentation

## 2015-02-02 DIAGNOSIS — M79605 Pain in left leg: Secondary | ICD-10-CM | POA: Insufficient documentation

## 2015-02-02 DIAGNOSIS — R609 Edema, unspecified: Secondary | ICD-10-CM

## 2015-02-02 DIAGNOSIS — R600 Localized edema: Secondary | ICD-10-CM | POA: Insufficient documentation

## 2015-02-02 DIAGNOSIS — M79606 Pain in leg, unspecified: Secondary | ICD-10-CM

## 2015-02-02 DIAGNOSIS — M79604 Pain in right leg: Secondary | ICD-10-CM | POA: Insufficient documentation

## 2015-02-02 DIAGNOSIS — M791 Myalgia: Secondary | ICD-10-CM | POA: Insufficient documentation

## 2015-02-02 NOTE — ED Notes (Signed)
Pt has been sick for several weeks.  Pt has swelling and warmth to b/l lower legs.  HR & BP are elevated.  Pt c/o poor po intake.

## 2015-02-03 LAB — I-STAT CHEM 8, ED
BUN: 19 mg/dL (ref 6–23)
CHLORIDE: 102 mmol/L (ref 96–112)
Calcium, Ion: 1.07 mmol/L — ABNORMAL LOW (ref 1.12–1.23)
Creatinine, Ser: 0.9 mg/dL (ref 0.50–1.35)
Glucose, Bld: 145 mg/dL — ABNORMAL HIGH (ref 70–99)
HCT: 40 % (ref 39.0–52.0)
HEMOGLOBIN: 13.6 g/dL (ref 13.0–17.0)
Potassium: 3.7 mmol/L (ref 3.5–5.1)
Sodium: 138 mmol/L (ref 135–145)
TCO2: 22 mmol/L (ref 0–100)

## 2015-02-03 LAB — CBC
HCT: 37 % — ABNORMAL LOW (ref 39.0–52.0)
Hemoglobin: 11.7 g/dL — ABNORMAL LOW (ref 13.0–17.0)
MCH: 26.2 pg (ref 26.0–34.0)
MCHC: 31.6 g/dL (ref 30.0–36.0)
MCV: 83 fL (ref 78.0–100.0)
Platelets: 331 10*3/uL (ref 150–400)
RBC: 4.46 MIL/uL (ref 4.22–5.81)
RDW: 14.7 % (ref 11.5–15.5)
WBC: 8 10*3/uL (ref 4.0–10.5)

## 2015-02-03 MED ORDER — TRAMADOL HCL 50 MG PO TABS: 50.0000 mg | ORAL_TABLET | Freq: Four times a day (QID) | ORAL | Status: DC | PRN

## 2015-02-03 MED ORDER — HYDROCODONE-ACETAMINOPHEN 5-325 MG PO TABS
2.0000 | ORAL_TABLET | Freq: Once | ORAL | Status: AC
  Administered 2015-02-03: 2 via ORAL
  Filled 2015-02-03: qty 2

## 2015-02-03 NOTE — ED Provider Notes (Signed)
CSN: [id number]     Arrival date & time 02/02/15  2331 History   First MD Initiated Contact with Patient 02/03/15 0002     Chief Complaint  Patient presents with  . Leg Swelling    (Consider location/radiation/quality/duration/timing/severity/associated sxs/prior Treatment) HPI Comments: Patient is a 49 year old male with a history of chronic foot pain who presents to the emergency department for further evaluation of lower extremity swelling. Patient is very poor historian. His thoughts are tangential. He states that he has been having pain and swelling to his bilateral lower extremities for some time. He describes the pain as throbbing. He denies taking any medications for his symptoms and states that pain is worse with walking. He states that he gets headaches intermittently. He states that he has been feeling "ill" for several weeks. This is the patient's third visit to the emergency department in the last week for similar symptoms.  The history is provided by the patient. No language interpreter was used.    Past Medical History  Diagnosis Date  . Foot pain    Past Surgical History  Procedure Laterality Date  . Hernia repair     History reviewed. No pertinent family history. History  Substance Use Topics  . Smoking status: Current Some Day Smoker    Types: Cigars  . Smokeless tobacco: Not on file  . Alcohol Use: Yes     Comment: rarely    Review of Systems  Constitutional: Negative for fever.  Cardiovascular: Positive for leg swelling.  Gastrointestinal: Negative for vomiting.  Musculoskeletal: Positive for myalgias.  Neurological: Positive for headaches.  All other systems reviewed and are negative.   Allergies  Aspirin and Tetanus toxoids  Home Medications   Prior to Admission medications   Medication Sig Start Date End Date Taking? Authorizing Provider  naproxen sodium (ANAPROX) 220 MG tablet Take 440-660 mg by mouth 2 (two) times daily as needed (pain).    Yes  Historical Provider, MD  clotrimazole (LOTRIMIN) 1 % cream Apply to affected area 2 times daily Patient not taking: Reported on 01/25/2015 01/20/15   Elpidio Anis, PA-C  HYDROcodone-acetaminophen (NORCO/VICODIN) 5-325 MG per tablet Take 1 tablet by mouth every 6 (six) hours as needed for moderate pain or severe pain. Patient not taking: Reported on 01/20/2015 05/08/14   Marissa Sciacca, PA-C  traMADol (ULTRAM) 50 MG tablet Take 1 tablet (50 mg total) by mouth every 6 (six) hours as needed. 02/03/15   Antony Madura, PA-C   BP 139/73 mmHg  Pulse 95  Temp(Src) 99.9 F (37.7 C) (Oral)  Resp 20  Ht  (1.956 m)  Wt 250 lb (113.399 kg)  BMI 29.64 kg/m2  SpO2 95%   Physical Exam  Constitutional: He is oriented to person, place, and time. He appears well-developed and well-nourished. No distress.  Nontoxic/nonseptic appearing  HENT:  Head: Normocephalic and atraumatic.  Eyes: Conjunctivae and EOM are normal. No scleral icterus.  Neck: Normal range of motion.  Cardiovascular: Normal rate, regular rhythm and intact distal pulses.   DP and PT pulses 2+ in the bilateral lower extremities.  Pulmonary/Chest: Effort normal. No respiratory distress.  Respirations even and unlabored  Musculoskeletal: Normal range of motion. He exhibits edema.  1+ pitting edema to BLE. No heat to touch compared to surrounding skin. No red linear streaking.  Neurological: He is alert and oriented to person, place, and time. He exhibits normal muscle tone. Coordination normal.  Sensation to light touch intact. DTRs normal and symmetric.  Skin:  Skin is warm and dry. No rash noted. He is not diaphoretic. No erythema. No pallor.  Psychiatric: He has a normal mood and affect. His behavior is normal.  Nursing note and vitals reviewed.   ED Course  Procedures (including critical care time) Labs Review Labs Reviewed  CBC - Abnormal; Notable for the following:    Hemoglobin 11.7 (*)    HCT 37.0 (*)    All other  components within normal limits  I-STAT CHEM 8, ED - Abnormal; Notable for the following:    Glucose, Bld 145 (*)    Calcium, Ion 1.07 (*)    All other components within normal limits    Imaging Review Dg Chest 2 View  02/03/2015   CLINICAL DATA:  Lower extremity swelling. Elevated blood pressure. Tachycardia.  EXAM: CHEST  2 VIEW  COMPARISON:  05/08/2014  FINDINGS: There is a shallow inspiration with mild crowding of the basilar markings. The heart size and mediastinal contours are within normal limits. Both lungs are clear. The visualized skeletal structures are unremarkable.  IMPRESSION: No active cardiopulmonary disease.   Electronically Signed   By: Ellery Plunkaniel R Mitchell M.D.   On: 02/03/2015 00:33     EKG Interpretation None      MDM   Final diagnoses:  Edema, peripheral  Chronic leg pain, unspecified laterality    49 year old male with a history of chronic foot pain and lower extremity edema presents to the emergency department for further evaluation of bilateral foot and leg pain secondary to edema. Patient has been seen for same on 2 occasions in the last week. Patient is very pleasant and conversant. He does not appear toxic or septic. Patient is neurovascularly intact on exam. 1+ pitting edema noted; c/w prior work ups this week. He has no erythema or heat to touch; skin with pink hue, but not concerning for cellulitis. No leukocytosis or fever.  Labs are at baseline. No evidence of fluid overload on CXR. No hypoxia. Will d/c with Tramadol for pain and instruction to wear compression stockings. Have recommended decreased PO salt and processed food intake. [facility]. Return precautions provided. Patient discharged in good condition.   Filed Vitals:   02/02/15 2344 02/03/15 0139 02/03/15 0256  BP: 159/111 174/85 139/73  Pulse: 110 99 95  Temp: 99.1 F (37.3 C) 99.6 F (37.6 C) 99.9 F (37.7 C)  TempSrc: Oral Oral Oral  Resp: 20 18 20   Height:  6\' 5"  (1.956 m)    Weight: 250 lb (113.399 kg)    SpO2: 97% 97% 95%     Antony MaduraKelly Syrah Daughtrey, PA-C 02/03/15 0600  Marisa Severinlga Otter, MD 02/03/15 [phone]

## 2015-02-03 NOTE — Discharge Instructions (Signed)
Wear compression stockings. You can find these at any local pharmacy. Follow up with a primary care doctor. Reduce the amount of salt and processed foods in your diet.  Peripheral Edema You have swelling in your legs (peripheral edema). This swelling is due to excess accumulation of salt and water in your body. Edema may be a sign of heart, kidney or liver disease, or a side effect of a medication. It may also be due to problems in the leg veins. Elevating your legs and using special support stockings may be very helpful, if the cause of the swelling is due to poor venous circulation. Avoid long periods of standing, whatever the cause. Treatment of edema depends on identifying the cause. Chips, pretzels, pickles and other salty foods should be avoided. Restricting salt in your diet is almost always needed. Water pills (diuretics) are often used to remove the excess salt and water from your body via urine. These medicines prevent the kidney from reabsorbing sodium. This increases urine flow. Diuretic treatment may also result in lowering of potassium levels in your body. Potassium supplements may be needed if you have to use diuretics daily. Daily weights can help you keep track of your progress in clearing your edema. You should call your caregiver for follow up care as recommended. SEEK IMMEDIATE MEDICAL CARE IF:   You have increased swelling, pain, redness, or heat in your legs.  You develop shortness of breath, especially when lying down.  You develop chest or abdominal pain, weakness, or fainting.  You have a fever. Document Released: 12/16/2004 Document Revised: 01/31/2012 Document Reviewed: 11/26/2009 [facility] Patient Information 2015 [place], [place]. This information is not intended to replace advice given to you by your health care provider. Make sure you discuss any questions you have with your health care provider.   Emergency Department Resource Guide 1) Find a Doctor and Pay Out of  Pocket Although you won't have to find out who is covered by your insurance plan, it is a good idea to ask around and get recommendations. You will then need to call the office and see if the doctor you have chosen will accept you as a new patient and what types of options they offer for patients who are self-pay. Some doctors offer discounts or will set up payment plans for their patients who do not have insurance, but you will need to ask so you aren't surprised when you get to your appointment.  2) Contact Your Local Health Department Not all health departments have doctors that can see patients for sick visits, but many do, so it is worth a call to see if yours does. If you don't know where your local health department is, you can check in your phone book. The CDC also has a tool to help you locate your state's health department, and many state websites also have listings of all of their local health departments.  3) Find a Walk-in Clinic If your illness is not likely to be very severe or complicated, you may want to try a walk in clinic. These are popping up all over the country in pharmacies, drugstores, and shopping centers. They're usually staffed by nurse practitioners or physician assistants that have been trained to treat common illnesses and complaints. They're usually fairly quick and inexpensive. However, if you have serious medical issues or chronic medical problems, these are probably not your best option.  No Primary Care Doctor: - Call Health Connect at  [phone] - they can help you locate  a primary care doctor that  accepts your insurance, provides certain services, etc. - Physician Referral Service- [phone]  Chronic Pain Problems: Organization         Address  Phone   Notes  [facility]  [phone] Patients need to be referred by their primary care doctor.   Medication Assistance: Organization         Address  Phone   Notes  [facility]  Medication [facility] [address] [place], [place] [address] [phone]) [phone] --Must be a resident of [facility] -- Must have NO insurance coverage whatsoever (no Medicaid/ Medicare, etc.) -- The pt. MUST have a primary care doctor that directs their care regularly and follows them in the community   MedAssist  [phone]) [phone]   [employer]  [phone]) [phone]    Agencies that provide inexpensive medical care: Organization         Address  Phone   Notes  [facility]  [phone]   [facility]    [phone]) [phone]   [facility] [address] [place], [place] [address] [phone]) [phone]   [facility] [place] [address], [place] [phone]   Planned Parenthood    [phone]) [phone]   [facility]    [phone]   [facility]  [address], [place] Phone:  [phone], Fax:  [phone]) [phone] Hours of Operation:  9 am - 6 pm, M-F.  Also accepts Medicaid/Medicare and self-pay.  [facility]  [address], [place] Phone: [phone], Fax: [phone]. Hours of Operation:  8:30 am - 5:30 pm, M-F.  Also accepts Medicaid and self-pay.  [facility] [place] [address], [place] Phone: [phone](336) [phone]   [facility] [address], [place], [place] [phone], Ext. [phone] Mondays & Thursdays: 7-9 AM.  First 15 patients are seen on a first come, first serve basis.    Medicaid-accepting [facility] Providers:  Organization         Address  Phone   Notes  [facility] [address],  [phone] Also accepts self-pay patients.  [facility] [address], [place]  [phone]) [phone]   [facility] [address], [place] [phone]) [phone]   [facility] [address] [place]  [address], [place] [phone]) [phone]   Renaye RakersVeita Bland [address], [place]   [phone]) [phone] Only accepts [place] Access [place]icaid patients after they have their name applied to their card.   Self-Pay (no insurance) in [facility]:  Organization         Address  Phone   Notes  Sickle Cell Patients, [facility] [address], [place] [phone]) [phone]   [facility] [address], [place] [phone]) [phone]   [facility]rier  [address], [place] [phone]) [phone]   [facility]/Dr. Osei-Bonsu  [address] [place] [address], [place] or [phone], [place] [phone] Phone number for both [place] and [place] locations is the same.  [facility] [address], [place] [phone](336) [phone]   [facility]  [address] [place] [address], [place] or [address] [phone]) [phone] (  [phone]   [facility] [address], [place] [phone], phone; [phone], fax Sees patients 1st and 3rd Saturday of every month.  Must not qualify for public or private insurance (i.e. Medicaid, Medicare, [place] Health Choice, Veterans' Benefits)  Household income should be no more than 200% of the poverty level The clinic cannot treat you if you are pregnant or think you are pregnant  Sexually transmitted diseases are not treated at the clinic.    Dental Care: Organization         Address  Phone  Notes  [facility] [employer] [address] [place], [place] [phone] Accepts children up to age 31 who are enrolled in [place] or [place] Health Choice; pregnant women with a Medicaid card; and children who have applied for Medicaid or [place] Health Choice, but were declined, whose parents can pay a reduced fee at time of service.  [facility]  [address], [place]  [phone] Accepts children up to age 83 who are enrolled in [place] or [place] Health Choice; pregnant women with a Medicaid card; and children who have applied for Medicaid or [place] Health Choice, but were declined, whose parents can pay a reduced fee at time of service.  [facility]  [address] [place], [place] [phone] Patients are seen by appointment only. Walk-ins are not accepted. [facility] will see patients 38 years of age and older. Monday - Tuesday (8am-5pm) Most Wednesdays (8:30-5pm) $30 per visit, cash only  [facility] Access [facility]  [address], [facility] [phone] Patients are seen by appointment only. Walk-ins are not accepted. [facility] will see patients 51 years of age and older. One Wednesday Evening (Monthly: Volunteer Based).  $30 per visit, cash only  [employer]  [phone] for adults; Children under age 77, call Graduate Pediatric Dentistry at [phone]. Children aged 64-14, please call [phone] to request a pediatric application.  Dental services are provided in all areas of dental care including fillings, crowns and bridges, complete and partial dentures, implants, gum treatment, root canals, and extractions. Preventive care is also provided. Treatment is provided to both adults and children. Patients are selected via a lottery and there is often a waiting list.   [facility] [address] [place]  [phone] [email or web address]   [facility] [address] [place], [place] [phone], Ext. [phone] Second and Fourth Thursday of each month, opens at 6:30 AM; Clinic ends at 9 AM.  Patients are seen on a first-come first-served basis, and a limited number are seen during each clinic.   [facility]  [address] [place], [place] [phone]   Eligibility Requirements You must have lived in [place], [place], or [place]  counties for at least the last three months.   You cannot be eligible for state or federal sponsored [place], including [employer], [place], or [employer].   You generally cannot be eligible for healthcare insurance through your employer.    How to apply: Eligibility screenings are held every Tuesday and Wednesday afternoon from 1:00 pm until 4:00 pm. You do not need an appointment for the interview!  [facility] [address] [place], [place] [phone]   [facility] Health Department  [phone]   [facility] Health Department  [phone]  [facility]  [phone]    Behavioral Health Resources in the Community: Intensive Outpatient Programs Organization         Address  Phone  Notes  [facility] [address], [place], [place] [phone]   [facility] [address], [place], [place] [phone]   [facility] [address], [place], [place]  [phone]   [facility] [address],  [place], [place] [phone] or [phone]   Substance Abuse Resources Organization         Address  Phone  Notes  [facility]  [phone]   [facility]  [phone]   [facility]  [phone]   [facility]  [phone]   Residential & Outpatient Substance Abuse Program  [phone]   Psychological Services Organization         Address  Phone  Notes  [facility]  [phone]- [phone]   [facility]  [phone]- [phone]   [facility] [address], [place] [phone] or [phone]    Mobile Crisis Teams Organization         Address  Phone  Notes  [facility], Mobile Crisis Care Unit  [phone]   [facility]  [address] [place], [place] [phone]   Doristine LocksSharon DeEsch [address] [place] [phone]    Self-Help/Support Groups Organization         Address  Phone             Notes  [facility] [place] - variety of support groups  336- [phone] Call for more information  Narcotics Anonymous (NA), [facility] [address], [place]  2 meetings at this location   Statisticianesidential Treatment Programs Organization         Address  Phone  Notes  [facility] [address],    [place]  [phone]   [facility]  [address], [place] [id number], [place], [place] [phone]   [facility] [address], [place] [phone] Admissions: 8am-3pm M-F  [facility] [address]    [place], [place] [phone]   [facility] [address], [place], [place] [phone]   [facility] [address]  [place], [place] [phone]   [facility] - Intensive Outpatient [address], [place], [place] [phone]   [facility] ([facility] [address]  [place], [place] [phone] or [phone]   [facility] ([facility]) [address] [place], [place] [phone] Accepts Medicaid  [facility] [address]  [place] [phone] Substance Abuse/Addiction Treatment   [facility] Resources Organization         Address  Phone  Notes  [facility]  [phone]) [phone]   Angie FavaJulie Brannon, PhD [address], [address] [place], [place]   [phone]) [phone] or [phone]   [facility]   [address] [place], [place] [phone](336) [phone]   [facility] [address], [place], [place] [phone]) [phone] Insurance/Medicaid/sponsorship through [facility]  [facility] [address]                                    [place], [place] [phone]) [phone] Therapy/tele-psych/case  [facility] [address]   [place], [place] [phone]) [phone]    Dr. Lolly MustacheArfeen  [phone](336)  [phone]   [facility]  [employer] [facility] 1) [address], [place] 2) [address], [place] 3)  [address], [place] [phone](336) [phone](336) [phone]  [phone]) [phone]   [facility] Child Abuse Hotline [phone](336) 4242478392 or [phone](336) [phone] (After Hours)

## 2015-11-15 ENCOUNTER — Encounter (HOSPITAL_COMMUNITY): Payer: Self-pay | Admitting: Emergency Medicine

## 2015-11-15 DIAGNOSIS — F17210 Nicotine dependence, cigarettes, uncomplicated: Secondary | ICD-10-CM | POA: Insufficient documentation

## 2015-11-15 DIAGNOSIS — R51 Headache: Secondary | ICD-10-CM | POA: Insufficient documentation

## 2015-11-15 DIAGNOSIS — R630 Anorexia: Secondary | ICD-10-CM | POA: Insufficient documentation

## 2015-11-15 DIAGNOSIS — M791 Myalgia: Secondary | ICD-10-CM | POA: Insufficient documentation

## 2015-11-15 LAB — CBC WITH DIFFERENTIAL/PLATELET
BASOS PCT: 1 %
Basophils Absolute: 0 10*3/uL (ref 0.0–0.1)
Eosinophils Absolute: 0.1 10*3/uL (ref 0.0–0.7)
Eosinophils Relative: 3 %
HEMATOCRIT: 41.9 % (ref 39.0–52.0)
HEMOGLOBIN: 13.6 g/dL (ref 13.0–17.0)
Lymphocytes Relative: 34 %
Lymphs Abs: 1.5 10*3/uL (ref 0.7–4.0)
MCH: 26.9 pg (ref 26.0–34.0)
MCHC: 32.5 g/dL (ref 30.0–36.0)
MCV: 82.8 fL (ref 78.0–100.0)
Monocytes Absolute: 0.3 10*3/uL (ref 0.1–1.0)
Monocytes Relative: 6 %
NEUTROS ABS: 2.4 10*3/uL (ref 1.7–7.7)
NEUTROS PCT: 56 %
Platelets: 241 10*3/uL (ref 150–400)
RBC: 5.06 MIL/uL (ref 4.22–5.81)
RDW: 14.7 % (ref 11.5–15.5)
WBC: 4.2 10*3/uL (ref 4.0–10.5)

## 2015-11-15 LAB — URINALYSIS, ROUTINE W REFLEX MICROSCOPIC
BILIRUBIN URINE: NEGATIVE
Glucose, UA: NEGATIVE mg/dL
Hgb urine dipstick: NEGATIVE
Ketones, ur: NEGATIVE mg/dL
Leukocytes, UA: NEGATIVE
Nitrite: NEGATIVE
PH: 7 (ref 5.0–8.0)
Protein, ur: NEGATIVE mg/dL
SPECIFIC GRAVITY, URINE: 1.022 (ref 1.005–1.030)

## 2015-11-15 LAB — COMPREHENSIVE METABOLIC PANEL
ALBUMIN: 4.1 g/dL (ref 3.5–5.0)
ALT: 22 U/L (ref 17–63)
AST: 26 U/L (ref 15–41)
Alkaline Phosphatase: 67 U/L (ref 38–126)
Anion gap: 9 (ref 5–15)
BUN: 13 mg/dL (ref 6–20)
CO2: 26 mmol/L (ref 22–32)
CREATININE: 1.06 mg/dL (ref 0.61–1.24)
Calcium: 9.1 mg/dL (ref 8.9–10.3)
Chloride: 105 mmol/L (ref 101–111)
GFR calc Af Amer: >60 mL/min (ref >60)
GFR calc non Af Amer: >60 mL/min (ref >60)
GLUCOSE: 96 mg/dL (ref 65–99)
Potassium: 3.9 mmol/L (ref 3.5–5.1)
Sodium: 140 mmol/L (ref 135–145)
Total Bilirubin: 0.6 mg/dL (ref 0.3–1.2)
Total Protein: 7.2 g/dL (ref 6.5–8.1)

## 2015-11-15 NOTE — ED Notes (Signed)
Pt. reports intermittent low grade fever with fatigue " feels tired" , anorexia and mild dizziness for 2 weeks , denies emesis or diarrhea ,  no SOB ,  no cough or congestion .

## 2015-11-16 ENCOUNTER — Emergency Department (HOSPITAL_COMMUNITY)
Admission: EM | Admit: 2015-11-16 | Discharge: 2015-11-16 | Disposition: A | Discharge status: Home / Self Care | Payer: Self-pay | Attending: Emergency Medicine | Admitting: Emergency Medicine

## 2015-11-16 DIAGNOSIS — M791 Myalgia, unspecified site: Secondary | ICD-10-CM

## 2015-11-16 DIAGNOSIS — R630 Anorexia: Secondary | ICD-10-CM

## 2015-11-16 MED ORDER — CYPROHEPTADINE HCL 4 MG PO TABS: 4.0000 mg | ORAL_TABLET | Freq: Three times a day (TID) | ORAL | Status: DC | PRN

## 2015-11-16 NOTE — Discharge Instructions (Signed)
Take acetaminophen (Tylenol) or ibuprofen (Advil, Motrin) as needed for aching.  Cyproheptadine tablets What is this medicine? CYPROHEPTADINE (si proe HEP ta deen) is a antihistamine. This medicine is used to treat allergy symptoms. It is can help stop runny nose, watery eyes, and itchy rash. This medicine may be used for other purposes; ask your health care provider or pharmacist if you have questions. What should I tell my health care provider before I take this medicine? They need to know if you have any of these conditions: -any chronic disease -glaucoma -prostate disease -ulcers or other stomach problems -an unusual or allergic reaction to cyproheptadine, other medicines foods, dyes, or preservatives -pregnant or trying to get pregnant -breast-feeding How should I use this medicine? Take this medicine by mouth with a glass of water. Follow the directions on the prescription label. Take your doses at regular intervals. Do not take your medicine more often than directed. Talk to your pediatrician regarding the use of this medicine in children. While this drug may be prescribed for children as young as 542 years of age for selected conditions, precautions do apply. Overdosage: If you think you have taken too much of this medicine contact a poison control center or emergency room at once. NOTE: This medicine is only for you. Do not share this medicine with others. What if I miss a dose? If you miss a dose, take it as soon as you can. If it is almost time for your next dose, take only that dose. Do not take double or extra doses. What may interact with this medicine? Do not take this medicine with any of the following medications: -MAOIs like Carbex, Eldepryl, Marplan, Nardil, and Parnate This medicine may also interact with the following medications: -alcohol -barbiturate medicines for inducing sleep or treating seizures -medicines for depression, anxiety or psychotic  disturbances -medicines for movement abnormalities -medicines for sleep -medicines for stomach problems -some medicines for cold or allergies This list may not describe all possible interactions. Give your health care provider a list of all the medicines, herbs, non-prescription drugs, or dietary supplements you use. Also tell them if you smoke, drink alcohol, or use illegal drugs. Some items may interact with your medicine. What should I watch for while using this medicine? Visit your doctor or health care professional for regular check ups. Tell your doctor if your symptoms do not improve or if they get worse. You may get drowsy or dizzy. Do not drive, use machinery, or do anything that needs mental alertness until you know how this medicine affects you. Do not stand or sit up quickly, especially if you are an older patient. This reduces the risk of dizzy or fainting spells. Alcohol may interfere with the effect of this medicine. Avoid alcoholic drinks. Your mouth may get dry. Chewing sugarless gum or sucking hard candy, and drinking plenty of water may help. Contact your doctor if the problem does not go away or is severe. This medicine may cause dry eyes and blurred vision. If you wear contact lenses you may feel some discomfort. Lubricating drops may help. See your eye doctor if the problem does not go away or is severe. This medicine can make you more sensitive to the sun. Keep out of the sun. If you cannot avoid being in the sun, wear protective clothing and use sunscreen. Do not use sun lamps or tanning beds/booths. What side effects may I notice from receiving this medicine? Side effects that you should report to your doctor  or health care professional as soon as possible: -allergic reactions like skin rash, itching or hives, swelling of the face, lips, or tongue -agitation, nervousness, excitability, not able to sleep -chest pain -irregular, fast heartbeat -pain or difficulty passing  urine -seizures -unusual bleeding or bruising -unusually weak or tired -yellowing of the eyes or skin Side effects that usually do not require medical attention (report to your doctor or health care professional if they continue or are bothersome): -constipation or diarrhea -headache -loss of appetite -nausea, vomiting -stomach upset -weight gain This list may not describe all possible side effects. Call your doctor for medical advice about side effects. You may report side effects to FDA at 1-800-FDA-1088. Where should I keep my medicine? Keep out of the reach of children. Store at room temperature between 15 and 30 degrees C (59 and 86 degrees F). Keep container tightly closed. Throw away any unused medicine after the expiration date. NOTE: This sheet is a summary. It may not cover all possible information. If you have questions about this medicine, talk to your doctor, pharmacist, or health care provider.    2016, Elsevier/Gold Standard. (2008-02-12 16:29:53)   Emergency Department Resource Guide 1) Find a Doctor and Pay Out of Pocket Although you won't have to find out who is covered by your insurance plan, it is a good idea to ask around and get recommendations. You will then need to call the office and see if the doctor you have chosen will accept you as a new patient and what types of options they offer for patients who are self-pay. Some doctors offer discounts or will set up payment plans for their patients who do not have insurance, but you will need to ask so you aren't surprised when you get to your appointment.  2) Contact Your Local Health Department Not all health departments have doctors that can see patients for sick visits, but many do, so it is worth a call to see if yours does. If you don't know where your local health department is, you can check in your phone book. The CDC also has a tool to help you locate your state's health department, and many state websites also  have listings of all of their local health departments.  3) Find a Walk-in Clinic If your illness is not likely to be very severe or complicated, you may want to try a walk in clinic. These are popping up all over the country in pharmacies, drugstores, and shopping centers. They're usually staffed by nurse practitioners or physician assistants that have been trained to treat common illnesses and complaints. They're usually fairly quick and inexpensive. However, if you have serious medical issues or chronic medical problems, these are probably not your best option.  No Primary Care Doctor: - Call Health Connect at  [phone] - they can help you locate a primary care doctor that  accepts your insurance, provides certain services, etc. - Physician Referral Service- [phone]  Chronic Pain Problems: Organization         Address  Phone   Notes  [facility]  [phone] Patients need to be referred by their primary care doctor.   Medication Assistance: Organization         Address  Phone   Notes  [facility] Medication [facility] [address] [place] [place], [place] [address] [phone] --Must be a resident of [facility] -- Must have NO insurance coverage whatsoever (no Medicaid/ Medicare, etc.) -- The  pt. MUST have a primary care doctor that directs their care regularly and follows them in the community   [facility]  [phone]   [employer]  [phone]    Agencies that provide inexpensive medical care: Organization         Address  Phone   Notes  [facility]  [phone]   [facility]    [phone]   [facility] [address] [place], [place] [address] [phone]   [facility] [address] [place] [place] [phone]   Planned Parenthood    [phone]   [facility]    [phone]   [facility]  [address], [place] Phone:  [phone], Fax:  [phone] Hours of Operation:  9 am - 6 pm, M-F.  Also accepts Medicaid/Medicare and self-pay.  [facility]  [address], [place] Phone: [phone], Fax: [phone]. Hours of Operation:  8:30 am - 5:30 pm, M-F.  Also accepts Medicaid and self-pay.  [facility] [place] [address] [place] Phone: [phone]   [facility] [address] Natasha Bence [place], [place] [phone], Ext. [phone] Mondays & Thursdays: 7-9 AM.  First 15 patients are seen on a first come, first serve basis.    Medicaid-accepting [facility] Providers:  Organization         Address  Phone   Notes  [facility] [place] [address], [place] [phone] Also accepts self-pay patients.  [facility] [address] Laurell Josephs [place], [place]  [phone]   [facility] [address], [place] [phone]   [facility] [address] [place] [phone]   Renaye Rakers [address], [place]   [phone] Only accepts [place] Access [place] patients after they have their name applied to their card.   Self-Pay (no insurance) in [facility]:  Organization         Address  Phone   Notes  Sickle Cell Patients, [facility] [place] [facility] [address] [place], [place] [phone]   [facility] [address] [place], [place] [phone]   [facility] [place]  [address], [place] [phone]   [facility]/Dr. Osei-Bonsu  [address] [place] or [address], [place] [phone] Phone number for both [place] and [place] locations is the same.  Urgent Medical and [facility] [address], [place] [phone]   [facility] [address],  [place] or [address] [phone]   [facility] [address] [place] [phone], phone; [phone], fax Sees patients 1st and 3rd Saturday of every month.  Must not qualify for public or private insurance (i.e. Medicaid, Medicare, [place] Health Choice, Veterans' Benefits)  Household income should be no more than 200% of the poverty level The clinic cannot treat you if you are pregnant or think you are pregnant  Sexually transmitted diseases are not treated at the clinic.    Dental Care: Organization         Address  Phone  Notes  [facility] Department of Public [facility] [address]  Joellyn Quails, [place] [phone] Accepts children up to age 82 who are enrolled in Medicaid or [place] Health Choice; pregnant women with a Medicaid card; and children who have applied for Medicaid or [place] Health Choice, but were declined, whose parents can pay a reduced fee at time of service.  [facility] Department of [facility] [place]  [address], [place] [phone] Accepts children up to age 87 who are enrolled in [place] or [place] Health Choice; pregnant women with a Medicaid card; and children who have applied for Medicaid or [place] Health Choice, but were declined, whose parents can pay a reduced fee at time of service.  [facility]  [address] [place], [place] [phone] Patients are seen by appointment only. Walk-ins are not accepted. [facility] will see patients 89 years of age and older. Monday - Tuesday (8am-5pm) Most Wednesdays (8:30-5pm) $30 per visit, cash only  [facility]  [address], [facility] [phone] Patients are seen by appointment only. Walk-ins are not accepted. [facility] will see patients 19 years of age and older. One Wednesday Evening (Monthly: Volunteer Based).  $30 per visit, cash only  [employer]  [phone] for adults; Children under age 95, call Graduate Pediatric Dentistry at [phone]. Children aged 26-14, please call [phone] to request a pediatric application.  Dental services are provided in all areas of dental care including fillings, crowns and bridges, complete and partial dentures, implants, gum treatment, root canals, and extractions. Preventive care is also provided. Treatment is provided to both adults and children. Patients are selected via a lottery and there is often a waiting list.   [facility] [address] [place]  [phone] [email or web address]   [facility] [address] [place], [place] [phone], Ext. [phone] Second and Fourth Thursday of each month, opens at 6:30 AM; Clinic ends at 9 AM.  Patients are seen on a first-come first-served basis, and a limited number are seen during each clinic.   [facility]  [address] Ether Griffins [place], [place] [phone]   Eligibility Requirements You must have lived in [place], [place], or [place] counties for at least the last three months.   You cannot be eligible for state or federal sponsored [place], including [employer], [place], or [employer].   You generally cannot be eligible for healthcare insurance through your employer.    How to apply: Eligibility screenings are held every Tuesday and Wednesday afternoon from 1:00 pm until 4:00 pm. You do not need an appointment for the interview!  [facility] [address] [place], [place] [phone]   [facility] Health Department  [phone]   [facility] Health Department  [phone]   [facility] Health Department  [phone]    Behavioral Health Resources in the Community: Intensive Outpatient Programs Organization         Address  Phone  Notes  [facility] [address], [place], [place] [phone]    [facility] Outpatient [address], [place], [place] [phone]   [facility] [address] [place], [place]  [phone]   [facility] [address],  [place], [place] [phone] or [phone]   Substance Abuse Resources Organization         Address  Phone  Notes  Alcohol and  Drug Services  [phone]   [facility]  [phone]   [facility] [place]  [phone]   Floydene Flock  [phone]   Residential & Outpatient Substance Abuse Program  [phone]   Psychological Services Organization         Address  Phone  Notes  [facility] Behavioral Health  [phone]   [facility] Services  [phone]   [facility] Health [address], [place] [phone] or [phone]    Mobile Crisis Teams Organization         Address  Phone  Notes  [facility], Mobile Crisis Care Unit  [phone]   [facility]  [address] [place], [place] [phone]   Doristine Locks [address] [place] [phone]    Self-Help/Support Groups Organization         Address  Phone             Notes  [facility] [place] - variety of support groups  336- I7437963 Call for more information  Narcotics Anonymous (NA), [facility] [address], [employer] [place]  2 meetings at this location   Statistician         Address  Phone  Notes  [facility] [address],    [place]  [phone]   [facility]  [address] [place] [address], [place], [place] [phone]   [facility] [address] [place], [place] [phone] Admissions: 8am-3pm M-F  [facility] [address],    [place], [place] [phone]   [facility] [address] [place], [place], [place] [phone]   [facility] [address]  [place],  [place] [phone]   [facility] - Intensive Outpatient [address] Laurell Josephs [address], [place], [place] [phone]   [facility] (Addiction [facility] [address] [place].,  [place], [place] [phone] or [phone]   [facility] ([facility]) [address] [place], [place] [phone] Accepts Medicaid  [facility] [address],  [place] [phone] Substance Abuse/Addiction Treatment   [facility] Organization         Address  Phone  Notes  [facility]  [phone]   Angie Fava, PhD [address] Ervin Knack [place], [place]   [phone] or [phone]   [facility]al   [address] [place], [place] [phone]   [facility] [address], [place], [place] [phone] Insurance/Medicaid/sponsorship through [facility] and Families [address]                                    [place], [place] [phone] Therapy/tele-psych/case  [facility] [address], [place] [phone]    Dr. Lolly Mustache  [phone]   [facility] [place]  [employer] [facility] Dept. 1) [address] [place] 2) [address] [place] 3)  [address], [place] [phone]  [phone]   [facility] Child Abuse Hotline 9201771713 or [phone] (After Hours)

## 2015-11-16 NOTE — ED Notes (Signed)
Found going through cabinet in room.  Left at this time.

## 2015-11-16 NOTE — ED Provider Notes (Signed)
CSN: [id number]     Arrival date & time 11/15/15  2102 History  By signing my name below, I, Colton Lee, attest that this documentation has been prepared under the direction and in the presence of Dione Boozeavid [employer], MD. Electronically Signed: Evon Slackerrance Lee, ED Scribe. 11/16/2015. 2:53 AM.      Chief Complaint  Patient presents with  . Fever  . Fatigue   Patient is a 49 y.o. male presenting with fever. The history is provided by the patient. No language interpreter was used.  Fever Associated symptoms: headaches and myalgias   Associated symptoms: no diarrhea, no sore throat and no vomiting    HPI Comments: Colton Lee is a 49 y.o. male who presents to the Emergency Department complaining of intermittent subjective fever onset 3 weeks. Pt also reports generalized myalgias, decreased appetite and HA for the past 3 weeks. Pt doesn't report any mediations PTA. Pt doesn't report an alleviating or worsening factors. Pt denies sore throat, vomiting or diarrhea. Pt does report that he is an everyday smoker.     Past Medical History  Diagnosis Date  . Foot pain    Past Surgical History  Procedure Laterality Date  . Hernia repair     No family history on file. Social History  Substance Use Topics  . Smoking status: Current Some Day Smoker    Types: Cigars  . Smokeless tobacco: None  . Alcohol Use: Yes    Review of Systems  Constitutional: Positive for fever and appetite change.  HENT: Negative for sore throat.   Gastrointestinal: Negative for vomiting and diarrhea.  Musculoskeletal: Positive for myalgias.  Neurological: Positive for headaches.  All other systems reviewed and are negative.     Allergies  Aspirin and Tetanus toxoids  Home Medications   Prior to Admission medications   Medication Sig Start Date End Date Taking? Authorizing Provider  clotrimazole (LOTRIMIN) 1 % cream Apply to affected area 2 times daily Patient not taking: Reported on 01/25/2015  01/20/15   Elpidio AnisShari Upstill, PA-C  HYDROcodone-acetaminophen (NORCO/VICODIN) 5-325 MG per tablet Take 1 tablet by mouth every 6 (six) hours as needed for moderate pain or severe pain. Patient not taking: Reported on 01/20/2015 05/08/14   Marissa Sciacca, PA-C  naproxen sodium (ANAPROX) 220 MG tablet Take 440-660 mg by mouth 2 (two) times daily as needed (pain).     Historical Provider, MD  traMADol (ULTRAM) 50 MG tablet Take 1 tablet (50 mg total) by mouth every 6 (six) hours as needed. 02/03/15   Antony MaduraKelly Humes, PA-C   BP 122/105 mmHg  Pulse 87  Temp(Src) 98.3 F (36.8 C) (Oral)  Resp 18  Ht 6\' 5"  (1.956 m)  Wt 250 lb (113.399 kg)  BMI 29.64 kg/m2  SpO2 99%    Physical Exam  Constitutional: He is oriented to person, place, and time. He appears well-developed and well-nourished. No distress.  HENT:  Head: Normocephalic and atraumatic.  Eyes: Conjunctivae and EOM are normal. Pupils are equal, round, and reactive to light.  Neck: Normal range of motion. Neck supple. No JVD present.  Cardiovascular: Normal rate, regular rhythm and normal heart sounds.   No murmur heard. Pulmonary/Chest: Effort normal and breath sounds normal. He has no wheezes. He has no rales. He exhibits no tenderness.  Abdominal: Soft. Bowel sounds are normal. He exhibits no distension and no mass. There is no tenderness.  Musculoskeletal: Normal range of motion. He exhibits no edema.  Lymphadenopathy:    He has no cervical adenopathy.  Neurological: He is alert and oriented to person, place, and time. No cranial nerve deficit. He exhibits normal muscle tone. Coordination normal.  Skin: Skin is warm and dry. No rash noted.  Psychiatric: He has a normal mood and affect. His behavior is normal. Judgment and thought content normal.  Nursing note and vitals reviewed.   ED Course  Procedures (including critical care time) DIAGNOSTIC STUDIES: Oxygen Saturation is 99% on RA, normal by my interpretation.    COORDINATION OF  CARE: 2:59 AM-Discussed treatment plan with pt at bedside and pt agreed to plan.     Labs Review Results for orders placed or performed during the hospital encounter of 11/16/15  CBC with Differential  Result Value Ref Range   WBC 4.2 4.0 - 10.5 K/uL   RBC 5.06 4.22 - 5.81 MIL/uL   Hemoglobin 13.6 13.0 - 17.0 g/dL   HCT 16.1 09.6 - 04.5 %   MCV 82.8 78.0 - 100.0 fL   MCH 26.9 26.0 - 34.0 pg   MCHC 32.5 30.0 - 36.0 g/dL   RDW 40.9 81.1 - 91.4 %   Platelets 241 150 - 400 K/uL   Neutrophils Relative % 56 %   Neutro Abs 2.4 1.7 - 7.7 K/uL   Lymphocytes Relative 34 %   Lymphs Abs 1.5 0.7 - 4.0 K/uL   Monocytes Relative 6 %   Monocytes Absolute 0.3 0.1 - 1.0 K/uL   Eosinophils Relative 3 %   Eosinophils Absolute 0.1 0.0 - 0.7 K/uL   Basophils Relative 1 %   Basophils Absolute 0.0 0.0 - 0.1 K/uL  Comprehensive metabolic panel  Result Value Ref Range   Sodium 140 135 - 145 mmol/L   Potassium 3.9 3.5 - 5.1 mmol/L   Chloride 105 101 - 111 mmol/L   CO2 26 22 - 32 mmol/L   Glucose, Bld 96 65 - 99 mg/dL   BUN 13 6 - 20 mg/dL   Creatinine, Ser 7.82 0.61 - 1.24 mg/dL   Calcium 9.1 8.9 - 95.6 mg/dL   Total Protein 7.2 6.5 - 8.1 g/dL   Albumin 4.1 3.5 - 5.0 g/dL   AST 26 15 - 41 U/L   ALT 22 17 - 63 U/L   Alkaline Phosphatase 67 38 - 126 U/L   Total Bilirubin 0.6 0.3 - 1.2 mg/dL   GFR calc non Af Amer >60 >60 mL/min   GFR calc Af Amer >60 >60 mL/min   Anion gap 9 5 - 15  Urinalysis, Routine w reflex microscopic (not at [facility])  Result Value Ref Range   Color, Urine YELLOW YELLOW   APPearance CLEAR CLEAR   Specific Gravity, Urine 1.022 1.005 - 1.030   pH 7.0 5.0 - 8.0   Glucose, UA NEGATIVE NEGATIVE mg/dL   Hgb urine dipstick NEGATIVE NEGATIVE   Bilirubin Urine NEGATIVE NEGATIVE   Ketones, ur NEGATIVE NEGATIVE mg/dL   Protein, ur NEGATIVE NEGATIVE mg/dL   Nitrite NEGATIVE NEGATIVE   Leukocytes, UA NEGATIVE NEGATIVE    MDM   Final diagnoses:  Myalgia  Anorexia       Patient with multiple complaints. However, exam is normal and laboratory workup is normal. Myalgias and decreased appetite may be related to viral illness. Patient is advised of these findings and is advised that outpatient workup would be appropriate. He is given a prescription for Cipro have to [facility] to see if it would help his appetite. He is encouraged to drink plenty of fluids and use over-the-counter analgesics as needed.  I personally performed the services described in this documentation, which was scribed in my presence. The recorded information has been reviewed and is accurate.       Dione Booze, MD 11/16/15 [phone]

## 2015-11-20 NOTE — ED Notes (Signed)
Pt observed by this RN going through the cabinets. Pt observed holding incontinence cleaner. This RN requested that the pt stop going through the cabinets, put the cleaner back, and to exit the room at this time since he has been discharged. Pt became defensive and appeared to be in an aggressive stance pt stated "is this your house?" The writer asked for clarity. Pt repeated himself, "Is this your house? Do you live here?" The writer informed the pt that this was her job. Pt then turned to the writer and stated "I have a bill, I paid for this." Pt continued to display signs of aggression. Pt informed to stay in the room.This RN left the room due to fear of her personal safety.This RN told the Diplomatic Services operational officersecretary to page security. Security was paged. Pt exited the room and left the incontinence cleaner in the room prior to security arrival.

## 2016-01-30 ENCOUNTER — Emergency Department (HOSPITAL_COMMUNITY)
Admission: EM | Admit: 2016-01-30 | Discharge: 2016-02-02 | Disposition: A | Discharge status: Home / Self Care | Payer: Self-pay | Attending: Emergency Medicine | Admitting: Emergency Medicine

## 2016-01-30 ENCOUNTER — Encounter (HOSPITAL_COMMUNITY): Payer: Self-pay | Admitting: Oncology

## 2016-01-30 DIAGNOSIS — F22 Delusional disorders: Secondary | ICD-10-CM | POA: Diagnosis present

## 2016-01-30 DIAGNOSIS — F4325 Adjustment disorder with mixed disturbance of emotions and conduct: Secondary | ICD-10-CM | POA: Diagnosis present

## 2016-01-30 DIAGNOSIS — M79646 Pain in unspecified finger(s): Secondary | ICD-10-CM | POA: Insufficient documentation

## 2016-01-30 DIAGNOSIS — F17210 Nicotine dependence, cigarettes, uncomplicated: Secondary | ICD-10-CM | POA: Insufficient documentation

## 2016-01-30 DIAGNOSIS — F29 Unspecified psychosis not due to a substance or known physiological condition: Secondary | ICD-10-CM | POA: Diagnosis present

## 2016-01-30 NOTE — ED Notes (Signed)
Per EMS pt was bitten spider x 2 months.  Pt is laughing at inappropriate times.  States that he does not know where spider bit him, does have wound to right pointer finger.  ?Medical clearance.

## 2016-01-31 ENCOUNTER — Encounter (HOSPITAL_COMMUNITY): Payer: Self-pay | Admitting: Registered Nurse

## 2016-01-31 ENCOUNTER — Emergency Department (HOSPITAL_COMMUNITY): Payer: Self-pay

## 2016-01-31 DIAGNOSIS — F29 Unspecified psychosis not due to a substance or known physiological condition: Secondary | ICD-10-CM | POA: Diagnosis present

## 2016-01-31 LAB — CBC WITH DIFFERENTIAL/PLATELET
BASOS ABS: 0 10*3/uL (ref 0.0–0.1)
BASOS PCT: 0 %
Eosinophils Absolute: 0.1 10*3/uL (ref 0.0–0.7)
Eosinophils Relative: 1 %
HEMATOCRIT: 40 % (ref 39.0–52.0)
HEMOGLOBIN: 12.7 g/dL — AB (ref 13.0–17.0)
Lymphocytes Relative: 18 %
Lymphs Abs: 1.4 10*3/uL (ref 0.7–4.0)
MCH: 26.2 pg (ref 26.0–34.0)
MCHC: 31.8 g/dL (ref 30.0–36.0)
MCV: 82.6 fL (ref 78.0–100.0)
Monocytes Absolute: 0.6 10*3/uL (ref 0.1–1.0)
Monocytes Relative: 8 %
NEUTROS ABS: 5.5 10*3/uL (ref 1.7–7.7)
NEUTROS PCT: 73 %
Platelets: 204 10*3/uL (ref 150–400)
RBC: 4.84 MIL/uL (ref 4.22–5.81)
RDW: 14.6 % (ref 11.5–15.5)
WBC: 7.6 10*3/uL (ref 4.0–10.5)

## 2016-01-31 LAB — COMPREHENSIVE METABOLIC PANEL
ALK PHOS: 53 U/L (ref 38–126)
ALT: 24 U/L (ref 17–63)
AST: 28 U/L (ref 15–41)
Albumin: 4.4 g/dL (ref 3.5–5.0)
Anion gap: 11 (ref 5–15)
BUN: 10 mg/dL (ref 6–20)
CHLORIDE: 102 mmol/L (ref 101–111)
CO2: 26 mmol/L (ref 22–32)
Calcium: 9.3 mg/dL (ref 8.9–10.3)
Creatinine, Ser: 0.99 mg/dL (ref 0.61–1.24)
GFR calc non Af Amer: >60 mL/min (ref >60)
GLUCOSE: 107 mg/dL — AB (ref 65–99)
POTASSIUM: 3.9 mmol/L (ref 3.5–5.1)
SODIUM: 139 mmol/L (ref 135–145)
TOTAL PROTEIN: 8.1 g/dL (ref 6.5–8.1)
Total Bilirubin: 0.9 mg/dL (ref 0.3–1.2)

## 2016-01-31 LAB — ETHANOL

## 2016-01-31 LAB — RAPID URINE DRUG SCREEN, HOSP PERFORMED
AMPHETAMINES: NOT DETECTED
BARBITURATES: NOT DETECTED
BENZODIAZEPINES: NOT DETECTED
COCAINE: NOT DETECTED
Opiates: NOT DETECTED
TETRAHYDROCANNABINOL: NOT DETECTED

## 2016-01-31 MED ORDER — ACETAMINOPHEN 325 MG PO TABS: 650.0000 mg | ORAL_TABLET | ORAL | Status: DC | PRN

## 2016-01-31 MED ORDER — ALUM & MAG HYDROXIDE-SIMETH 200-200-20 MG/5ML PO SUSP: 30.0000 mL | ORAL | Status: DC | PRN

## 2016-01-31 MED ORDER — ARIPIPRAZOLE 2 MG PO TABS
2.0000 mg | ORAL_TABLET | Freq: Two times a day (BID) | ORAL | Status: DC
  Administered 2016-01-31 – 2016-02-01 (×2): 2 mg via ORAL
  Filled 2016-01-31 (×4): qty 1

## 2016-01-31 MED ORDER — ZIPRASIDONE MESYLATE 20 MG IM SOLR
20.0000 mg | Freq: Once | INTRAMUSCULAR | Status: AC
  Administered 2016-01-31: 20 mg via INTRAMUSCULAR
  Filled 2016-01-31: qty 20

## 2016-01-31 MED ORDER — ONDANSETRON HCL 4 MG PO TABS
4.0000 mg | ORAL_TABLET | Freq: Three times a day (TID) | ORAL | Status: DC | PRN
Start: 2016-01-31 — End: 2016-02-02

## 2016-01-31 MED ORDER — LORAZEPAM 1 MG PO TABS: 1.0000 mg | ORAL_TABLET | Freq: Three times a day (TID) | ORAL | Status: DC | PRN

## 2016-01-31 MED ORDER — ZOLPIDEM TARTRATE 5 MG PO TABS: 5.0000 mg | ORAL_TABLET | Freq: Every evening | ORAL | Status: DC | PRN

## 2016-01-31 MED ORDER — STERILE WATER FOR INJECTION IJ SOLN
INTRAMUSCULAR | Status: AC
Start: 2016-01-31 — End: 2016-01-31
  Administered 2016-01-31: 1.2 mL via INTRAMUSCULAR
  Filled 2016-01-31: qty 10

## 2016-01-31 NOTE — ED Notes (Signed)
Pt. Noted sleeping in room. No complaints or concerns voiced. No distress or abnormal behavior noted. Will continue to monitor with security cameras. Q 15 minute rounds continue. 

## 2016-01-31 NOTE — ED Notes (Signed)
Pt. Noted in room. No complaints or concerns voiced. No acute distress noted. Will continue to monitor with security cameras. Q 15 minute rounds continue. 

## 2016-01-31 NOTE — ED Notes (Signed)
Awake. Verbally responsive. Resp even and unlabored. ABC's intact. Pt noted having audible verbal conversation with unforseen person. Pt denies audible/visual hallucinations and SI/HI. Pt ambulated to bathroom with steady gait. NAD noted.

## 2016-01-31 NOTE — ED Notes (Signed)
Pt. To [facility] from ED ambulatory without difficulty, to [facility] 35 . [facility] Pt. Is alert, warm and dry in no distress. Pt. Denies SI, HI, and AVH. Pt. Calm and cooperative. Pt. Made aware of security cameras and Q15 minute rounds. Pt. Encouraged to let Nursing staff know of any concerns or needs.

## 2016-01-31 NOTE — BH Assessment (Signed)
Assessment completed. AM psych eval is recommended.

## 2016-01-31 NOTE — BH Assessment (Addendum)
Tele Assessment Note   Colton Lee is an 50 y.o. male presenting to [facility] due to an insect bite. Pt stated "I was bitten by something and I am unsure if it is venom or poison". Pt denies SI,HI and AVH at this time; however it is unclear if pt is responding to internal stimuli. Pt did not report any previous suicide attempts or self-injurious behaviors. Pt did not report any mental health treatment or psychiatric hospitalizations. Pt did not report any depressive symptoms but shared that his job is stressful. 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 Pt did not report any illicit substance or alcohol use at this time.  Diagnosis: unspecified mental disorder   Past Medical History:  Past Medical History  Diagnosis Date  . Foot pain     Past Surgical History  Procedure Laterality Date  . Hernia repair      Family History: History reviewed. No pertinent family history.  Social History:  reports that he has been smoking Cigars.  He does not have any smokeless tobacco history on file. He reports that he drinks alcohol. He reports that he does not use illicit drugs.  Additional Social History:  Alcohol / Drug Use History of alcohol / drug use?: No history of alcohol / drug abuse  CIWA: CIWA-Ar BP: 124/71 mmHg Pulse Rate: 94 COWS:    PATIENT STRENGTHS: (choose at least two) Average or above average intelligence Communication skills  Allergies:  Allergies  Allergen Reactions  . Aspirin Itching  . Tetanus Toxoids Hives    Home Medications:  (Not in a hospital admission)  OB/GYN Status:  No LMP for male patient.  General Assessment Data Location of Assessment: [facility] ED TTS Assessment: In system Is this a Tele or Face-to-Face Assessment?: Face-to-Face Is this an Initial Assessment or a Re-assessment for this encounter?: Initial Assessment Marital status: Single Living Arrangements: Alone Can pt return to current living  arrangement?: Yes Admission Status: Voluntary Is patient capable of signing voluntary admission?: Yes Referral Source: Self/Family/Friend Insurance type: None      Crisis Care Plan Living Arrangements: Alone Name of Psychiatrist: No provider reported. Name of Therapist: No provider reported.   Education Status Is patient currently in school?: No Current Grade: N/A Highest grade of school patient has completed: N/A Name of school: N/A Contact person: N/A  Risk to self with the past 6 months Suicidal Ideation: No Has patient been a risk to self within the past 6 months prior to admission? : No Suicidal Intent: No Has patient had any suicidal intent within the past 6 months prior to admission? : No Is patient at risk for suicide?: No Suicidal Plan?: No Has patient had any suicidal plan within the past 6 months prior to admission? : No Access to Means: No What has been your use of drugs/alcohol within the last 12 months?: Pt denies abuse.  Previous Attempts/Gestures: No How many times?: 0 Other Self Harm Risks: Pt denies  Triggers for Past Attempts: None known (No attempts reported, ) Intentional Self Injurious Behavior: None Family Suicide History: No Recent stressful life event(s): Other (Comment) (work related stressors ) Persecutory voices/beliefs?: No Depression: No Depression Symptoms:  (Pt denies ) Substance abuse history and/or treatment for substance abuse?: No Suicide prevention information given to non-admitted patients: Not applicable  Risk to Others within the past 6 months Homicidal Ideation: No Does patient have any lifetime risk of violence toward others beyond the six  months prior to admission? : No Thoughts of Harm to Others: No Current Homicidal Intent: No Current Homicidal Plan: No Access to Homicidal Means: No Identified Victim: N/A History of harm to others?: No Assessment of Violence: None Noted Violent Behavior Description: No violent behaviors  observed. Pt is calm and cooperative.  Does patient have access to weapons?: Yes (Comment) ([address]) Criminal Charges Pending?: No Does patient have a court date: No Is patient on probation?: No  Psychosis Hallucinations: None noted Delusions: None noted  Mental Status Report Appearance/Hygiene: In scrubs Eye Contact: Fair Motor Activity: Freedom of movement Speech: Logical/coherent Level of Consciousness: Alert Mood: Pleasant Affect: Appropriate to circumstance Anxiety Level: Minimal Thought Processes: Circumstantial Judgement: Unimpaired Orientation: Appropriate for developmental age Obsessive Compulsive Thoughts/Behaviors: Minimal  Cognitive Functioning Concentration: Normal Memory: Recent Intact, Remote Intact IQ: Average Insight: Fair Impulse Control: Good Appetite: Good Weight Loss: 0 Weight Gain: 0 Sleep: No Change Total Hours of Sleep: 8 Vegetative Symptoms: None  ADLScreening [facility] Assessment [facility]) Patient's cognitive ability adequate to safely complete daily activities?: Yes Patient able to express need for assistance with ADLs?: Yes Independently performs ADLs?: Yes (appropriate for developmental age)  Prior Inpatient Therapy Prior Inpatient Therapy: No  Prior Outpatient Therapy Prior Outpatient Therapy: No Does patient have an ACCT team?: No Does patient have Intensive In-House Services?  : No Does patient have [facility] services? : No Does patient have P4CC services?: No  ADL Screening (condition at time of admission) Patient's cognitive ability adequate to safely complete daily activities?: Yes Is the patient deaf or have difficulty hearing?: No Does the patient have difficulty seeing, even when wearing glasses/contacts?: No Does the patient have difficulty concentrating, remembering, or making decisions?: No Patient able to express need for assistance with ADLs?: Yes Does the patient have difficulty dressing or bathing?: No Independently  performs ADLs?: Yes (appropriate for developmental age)       Abuse/Neglect Assessment (Assessment to be complete while patient is alone) Physical Abuse: Denies Verbal Abuse: Denies Sexual Abuse: Denies Exploitation of patient/patient's resources: Denies Self-Neglect: Denies     Merchant navy officerAdvance Directives (For Healthcare) Does patient have an advance directive?: No Would patient like information on creating an advanced directive?: No - patient declined information    Additional Information 1:1 In Past 12 Months?: No CIRT Risk: No Elopement Risk: No Does patient have medical clearance?: Yes     Disposition:  Disposition Initial Assessment Completed for this Encounter: Yes Disposition of Patient: Other dispositions Other disposition(s): Other (Comment) (AM Psych eval )  Christin Mccreedy S 01/31/2016 3:41 AM

## 2016-01-31 NOTE — ED Provider Notes (Signed)
CSN: [id number]     Arrival date & time 01/30/16  2333 History   First MD Initiated Contact with Patient 01/31/16 0139     Chief Complaint  Patient presents with  . Insect Bite     (Consider location/radiation/quality/duration/timing/severity/associated sxs/prior Treatment) Patient is a 50 y.o. male presenting with hand pain. The history is provided by the patient. No language interpreter was used.  Hand Pain This is a recurrent problem. The current episode started more than 1 year ago. The problem occurs constantly. The problem has been unchanged. Nothing aggravates the symptoms. He has tried nothing for the symptoms. The treatment provided no relief.   Pt reports he was bitten by an insect a year ago.  Pt very poor historian.  Ems reports finger Past Medical History  Diagnosis Date  . Foot pain    Past Surgical History  Procedure Laterality Date  . Hernia repair     History reviewed. No pertinent family history. Social History  Substance Use Topics  . Smoking status: Current Some Day Smoker    Types: Cigars  . Smokeless tobacco: None  . Alcohol Use: Yes    Review of Systems  All other systems reviewed and are negative.     Allergies  Aspirin and Tetanus toxoids  Home Medications   Prior to Admission medications   Medication Sig Start Date End Date Taking? Authorizing Provider  cyproheptadine (PERIACTIN) 4 MG tablet Take 1 tablet (4 mg total) by mouth 3 (three) times daily as needed (appetite). 11/16/15   Dione Booze, MD  naproxen sodium (ANAPROX) 220 MG tablet Take 440-660 mg by mouth 2 (two) times daily as needed (pain).     Historical Provider, MD   BP 124/71 mmHg  Pulse 94  Temp(Src) 98.6 F (37 C) (Oral)  Resp 22  Ht  (1.956 m)  Wt 117.935 kg  BMI 30.83 kg/m2  SpO2 100% Physical Exam  Constitutional: He is oriented to person, place, and time. He appears well-developed and well-nourished.  HENT:  Head: Normocephalic and atraumatic.  Right Ear:  External ear normal.  Mouth/Throat: Oropharynx is clear and moist.  Eyes: Conjunctivae and EOM are normal. Pupils are equal, round, and reactive to light.  Neck: Normal range of motion.  Cardiovascular: Normal rate and normal heart sounds.   Pulmonary/Chest: Effort normal.  Abdominal: Soft. He exhibits no distension.  Musculoskeletal: Normal range of motion.  Neurological: He is alert and oriented to person, place, and time.  Skin: Skin is warm.  Psychiatric:  (Pt has difficulty answering questions)   Nursing note and vitals reviewed.   ED Course  Procedures (including critical care time) Labs Review Labs Reviewed  COMPREHENSIVE METABOLIC PANEL - Abnormal; Notable for the following:    Glucose, Bld 107 (*)    All other components within normal limits  CBC WITH DIFFERENTIAL/PLATELET - Abnormal; Notable for the following:    Hemoglobin 12.7 (*)    All other components within normal limits  ETHANOL  URINE RAPID DRUG SCREEN, HOSP PERFORMED    Imaging Review No results found. I have personally reviewed and evaluated these images and lab results as part of my medical decision-making.   EKG Interpretation None      MDM  Pt disheveled, has a cord wrapped around his neck tied in knots,   TTS evaluated pt and advised that pt will need to see psychiatrist in am.   Final diagnoses:  Pain of finger, unspecified laterality  Lonia SkinnerLeslie K Wixon ValleySofia, PA-C 01/31/16 [id number]  April Palumbo, MD 01/31/16 [phone]

## 2016-01-31 NOTE — Consult Note (Signed)
[place] Psychiatry Consult   Reason for Consult:  Spider bite, odd behavior Referring Physician:  EDP Patient Identification: Colton Lee MRN:  [id number] Principal Diagnosis: Psychotic disorder Diagnosis:   Patient Active Problem List   Diagnosis Date Noted  . 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 male patient presented to [facility] with complaints of spider bite and odd behavior  HPI:  Colton Lee 50 yr old black male states that he came to the hospital because he had a spider bite and now has swelling in his leg (left leg). Unable to determine if patient is reaction to internal/external stimuli or delusional thinking.  Upon entering the patients room he appeared to be talking to someone but no one in room.  Patient states that he works for the [employer].  Patient is all over the place with conversations.  He presents as disorganized, confused, Irritable and angry.  Patient is not a good historian and some thought blocking.  Patient states that he is angry related to coming to the hospital for a spider bite and not getting the help he needs instead he is repeatedly asked "dumb ass questions."   Past Psychiatric History: No prior psych history, SA, or inpatient/outpatient services.    Risk to Self: Suicidal Ideation: No Suicidal Intent: No Is patient at risk for suicide?: No Suicidal Plan?: No Access to Means: No What has been your use of drugs/alcohol within the last 12 months?: Pt denies abuse.  How many times?: 0 Other Self Harm Risks: Pt denies  Triggers for Past Attempts: None known (No attempts reported, ) Intentional Self Injurious Behavior: None Risk to Others: Homicidal Ideation: No Thoughts of Harm to Others: No Current Homicidal Intent: No Current Homicidal Plan: No Access to Homicidal Means: No Identified Victim: N/A History of harm to others?: No Assessment  of Violence: None Noted Violent Behavior Description: No violent behaviors observed. Pt is calm and cooperative.  Does patient have access to weapons?: Yes (Comment) (magnum 44) Criminal Charges Pending?: No Does patient have a court date: No Prior Inpatient Therapy: Prior Inpatient Therapy: No Prior Outpatient Therapy: Prior Outpatient Therapy: No Does patient have an ACCT team?: No Does patient have Intensive In-House Services?  : No Does patient have [facility] services? : No Does patient have P4CC services?: No  Past Medical History:  Past Medical History  Diagnosis Date  . Foot pain     Past Surgical History  Procedure Laterality Date  . Hernia repair     Family History: History reviewed. No pertinent family history. Family Psychiatric  History: Denies Social History:  History  Alcohol Use  . Yes     History  Drug Use No    Social History   Social History  . Marital Status: Single    Spouse Name: N/A  . Number of Children: N/A  . Years of Education: N/A   Social History Main Topics  . Smoking status: Current Some Day Smoker    Types: Cigars  . Smokeless tobacco: None  . Alcohol Use: Yes  . Drug Use: No  . Sexual Activity: Not Asked   Other Topics Concern  . None   Social History Narrative   Additional Social History:    Allergies:   Allergies  Allergen Reactions  . Aspirin Itching  . Pork-Derived Products Other (See Comments)    Religious purposes   .  Tetanus Toxoids Hives    Labs:  Results for orders placed or performed during the hospital encounter of 01/30/16 (from the past 48 hour(s))  Urine rapid drug screen (hosp performed)not at [facility]     Status: None   Collection Time: 01/31/16  2:22 AM  Result Value Ref Range   Opiates NONE DETECTED NONE DETECTED   Cocaine NONE DETECTED NONE DETECTED   Benzodiazepines NONE DETECTED NONE DETECTED   Amphetamines NONE DETECTED NONE DETECTED   Tetrahydrocannabinol NONE DETECTED NONE DETECTED    Barbiturates NONE DETECTED NONE DETECTED    Comment:        DRUG SCREEN FOR MEDICAL PURPOSES ONLY.  IF CONFIRMATION IS NEEDED FOR ANY PURPOSE, NOTIFY LAB WITHIN 5 DAYS.        LOWEST DETECTABLE LIMITS FOR URINE DRUG SCREEN Drug Class       Cutoff (ng/mL) Amphetamine      1000 Barbiturate      200 Benzodiazepine   096 Tricyclics       283 Opiates          300 Cocaine          300 THC              50   Comprehensive metabolic panel     Status: Abnormal   Collection Time: 01/31/16  2:24 AM  Result Value Ref Range   Sodium 139 135 - 145 mmol/L   Potassium 3.9 3.5 - 5.1 mmol/L   Chloride 102 101 - 111 mmol/L   CO2 26 22 - 32 mmol/L   Glucose, Bld 107 (H) 65 - 99 mg/dL   BUN 10 6 - 20 mg/dL   Creatinine, Ser 0.99 0.61 - 1.24 mg/dL   Calcium 9.3 8.9 - 10.3 mg/dL   Total Protein 8.1 6.5 - 8.1 g/dL   Albumin 4.4 3.5 - 5.0 g/dL   AST 28 15 - 41 U/L   ALT 24 17 - 63 U/L   Alkaline Phosphatase 53 38 - 126 U/L   Total Bilirubin 0.9 0.3 - 1.2 mg/dL   GFR calc non Af Amer >60 >60 mL/min   GFR calc Af Amer >60 >60 mL/min    Comment: (NOTE) The eGFR has been calculated using the CKD EPI equation. This calculation has not been validated in all clinical situations. eGFR's persistently <60 mL/min signify possible Chronic Kidney Disease.    Anion gap 11 5 - 15  Ethanol     Status: None   Collection Time: 01/31/16  2:24 AM  Result Value Ref Range   Alcohol, Ethyl (B) <5 <5 mg/dL    Comment:        LOWEST DETECTABLE LIMIT FOR SERUM ALCOHOL IS 5 mg/dL FOR MEDICAL PURPOSES ONLY   CBC with Diff     Status: Abnormal   Collection Time: 01/31/16  2:24 AM  Result Value Ref Range   WBC 7.6 4.0 - 10.5 K/uL   RBC 4.84 4.22 - 5.81 MIL/uL   Hemoglobin 12.7 (L) 13.0 - 17.0 g/dL   HCT 40.0 39.0 - 52.0 %   MCV 82.6 78.0 - 100.0 fL   MCH 26.2 26.0 - 34.0 pg   MCHC 31.8 30.0 - 36.0 g/dL   RDW 14.6 11.5 - 15.5 %   Platelets 204 150 - 400 K/uL   Neutrophils Relative % 73 %   Neutro Abs 5.5  1.7 - 7.7 K/uL   Lymphocytes Relative 18 %   Lymphs Abs 1.4 0.7 - 4.0 K/uL  Monocytes Relative 8 %   Monocytes Absolute 0.6 0.1 - 1.0 K/uL   Eosinophils Relative 1 %   Eosinophils Absolute 0.1 0.0 - 0.7 K/uL   Basophils Relative 0 %   Basophils Absolute 0.0 0.0 - 0.1 K/uL    Current Facility-Administered Medications  Medication Dose Route Frequency Provider Last Rate Last Dose  . acetaminophen (TYLENOL) tablet 650 mg  650 mg Oral Q4H PRN Fransico Meadow, PA-C      . alum & mag hydroxide-simeth (MAALOX/MYLANTA) 200-200-20 MG/5ML suspension 30 mL  30 mL Oral PRN Fransico Meadow, PA-C      . ARIPiprazole (ABILIFY) tablet 2 mg  2 mg Oral BID Shuvon B Rankin, NP      . ondansetron (ZOFRAN) tablet 4 mg  4 mg Oral Q8H PRN Fransico Meadow, PA-C       Current Outpatient Prescriptions  Medication Sig Dispense Refill  . cyproheptadine (PERIACTIN) 4 MG tablet Take 1 tablet (4 mg total) by mouth 3 (three) times daily as needed (appetite). (Patient not taking: Reported on 01/31/2016) 30 tablet 0    Musculoskeletal: Strength & Muscle Tone: within normal limits Gait & Station: normal Patient leans: N/A  Psychiatric Specialty Exam: Review of Systems  Musculoskeletal:       Edema lower ext   Skin:       Edema left leg Hand bilaterally callused/dry Skin dry   Psychiatric/Behavioral: Positive for hallucinations. Depression: Denies. Suicidal ideas: Denies. Substance abuse: Denies. Nervous/anxious: Denies. Insomnia: Denies.        Some delusional thinking; Works for [employer] or confusion/disoriented     Blood pressure 137/86, pulse 80, temperature 98.4 F (36.9 C), temperature source Oral, resp. rate 20, height _0  (1.956 m), weight 117.935 kg (260 lb), SpO2 100 %.Body mass index is 30.83 kg/(m^2).  General Appearance: Disheveled and Odorous  Eye Contact::  Good  Speech:  Clear and Coherent and Normal Rate  Volume:  Increased  Mood:  Irritable and Confusion  Affect:  Labile   Thought Process:  Disorganized, Irrelevant and confusion  Orientation:  Full (Time, Place, and Person)  Thought Content:  Delusions, Hallucinations: Auditory and Rumination  Suicidal Thoughts:  Denies  Homicidal Thoughts:  Denies  Memory:  Immediate;   Poor Recent;   Poor Remote;   Poor  Judgement:  Impaired  Insight:  Lacking  Psychomotor Activity:  Restlessness  Concentration:  Poor  Recall:  Poor  Fund of Knowledge:Poor  Language: Poor  Akathisia:  No  Handed:  Right  AIMS (if indicated):     Assets:  Desire for Improvement  ADL's:  Intact  Cognition: WNL  Sleep:      Treatment Plan Summary: Daily contact with patient to assess and evaluate symptoms and progress in treatment, Medication management and Plan Inpatient treatment recommended Will start Abilif 2 mg Bid for Mood stabilization; irritability; agression  Disposition: Recommend psychiatric Inpatient admission when medically cleared.  Earleen Newport, NP 01/31/2016 4:58 PM  Patient seen and chart reviewed. Case discussed with APP and treatment team and formulated treatment plan. Reviewed the information documented and agree with the treatment plan.  Brinda Focht,JANARDHAHA R. 02/02/2016 2:19 PM

## 2016-01-31 NOTE — ED Notes (Signed)
Patient transported to CT 

## 2016-02-01 DIAGNOSIS — F22 Delusional disorders: Secondary | ICD-10-CM

## 2016-02-01 MED ORDER — HALOPERIDOL 5 MG PO TABS
10.0000 mg | ORAL_TABLET | Freq: Two times a day (BID) | ORAL | Status: DC
  Administered 2016-02-01 – 2016-02-02 (×3): 10 mg via ORAL
  Filled 2016-02-01 (×3): qty 2

## 2016-02-01 MED ORDER — BENZTROPINE MESYLATE 1 MG PO TABS
1.0000 mg | ORAL_TABLET | Freq: Two times a day (BID) | ORAL | Status: DC
  Administered 2016-02-01 – 2016-02-02 (×3): 1 mg via ORAL
  Filled 2016-02-01 (×3): qty 1

## 2016-02-01 NOTE — ED Notes (Signed)
Pt. Noted sleeping in room. No complaints or concerns voiced. No distress or abnormal behavior noted. Will continue to monitor with security cameras. Q 15 minute rounds continue. 

## 2016-02-01 NOTE — ED Notes (Signed)
Pt appears very labile.  Although he reluctantly takes his medication he is very agitated that no one is telling him when he can leave.  He is disorganized in his speech talking [place] living [place].  He also appears to be responding to internal stimuli.  15 minute checks and video monitoring continue.

## 2016-02-01 NOTE — ED Notes (Signed)
Snack and beverage given. 

## 2016-02-01 NOTE — ED Notes (Signed)
Report received from Edie Marvin RN. Pt. Sleeping, respirations regular and unlabored. Will continue to monitor for safety via security cameras and Q 15 minute checks. 

## 2016-02-01 NOTE — Progress Notes (Signed)
Disposition CSW completed patient referrals to the following inpatient psych facilities:  [facility]  CSW will continue to follow patient for placement needs.  Seward SpeckLeo Trudy Kory Raulerson HospitalCSW,LCAS Behavioral Health Disposition CSW [phone]

## 2016-02-01 NOTE — Consult Note (Signed)
[place] Psychiatry Consult   Reason for Consult:  Psychosis Referring Physician:  EDP Patient Identification: Colton Lee MRN:  [id number] Principal Diagnosis: Delusional disorder [facility]) Diagnosis:   Patient Active Problem List   Diagnosis Date Noted  . Delusional disorder ([place]) [F22] 02/01/2016    Priority: High  . Psychotic disorder [F29] 01/31/2016    Priority: High    Total Time spent with patient: 45 minutes  Subjective:   Colton Lee is a 50 y.o. male patient admitted with delusions, psychosis.  HPI:  50 yo male who presented to the ED with leg pains, history of leg pain.  On assessment, he was initially focused on his leg pains.  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  He kept redirecting the questions to the psychiatric team versus answering the questions.  I have "black skin but not really black, I'm black."  Unsure from his no past psychiatric history and history of feigning, if he is truly psychotic or in need of a place to stay.  Past Psychiatric History: Feigning somatic symptoms in ED  Risk to Self: Suicidal Ideation: No Suicidal Intent: No Is patient at risk for suicide?: No Suicidal Plan?: No Access to Means: No What has been your use of drugs/alcohol within the last 12 months?: Pt denies abuse.  How many times?: 0 Other Self Harm Risks: Pt denies  Triggers for Past Attempts: None known (No attempts reported, ) Intentional Self Injurious Behavior: None Risk to Others: Homicidal Ideation: No Thoughts of Harm to Others: No Current Homicidal Intent: No Current Homicidal Plan: No Access to Homicidal Means: No Identified Victim: N/A History of harm to others?: No Assessment of Violence: None Noted Violent Behavior Description: No violent behaviors observed. Pt is calm and cooperative.  Does patient have access to weapons?: Yes (Comment) (magnum 44) Criminal Charges Pending?:  No Does patient have a court date: No Prior Inpatient Therapy: Prior Inpatient Therapy: No Prior Outpatient Therapy: Prior Outpatient Therapy: No Does patient have an ACCT team?: No Does patient have Intensive In-House Services?  : No Does patient have [facility] services? : No Does patient have P4CC services?: No  Past Medical History:  Past Medical History  Diagnosis Date  . Foot pain     Past Surgical History  Procedure Laterality Date  . Hernia repair     Family History: History reviewed. No pertinent family history. Family Psychiatric  History: NOne Social History:  History  Alcohol Use  . Yes     History  Drug Use No    Social History   Social History  . Marital Status: Single    Spouse Name: N/A  . Number of Children: N/A  . Years of Education: N/A   Social History Main Topics  . Smoking status: Current Some Day Smoker    Types: Cigars  . Smokeless tobacco: None  . Alcohol Use: Yes  . Drug Use: No  . Sexual Activity: Not Asked   Other Topics Concern  . None   Social History Narrative   Additional Social History:    Allergies:   Allergies  Allergen Reactions  . Aspirin Itching  . Pork-Derived Products Other (See Comments)    Religious purposes   . Tetanus Toxoids Hives    Labs:  Results for orders placed or performed during the hospital encounter of 01/30/16 (from the past 48 hour(s))  Urine rapid drug screen (hosp performed)not at [facility]  Status: None   Collection Time: 01/31/16  2:22 AM  Result Value Ref Range   Opiates NONE DETECTED NONE DETECTED   Cocaine NONE DETECTED NONE DETECTED   Benzodiazepines NONE DETECTED NONE DETECTED   Amphetamines NONE DETECTED NONE DETECTED   Tetrahydrocannabinol NONE DETECTED NONE DETECTED   Barbiturates NONE DETECTED NONE DETECTED    Comment:        DRUG SCREEN FOR MEDICAL PURPOSES ONLY.  IF CONFIRMATION IS NEEDED FOR ANY PURPOSE, NOTIFY LAB WITHIN 5 DAYS.        LOWEST DETECTABLE LIMITS FOR URINE  DRUG SCREEN Drug Class       Cutoff (ng/mL) Amphetamine      1000 Barbiturate      200 Benzodiazepine   767 Tricyclics       341 Opiates          300 Cocaine          300 THC              50   Comprehensive metabolic panel     Status: Abnormal   Collection Time: 01/31/16  2:24 AM  Result Value Ref Range   Sodium 139 135 - 145 mmol/L   Potassium 3.9 3.5 - 5.1 mmol/L   Chloride 102 101 - 111 mmol/L   CO2 26 22 - 32 mmol/L   Glucose, Bld 107 (H) 65 - 99 mg/dL   BUN 10 6 - 20 mg/dL   Creatinine, Ser 0.99 0.61 - 1.24 mg/dL   Calcium 9.3 8.9 - 10.3 mg/dL   Total Protein 8.1 6.5 - 8.1 g/dL   Albumin 4.4 3.5 - 5.0 g/dL   AST 28 15 - 41 U/L   ALT 24 17 - 63 U/L   Alkaline Phosphatase 53 38 - 126 U/L   Total Bilirubin 0.9 0.3 - 1.2 mg/dL   GFR calc non Af Amer >60 >60 mL/min   GFR calc Af Amer >60 >60 mL/min    Comment: (NOTE) The eGFR has been calculated using the CKD EPI equation. This calculation has not been validated in all clinical situations. eGFR's persistently <60 mL/min signify possible Chronic Kidney Disease.    Anion gap 11 5 - 15  Ethanol     Status: None   Collection Time: 01/31/16  2:24 AM  Result Value Ref Range   Alcohol, Ethyl (B) <5 <5 mg/dL    Comment:        LOWEST DETECTABLE LIMIT FOR SERUM ALCOHOL IS 5 mg/dL FOR MEDICAL PURPOSES ONLY   CBC with Diff     Status: Abnormal   Collection Time: 01/31/16  2:24 AM  Result Value Ref Range   WBC 7.6 4.0 - 10.5 K/uL   RBC 4.84 4.22 - 5.81 MIL/uL   Hemoglobin 12.7 (L) 13.0 - 17.0 g/dL   HCT 40.0 39.0 - 52.0 %   MCV 82.6 78.0 - 100.0 fL   MCH 26.2 26.0 - 34.0 pg   MCHC 31.8 30.0 - 36.0 g/dL   RDW 14.6 11.5 - 15.5 %   Platelets 204 150 - 400 K/uL   Neutrophils Relative % 73 %   Neutro Abs 5.5 1.7 - 7.7 K/uL   Lymphocytes Relative 18 %   Lymphs Abs 1.4 0.7 - 4.0 K/uL   Monocytes Relative 8 %   Monocytes Absolute 0.6 0.1 - 1.0 K/uL   Eosinophils Relative 1 %   Eosinophils Absolute 0.1 0.0 - 0.7 K/uL    Basophils Relative 0 %   Basophils  Absolute 0.0 0.0 - 0.1 K/uL    Current Facility-Administered Medications  Medication Dose Route Frequency Provider Last Rate Last Dose  . acetaminophen (TYLENOL) tablet 650 mg  650 mg Oral Q4H PRN Fransico Meadow, PA-C      . alum & mag hydroxide-simeth (MAALOX/MYLANTA) 200-200-20 MG/5ML suspension 30 mL  30 mL Oral PRN Fransico Meadow, PA-C      . benztropine (COGENTIN) tablet 1 mg  1 mg Oral BID Patrecia Pour, NP      . haloperidol (HALDOL) tablet 10 mg  10 mg Oral BID Patrecia Pour, NP      . ondansetron [facility]) tablet 4 mg  4 mg Oral Q8H PRN Fransico Meadow, PA-C       Current Outpatient Prescriptions  Medication Sig Dispense Refill  . cyproheptadine (PERIACTIN) 4 MG tablet Take 1 tablet (4 mg total) by mouth 3 (three) times daily as needed (appetite). (Patient not taking: Reported on 01/31/2016) 30 tablet 0    Musculoskeletal: Strength & Muscle Tone: within normal limits Gait & Station: normal Patient leans: N/A  Psychiatric Specialty Exam: Review of Systems  Constitutional: Negative.   HENT: Negative.   Eyes: Negative.   Respiratory: Negative.   Cardiovascular: Negative.   Gastrointestinal: Negative.   Genitourinary: Negative.   Musculoskeletal: Negative.   Skin: Negative.   Neurological: Negative.   Endo/Heme/Allergies: Negative.   Psychiatric/Behavioral:       Delusions of grandeur, tangential thought processes    Blood pressure 143/76, pulse 71, temperature 97.7 F (36.5 C), temperature source Oral, resp. rate 18, height 6' 5"  (1.956 m), weight 117.935 kg (260 lb), SpO2 100 %.Body mass index is 30.83 kg/(m^2).  General Appearance: Casual  Eye Contact::  Good  Speech:  Normal Rate  Volume:  Normal  Mood:  Euphoric  Affect:  Congruent  Thought Process:  Coherent  Orientation:  Full (Time, Place, and Person)  Thought Content:  Delusions  Suicidal Thoughts:  No  Homicidal Thoughts:  No  Memory:  Immediate;   Fair Recent;    Fair Remote;   Fair  Judgement:  Impaired  Insight:  Fair  Psychomotor Activity:  Increased  Concentration:  Fair  Recall:  [employer] of Knowledge:Fair  Language: Fair  Akathisia:  No  Handed:  Right  AIMS (if indicated):     Assets:  Leisure Time Physical Health Resilience  ADL's:  Intact  Cognition: Impaired,  Mild  Sleep:      Treatment Plan Summary: Daily contact with patient to assess and evaluate symptoms and progress in treatment, Medication management and Plan delusional disorder with psychosis:  -Crisis stabilization -Medication management:  Discontinued his Abilify that was started yesterday and started Haldol 10 mg BID for psychosis with Cogentin 1 mg BID for EPS -Individual counseling  Disposition: Recommend psychiatric Inpatient admission when medically cleared.  Waylan Boga, NP 02/01/2016 1:28 PM Patient seen face-to-face for psychiatric evaluation, chart reviewed and case discussed with the physician extender and developed treatment plan. Reviewed the information documented and agree with the treatment plan. Corena Pilgrim, MD

## 2016-02-01 NOTE — BH Assessment (Addendum)
[facility] [phone]. She said they will accept patient once they receive IVC paperwork. They will give TTS name of accepting MD once they receive IVC. Writer will notify oncoming TTS shift. [facility] fax [phone].  Evette Cristalaroline Paige Kyann Heydt, ConnecticutLCSWA Therapeutic Triage Specialist

## 2016-02-02 DIAGNOSIS — F4325 Adjustment disorder with mixed disturbance of emotions and conduct: Secondary | ICD-10-CM

## 2016-02-02 NOTE — ED Notes (Signed)
Pt. Noted sleeping in room. No complaints or concerns voiced. No distress or abnormal behavior noted. Will continue to monitor with security cameras. Q 15 minute rounds continue. 

## 2016-02-02 NOTE — [facility] Suicide Risk Assessment (Signed)
Suicide Risk Assessment  Discharge Assessment   [facility]   Principal Problem: Adjustment disorder with mixed disturbance of emotions and conduct Discharge Diagnoses:  Patient Active Problem List   Diagnosis Date Noted  . Adjustment disorder with mixed disturbance of emotions and conduct [F43.25] 02/02/2016    Priority: High  . Delusional disorder ([facility]) [F22] 02/01/2016    Priority: High    Total Time spent with patient: 30 minutes  Musculoskeletal: Strength & Muscle Tone: within normal limits Gait & Station: normal Patient leans: N/A  Psychiatric Specialty Exam: Review of Systems  Constitutional: Negative.   HENT: Negative.   Eyes: Negative.   Respiratory: Negative.   Cardiovascular: Negative.   Gastrointestinal: Negative.   Genitourinary: Negative.   Musculoskeletal:       Left leg pain but improved  Skin: Negative.   Neurological: Negative.   Endo/Heme/Allergies: Negative.   Psychiatric/Behavioral: Negative.     Blood pressure 142/69, pulse 62, temperature 98.4 F (36.9 C), temperature source Oral, resp. rate 18, height 6\' 5"  (1.956 m), weight 117.935 kg (260 lb), SpO2 99 %.Body mass index is 30.83 kg/(m^2).  General Appearance: Casual  Eye Contact::  Good  Speech:  Normal Rate  Volume:  Normal  Mood:  Euthymic  Affect:  Congruent  Thought Process:  Coherent  Orientation:  Full (Time, Place, and Person)  Thought Content:  WDL  Suicidal Thoughts:  No  Homicidal Thoughts:  No  Memory:  Immediate;   Good Recent;   Good Remote;   Good  Judgement:  Fair  Insight:  Fair  Psychomotor Activity:  Normal  Concentration:  Good  Recall:  Good  Fund of Knowledge:Good  Language: Good  Akathisia:  No  Handed:  Right  AIMS (if indicated):     Assets:  Leisure Time Physical Health Resilience  ADL's:  Intact  Cognition: WNL  Sleep:       Mental Status Per Nursing Assessment::   On Admission:   delusions and leg pains  Demographic  Factors:  Male and Low socioeconomic status  Loss Factors: NA  Historical Factors: NA  Risk Reduction Factors:   Sense of responsibility to family and Employed  Continued Clinical Symptoms:  None  Cognitive Features That Contribute To Risk:  None    Suicide Risk:  Minimal: No identifiable suicidal ideation.  Patients presenting with no risk factors but with morbid ruminations; may be classified as minimal risk based on the severity of the depressive symptoms    Plan Of Care/Follow-up recommendations:  Activity:  as tolerated Diet:  heart healthy diet  Kyann Heydt, NP 02/02/2016, 12:11 PM

## 2016-02-02 NOTE — ED Notes (Signed)
Pt discharged ambulatory with discharge instructions.  All belongings were returned to pt.  Bus pass was given.

## 2016-02-02 NOTE — Consult Note (Signed)
[facility] Face-to-Face Psychiatry Consult   Reason for Consult:  Delusions Referring Physician:  EDP Patient Identification: Colton Lee MRN:  [id number] Principal Diagnosis: Adjustment disorder with mixed disturbance of emotions and conduct Diagnosis:   Patient Active Problem List   Diagnosis Date Noted  . Adjustment disorder with mixed disturbance of emotions and conduct [F43.25] 02/02/2016    Priority: High  . Delusional disorder (HCC) [F22] 02/01/2016    Priority: High    Total Time spent with patient: 30 minutes  Subjective:   Colton Lee is a 50 y.o. male patient does not warrant admission.  HPI:  50 yo male with an inconsistent history who announced his main issue was homelessness at the beginning of his assessment after he was told we reviewed his past frequent trips to the ED for leg pains.  The providers questioned his behaviors yesterday for authenticity, confirmed by patient today that he was not pretending yesterday.  He is focused on his leg pains but no signs of pain along with a PCP and his lack of living habitat.  Denies suicidal/homicidal ideations, hallucinations, and alcohol/drug abuse.  Resources provided, stable for discharge.  Past Psychiatric History: None  Risk to Self: Suicidal Ideation: No Suicidal Intent: No Is patient at risk for suicide?: No Suicidal Plan?: No Access to Means: No What has been your use of drugs/alcohol within the last 12 months?: Pt denies abuse.  How many times?: 0 Other Self Harm Risks: Pt denies  Triggers for Past Attempts: None known (No attempts reported, ) Intentional Self Injurious Behavior: None Risk to Others: Homicidal Ideation: No Thoughts of Harm to Others: No Current Homicidal Intent: No Current Homicidal Plan: No Access to Homicidal Means: No Identified Victim: N/A History of harm to others?: No Assessment of Violence: None Noted Violent Behavior Description: No violent behaviors observed. Pt is calm and  cooperative.  Does patient have access to weapons?: Yes (Comment) ([address]) Criminal Charges Pending?: No Does patient have a court date: No Prior Inpatient Therapy: Prior Inpatient Therapy: No Prior Outpatient Therapy: Prior Outpatient Therapy: No Does patient have an ACCT team?: No Does patient have Intensive In-House Services?  : No Does patient have [facility] services? : No Does patient have P4CC services?: No  Past Medical History:  Past Medical History  Diagnosis Date  . Foot pain     Past Surgical History  Procedure Laterality Date  . Hernia repair     Family History: History reviewed. No pertinent family history. Family Psychiatric  History: NOne Social History:  History  Alcohol Use  . Yes     History  Drug Use No    Social History   Social History  . Marital Status: Single    Spouse Name: N/A  . Number of Children: N/A  . Years of Education: N/A   Social History Main Topics  . Smoking status: Current Some Day Smoker    Types: Cigars  . Smokeless tobacco: None  . Alcohol Use: Yes  . Drug Use: No  . Sexual Activity: Not Asked   Other Topics Concern  . None   Social History Narrative   Additional Social History:    Allergies:   Allergies  Allergen Reactions  . Aspirin Itching  . Pork-Derived Products Other (See Comments)    Religious purposes   . Tetanus Toxoids Hives    Labs: No results found for this or any previous visit (from the past 48 hour(s)).  Current Facility-Administered Medications  Medication Dose Route  Frequency Provider Last Rate Last Dose  . acetaminophen (TYLENOL) tablet 650 mg  650 mg Oral Q4H PRN Elson AreasLeslie K Sofia, PA-C      . alum & mag hydroxide-simeth (MAALOX/MYLANTA) 200-200-20 MG/5ML suspension 30 mL  30 mL Oral PRN Elson AreasLeslie K Sofia, PA-C      . benztropine (COGENTIN) tablet 1 mg  1 mg Oral BID Charm RingsJamison Y Lord, NP   1 mg at 02/01/16 2121  . haloperidol (HALDOL) tablet 10 mg  10 mg Oral BID Charm RingsJamison Y Lord, NP   10 mg at  02/01/16 2121  . ondansetron (ZOFRAN) tablet 4 mg  4 mg Oral Q8H PRN Elson AreasLeslie K Sofia, PA-C       Current Outpatient Prescriptions  Medication Sig Dispense Refill  . cyproheptadine (PERIACTIN) 4 MG tablet Take 1 tablet (4 mg total) by mouth 3 (three) times daily as needed (appetite). (Patient not taking: Reported on 01/31/2016) 30 tablet 0    Musculoskeletal: Strength & Muscle Tone: within normal limits Gait & Station: normal Patient leans: N/A  Psychiatric Specialty Exam: Review of Systems  Constitutional: Negative.   HENT: Negative.   Eyes: Negative.   Respiratory: Negative.   Cardiovascular: Negative.   Gastrointestinal: Negative.   Genitourinary: Negative.   Musculoskeletal:       Left leg pain but improved  Skin: Negative.   Neurological: Negative.   Endo/Heme/Allergies: Negative.   Psychiatric/Behavioral: Negative.     Blood pressure 142/69, pulse 62, temperature 98.4 F (36.9 C), temperature source Oral, resp. rate 18, height 6\' 5"  (1.956 m), weight 117.935 kg (260 lb), SpO2 99 %.Body mass index is 30.83 kg/(m^2).  General Appearance: Casual  Eye Contact::  Good  Speech:  Normal Rate  Volume:  Normal  Mood:  Euthymic  Affect:  Congruent  Thought Process:  Coherent  Orientation:  Full (Time, Place, and Person)  Thought Content:  WDL  Suicidal Thoughts:  No  Homicidal Thoughts:  No  Memory:  Immediate;   Good Recent;   Good Remote;   Good  Judgement:  Fair  Insight:  Fair  Psychomotor Activity:  Normal  Concentration:  Good  Recall:  Good  Fund of Knowledge:Good  Language: Good  Akathisia:  No  Handed:  Right  AIMS (if indicated):     Assets:  Leisure Time Physical Health Resilience  ADL's:  Intact  Cognition: WNL  Sleep:      Treatment Plan Summary: Daily contact with patient to assess and evaluate symptoms and progress in treatment, Medication management and Plan adjustment disorder with mixed disturbance of emotions and conduct: -Crisis  management -Medication management:  Medications stopped after patient revealed his actions -Individual counseling -Resources provided for homeless shelters   Disposition: No evidence of imminent risk to self or others at present.    Nanine MeansLORD, JAMISON, NP 02/02/2016 9:34 AM Patient seen face-to-face for psychiatric evaluation, chart reviewed and case discussed with the physician extender and developed treatment plan. Reviewed the information documented and agree with the treatment plan. Thedore MinsMojeed Cheynne Virden, MD

## 2016-02-02 NOTE — BH Assessment (Signed)
[facility] Assessment Progress Note  Per Thedore MinsMojeed Akintayo, MD, this pt does not require psychiatric hospitalization at this time.  [facility].  This has been included in pt's discharge instructions.  Dr Jannifer FranklinAkintayo also recommends that pt be provided with information about area homeless shelters.  Pt's nurse, Kendal Hymendie, has been notified, and she has been provided with printed homeless shelter information.  Doylene Canninghomas Cordarrel Stiefel, MA Triage Specialist [phone]

## 2016-02-02 NOTE — Discharge Instructions (Signed)
For your ongoing mental health needs, you are advised to follow up with [facility].  New and returning patients are seen at their walk-in clinic.  Walk-in hours are Monday - Friday from 8:00 am - 3:00 pm.  Walk-in patients are seen on a first come, first served basis.  Try to arrive as early as possible for he best chance of being seen the same day: ° °     [facility] °     [address] °     [place], [place] [address] °     [phone] °

## 2016-08-08 DIAGNOSIS — M79606 Pain in leg, unspecified: Secondary | ICD-10-CM | POA: Insufficient documentation

## 2016-08-08 DIAGNOSIS — F17290 Nicotine dependence, other tobacco product, uncomplicated: Secondary | ICD-10-CM | POA: Insufficient documentation

## 2016-08-08 DIAGNOSIS — G8929 Other chronic pain: Secondary | ICD-10-CM | POA: Insufficient documentation

## 2016-08-08 DIAGNOSIS — Z79899 Other long term (current) drug therapy: Secondary | ICD-10-CM | POA: Insufficient documentation

## 2016-08-08 NOTE — ED Triage Notes (Signed)
Pt states that he got bit by a spider 5 years ago and went untreated. Now has swelling in his shins x 2 years. Alert and oriented.

## 2016-08-09 ENCOUNTER — Emergency Department (HOSPITAL_COMMUNITY)
Admission: EM | Admit: 2016-08-09 | Discharge: 2016-08-09 | Disposition: A | Discharge status: Home / Self Care | Payer: Self-pay | Attending: Emergency Medicine | Admitting: Emergency Medicine

## 2016-08-09 DIAGNOSIS — G8929 Other chronic pain: Secondary | ICD-10-CM

## 2016-08-09 DIAGNOSIS — M79606 Pain in leg, unspecified: Secondary | ICD-10-CM

## 2016-08-09 MED ORDER — ACETAMINOPHEN 500 MG PO TABS
1000.0000 mg | ORAL_TABLET | Freq: Once | ORAL | Status: AC
  Administered 2016-08-09: 1000 mg via ORAL
  Filled 2016-08-09: qty 2

## 2016-08-09 MED ORDER — NAPROXEN 500 MG PO TABS: ORAL_TABLET | ORAL | 0 refills | Status: DC

## 2016-08-09 NOTE — ED Notes (Signed)
Bed: WA05 Expected date:  Expected time:  Means of arrival:  Comments: 

## 2016-08-09 NOTE — Discharge Instructions (Signed)
You can take acetaminophen for pain with the prescription. [facility].

## 2016-08-09 NOTE — ED Provider Notes (Signed)
[facility]-EMERGENCY DEPT Provider Note   CSN: [id number] Arrival date & time: 08/08/16  2337  By signing my name below, I, Colton Lee, attest that this documentation has been prepared under the direction and in the presence of [name], MD . Electronically Signed: [name], Scribe. 08/09/2016. 1:46 AM.  Time seen 01:52 AM  History   Chief Complaint Chief Complaint  Patient presents with  . Leg Swelling   The history is provided by the patient. No language interpreter was used.   HPI Comments:  Colton Lee is a 50 y.o. male with PMHx of Adjustment disorder and delusional disorder who presents to the Emergency Department complaining of constant unchanged bilateral leg swelling onset last night. Pt reports that he was bitten by a spider 3-4 years ago and notes that his symptoms are similar to that incident. Pt endorses associated loss of appetite and "burning" pain in his abdomen. He denies any fever, wound, SOB, chest pain,abdominal swelling, nausea or vomiting.  Pt smokes several cigarettes a day. On some questions, patients answers are very vague.   He also reports a surgical history on his left index finger. Pt states that "poison came out of his finger" after the spider bite.  PCP: Dr. Daphine Deutscher (???),  [phone] Past Medical History:  Diagnosis Date  . Foot pain     Patient Active Problem List   Diagnosis Date Noted  . 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 HERNIA REPAIR         Home Medications    Prior to Admission medications   Medication Sig Start Date End Date Taking? Authorizing Provider  cyproheptadine (PERIACTIN) 4 MG tablet Take 1 tablet (4 mg total) by mouth 3 (three) times daily as needed (appetite). Patient not taking: Reported on 01/31/2016 11/16/15   Dione Booze, MD  naproxen (NAPROSYN) 500 MG tablet Take 1  po BID with food prn pain 08/09/16   [name], MD    Family History No family history on file.  Social History Social History  Substance Use Topics  . Smoking status: Current Some Day Smoker    Types: Cigars  . Smokeless tobacco: Not on file  . Alcohol use Yes  states he works at [employer] with all the flags and the [employer] Appears to be homeless   Allergies   Aspirin; Pork-derived products; and Tetanus toxoids   Review of Systems Review of Systems  Constitutional: Positive for appetite change. Negative for fever.  Cardiovascular: Positive for leg swelling.  Gastrointestinal: Positive for abdominal pain. Negative for nausea and vomiting.       Negative for Abdominal Swelling   Skin: Negative for wound.  All other systems reviewed and are negative.    Physical Exam Updated Vital Signs BP 123/95 (BP Location: Right Arm)   Pulse 65   Temp 97.8 F (36.6 C) (Oral)   Resp 20   SpO2 100%   Vital signs normal    Physical Exam  Constitutional: He is oriented to person, place, and time. He appears well-developed and well-nourished.  Non-toxic appearance. He does not appear ill. No distress.  HENT:  Head: Normocephalic and atraumatic.  Right Ear: External ear normal.  Left Ear: External ear normal.  Nose: Nose normal. No mucosal edema or rhinorrhea.  Mouth/Throat: Oropharynx is clear and moist and mucous membranes are normal. No dental abscesses or uvula  swelling.  Eyes: Conjunctivae and EOM are normal. Pupils are equal, round, and reactive to light.  Neck: Normal range of motion and full passive range of motion without pain. Neck supple.  Cardiovascular: Normal rate, regular rhythm and normal heart sounds.  Exam reveals no gallop and no friction rub.   No murmur heard. Pulmonary/Chest: Effort normal and breath sounds normal. No respiratory distress. He has no wheezes. He has no rhonchi. He has no rales. He exhibits no tenderness and no crepitus.  Abdominal: Soft. Normal  appearance and bowel sounds are normal. He exhibits no distension. There is no tenderness. There is no rebound and no guarding.  Musculoskeletal: Normal range of motion. He exhibits no edema or tenderness.  There is no obvious swelling noted to his legs  Neurological: He is alert and oriented to person, place, and time. He has normal strength. No cranial nerve deficit.  Skin: Skin is warm, dry and intact. No rash noted. No erythema. No pallor.  Psychiatric: He has a normal mood and affect. His speech is normal and behavior is normal. His mood appears not anxious.  Nursing note and vitals reviewed.    ED Treatments / Results   Procedures Procedures (including critical care time)  Medications Ordered in ED Medications  acetaminophen (TYLENOL) tablet 1,000 mg (not administered)     Initial Impression / Assessment and Plan / ED Course  I have reviewed the triage vital signs and the nursing notes.  Pertinent labs & imaging results that were available during my care of the patient were reviewed by me and considered in my medical decision making (see chart for details).  Clinical Course   COORDINATION OF CARE:  2:10 AM Discussed treatment plan with pt at bedside and pt agreed to plan.  Patient kept telling me to [facility], and  In care everywhere he has multiple ED visits in  [facility]. He was seen here in March for delusions and homelessness and chronic leg pain. At this point I don't see anything acute going on with his legs. There is no obvious edema, he says his shins are sore, he does not have calf pain, he has no chest pain or shortness of breath to suggest DVT. Patient was given acetaminophen and a prescription for naproxen to take for his leg pain.  Final Clinical Impressions(s) / ED Diagnoses   Final diagnoses:  Chronic leg pain, unspecified laterality    New Prescriptions New Prescriptions   NAPROXEN (NAPROSYN) 500 MG  TABLET    Take 1 po BID with food prn pain    Plan discharge  Devoria AlbeIva Novi Calia, MD, FACEP   I personally performed the services described in this documentation, which was scribed in my presence. The recorded information has been reviewed and considered.  Devoria AlbeIva Kassim Guertin, MD, Concha PyoFACEP     Karolyn Messing, MD 08/09/16 [phone]

## 2016-09-19 ENCOUNTER — Encounter (HOSPITAL_COMMUNITY): Payer: Self-pay | Admitting: Nurse Practitioner

## 2016-09-19 DIAGNOSIS — G8929 Other chronic pain: Secondary | ICD-10-CM | POA: Insufficient documentation

## 2016-09-19 DIAGNOSIS — M79645 Pain in left finger(s): Secondary | ICD-10-CM | POA: Insufficient documentation

## 2016-09-19 DIAGNOSIS — M792 Neuralgia and neuritis, unspecified: Secondary | ICD-10-CM | POA: Insufficient documentation

## 2016-09-19 DIAGNOSIS — F17290 Nicotine dependence, other tobacco product, uncomplicated: Secondary | ICD-10-CM | POA: Insufficient documentation

## 2016-09-19 NOTE — ED Triage Notes (Signed)
Pt states his chronic nerve pain is worse today rating it 8/10.

## 2016-09-20 ENCOUNTER — Emergency Department (HOSPITAL_COMMUNITY)
Admission: EM | Admit: 2016-09-20 | Discharge: 2016-09-20 | Disposition: A | Discharge status: Home / Self Care | Payer: Self-pay | Attending: Emergency Medicine | Admitting: Emergency Medicine

## 2016-09-20 DIAGNOSIS — G8929 Other chronic pain: Secondary | ICD-10-CM

## 2016-09-20 DIAGNOSIS — M792 Neuralgia and neuritis, unspecified: Secondary | ICD-10-CM

## 2016-09-20 MED ORDER — OXYCODONE-ACETAMINOPHEN 5-325 MG PO TABS
1.0000 | ORAL_TABLET | Freq: Once | ORAL | Status: AC
  Administered 2016-09-20: 1 via ORAL
  Filled 2016-09-20: qty 1

## 2016-09-20 MED ORDER — GABAPENTIN 300 MG PO CAPS: 300.0000 mg | ORAL_CAPSULE | Freq: Two times a day (BID) | ORAL | 0 refills | Status: AC

## 2016-09-20 MED ORDER — TRAMADOL HCL 50 MG PO TABS: 50.0000 mg | ORAL_TABLET | Freq: Four times a day (QID) | ORAL | 0 refills | Status: AC | PRN

## 2016-09-20 NOTE — ED Notes (Signed)
Bed: WA05 Expected date:  Expected time:  Means of arrival:  Comments: 

## 2016-09-20 NOTE — ED Provider Notes (Signed)
[facility]ER[facility]Y DEPT Provider Note   CSN: [id number] Arrival date & time: 09/19/16  2338  By signing my name below, I, Majel HomerPeyton Lee, attest that this documentation has been prepared under the direction and in the presence of Gilda Creasehristopher J Pollina, MD . Electronically Signed: Majel HomerPeyton Lee, Scribe. 09/20/2016. 12:32 AM.  History   Chief Complaint Chief Complaint  Patient presents with  . Pain   The history is provided by the patient. No language interpreter was used.   HPI Comments: Colton Lee is a 50 y.o. male brought in by EMS to the Emergency Department complaining of an 8/10, exacerbation of chronic nerve pain in his left second digit that began today. 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 He states associated bilateral leg swelling. Pt reports he was given antibiotics in the past for his chronic pain but he has not taken them because they "make him sleepy."   Past Medical History:  Diagnosis Date  . Foot pain     Patient Active Problem List   Diagnosis Date Noted  . 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 HERNIA REPAIR      Home Medications    Prior to Admission medications   Medication Sig Start Date End Date Taking? Authorizing Provider  cyproheptadine (PERIACTIN) 4 MG tablet Take 1 tablet (4 mg total) by mouth 3 (three) times daily as needed (appetite). Patient not taking: Reported on 01/31/2016 11/16/15   Dione Boozeavid Glick, MD  gabapentin (NEURONTIN) 300 MG capsule Take 1 capsule (300 mg total) by mouth 2 (two) times daily. 09/20/16   Gilda Creasehristopher J Pollina, MD    naproxen (NAPROSYN) 500 MG tablet Take 1 po BID with food prn pain 08/09/16   Devoria AlbeIva Knapp, MD  traMADol (ULTRAM) 50 MG tablet Take 1 tablet (50 mg total) by mouth every 6 (six) hours as needed. 09/20/16   Gilda Creasehristopher J Pollina, MD    Family History No family history on file.  Social History Social History  Substance Use Topics  . Smoking status: Current Some Day Smoker    Types: Cigars  . Smokeless tobacco: Not on file  . Alcohol use Yes     Allergies   Aspirin; Pork-derived products; and Tetanus toxoids   Review of Systems Review of Systems  Cardiovascular: Positive for leg swelling.  Skin: Positive for wound.   Physical Exam Updated Vital Signs BP 121/95 (BP Location: Left Arm)   Pulse 84   Temp 98.5 F (36.9 C) (Oral)   Resp 14   SpO2 100%   Physical Exam  Constitutional: He is oriented to person, place, and time. He appears well-developed and well-nourished. No distress.  HENT:  Head: Normocephalic and atraumatic.  Right Ear: Hearing normal.  Left Ear: Hearing normal.  Nose: Nose normal.  Mouth/Throat: Oropharynx is clear and moist and mucous membranes are normal.  Eyes: Conjunctivae and EOM are normal. Pupils are equal, round, and reactive to light.  Neck: Normal range of motion. Neck supple.  Cardiovascular: Regular rhythm, S1 normal and S2 normal.  Exam reveals no gallop and no friction rub.  No murmur heard. Pulmonary/Chest: Effort normal and breath sounds normal. No respiratory distress. He exhibits no tenderness.  Abdominal: Soft. Normal appearance and bowel sounds are normal. There is no hepatosplenomegaly. There is no tenderness. There is no rebound, no guarding, no tenderness at McBurney's point and negative Murphy's sign. No hernia.  Musculoskeletal: Normal range of motion.  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   Neurological: He is alert and oriented to person, place, and time. He has normal  strength. No cranial nerve deficit or sensory deficit. Coordination normal. GCS eye subscore is 4. GCS verbal subscore is 5. GCS motor subscore is 6.  Skin: Skin is warm, dry and intact. No rash noted. No cyanosis.  Psychiatric: He has a normal mood and affect. His speech is normal and behavior is normal. Thought content normal.  Nursing note and vitals reviewed.  ED Treatments / Results  Labs (all labs ordered are listed, but only abnormal results are displayed) Labs Reviewed - No data to display  EKG  EKG Interpretation None       Radiology No results found.  Procedures Procedures (including critical care time)  Medications Ordered in ED Medications  oxyCODONE-acetaminophen (PERCOCET/ROXICET) 5-325 MG per tablet 1 tablet (not administered)    DIAGNOSTIC STUDIES:  Oxygen Saturation is 100% on RA, normal by my interpretation.    COORDINATION OF CARE:  12:30 AM Discussed treatment plan with pt at bedside and pt agreed to plan.  Initial Impression / Assessment and Plan / ED Course  I have reviewed the triage vital signs and the nursing notes.  Pertinent labs & imaging results that were available during my care of the patient were reviewed by me and considered in my medical decision making (see chart for details).  Clinical Course   Patient presents with complaints of increased pain in his left index finger. 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 He [facility], but never had the surgery. He currently is not on any medications. Patient does not have any signs of active infection in the finger. No redness, swelling, fluctuance, induration or drainage. Patient counseled that he needs to [facility], will prescribe Ultram and Neurontin.  I personally performed the services described in this documentation, which was scribed in my  presence. The recorded information has been reviewed and is accurate.   Final Clinical Impressions(s) / ED Diagnoses   Final diagnoses:  Neuropathic pain  Other chronic pain    New Prescriptions New Prescriptions   GABAPENTIN (NEURONTIN) 300 MG CAPSULE    Take 1 capsule (300 mg total) by mouth 2 (two) times daily.   TRAMADOL (ULTRAM) 50 MG TABLET    Take 1 tablet (50 mg total) by mouth every 6 (six) hours as needed.     Gilda Creasehristopher J Pollina, MD 09/20/16 [phone]

## 2016-11-02 ENCOUNTER — Encounter (HOSPITAL_COMMUNITY): Payer: Self-pay | Admitting: Emergency Medicine

## 2016-11-02 ENCOUNTER — Emergency Department (HOSPITAL_COMMUNITY)
Admission: EM | Admit: 2016-11-02 | Discharge: 2016-11-02 | Disposition: A | Discharge status: Home / Self Care | Payer: Self-pay | Attending: Emergency Medicine | Admitting: Emergency Medicine

## 2016-11-02 DIAGNOSIS — R52 Pain, unspecified: Secondary | ICD-10-CM | POA: Insufficient documentation

## 2016-11-02 DIAGNOSIS — F17290 Nicotine dependence, other tobacco product, uncomplicated: Secondary | ICD-10-CM | POA: Insufficient documentation

## 2016-11-02 MED ORDER — IBUPROFEN 400 MG PO TABS
600.0000 mg | ORAL_TABLET | Freq: Once | ORAL | Status: DC
  Filled 2016-11-02: qty 1

## 2016-11-02 NOTE — ED Notes (Signed)
Call pt.x2 no answer when called to get vitalsigns updated

## 2016-11-02 NOTE — ED Provider Notes (Signed)
[facility]-EMERGENCY DEPT Provider Note   CSN: [id number] Arrival date & time: 11/02/16  0055     History   Chief Complaint Chief Complaint  Patient presents with  . Back Pain  . Leg Pain    HPI Colton Lee is a 50 y.o. male.  HPI   Patient is a 50 year old male with history of delusional disorder presents to the ED with complaint of leg pain. Patient reports "I came here tonight because I'm having body pains all over which I been having for the past 5 years since I was bitten by a snake and poison has gone into my body". Patient denies currently having any pain but states "I know poison is in my body which is causing me to feel like this". Patient reports "I sometimes feel limbless". Patient states the symptoms have been present for the past 5 years. Denies fever, chills, chest pain, shortness of breath, abdominal pain, nausea, vomiting, diarrhea, urinary symptoms, back pain, rash, swelling. Patient denies taking any medications for her symptoms. Denies any alcohol or drug use. Denies SI, HI, or visual/auditory hallucinations.  Past Medical History:  Diagnosis Date  . Foot pain     Patient Active Problem List   Diagnosis Date Noted  . 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 HERNIA REPAIR         Home Medications    Prior to Admission medications   Medication Sig Start Date End Date Taking? Authorizing Provider  cyproheptadine (PERIACTIN) 4 MG tablet Take 1 tablet (4 mg total) by mouth 3 (three) times daily as needed (appetite). Patient not taking: Reported on 01/31/2016 11/16/15   Dione Boozeavid Glick, MD  gabapentin (NEURONTIN) 300 MG capsule Take 1 capsule (300 mg total) by mouth 2 (two) times daily. 09/20/16   Gilda Creasehristopher J Pollina, MD  naproxen (NAPROSYN) 500 MG tablet Take 1 po BID with food prn pain 08/09/16   Devoria AlbeIva Knapp, MD  traMADol  (ULTRAM) 50 MG tablet Take 1 tablet (50 mg total) by mouth every 6 (six) hours as needed. 09/20/16   Gilda Creasehristopher J Pollina, MD    Family History No family history on file.  Social History Social History  Substance Use Topics  . Smoking status: Current Some Day Smoker    Types: Cigars  . Smokeless tobacco: Never Used  . Alcohol use Yes     Allergies   Aspirin; Pork-derived products; and Tetanus toxoids   Review of Systems Review of Systems  Musculoskeletal: Positive for myalgias.  All other systems reviewed and are negative.    Physical Exam Updated Vital Signs BP 116/80   Pulse 72   Temp 98.5 F (36.9 C) (Oral)   Resp 16   SpO2 97%   Physical Exam  Constitutional: He is oriented to person, place, and time. He appears well-developed and well-nourished. No distress.  HENT:  Head: Normocephalic and atraumatic.  Mouth/Throat: Uvula is midline, oropharynx is clear and moist and mucous membranes are normal. No oropharyngeal exudate, posterior oropharyngeal edema, posterior oropharyngeal erythema or tonsillar abscesses. No tonsillar exudate.  Eyes: Conjunctivae and EOM are normal. Right eye exhibits no discharge. Left eye exhibits no discharge. No scleral icterus.  Neck: Normal range of motion. Neck supple.  Cardiovascular: Normal rate, regular rhythm, normal heart sounds and intact distal pulses.   Pulmonary/Chest: Effort normal and breath sounds normal. No respiratory distress. He has no  wheezes. He has no rales. He exhibits no tenderness.  Abdominal: Soft. Bowel sounds are normal. He exhibits no distension and no mass. There is no tenderness. There is no rebound and no guarding.  Musculoskeletal: Normal range of motion. He exhibits no edema, tenderness or deformity.  Neurological: He is alert and oriented to person, place, and time.  Skin: Skin is warm and dry. No rash noted. He is not diaphoretic.  Psychiatric: He has a normal mood and affect. His behavior is normal. His  speech is tangential.  Nursing note and vitals reviewed.    ED Treatments / Results  Labs (all labs ordered are listed, but only abnormal results are displayed) Labs Reviewed - No data to display  EKG  EKG Interpretation None       Radiology No results found.  Procedures Procedures (including critical care time)  Medications Ordered in ED Medications  ibuprofen (ADVIL,MOTRIN) tablet 600 mg (not administered)     Initial Impression / Assessment and Plan / ED Course  I have reviewed the triage vital signs and the nursing notes.  Pertinent labs & imaging results that were available during my care of the patient were reviewed by me and considered in my medical decision making (see chart for details).  Clinical Course     Patient presents with complaints of body pain. He reports the symptoms have been present for the past 5 years. History of delusional disorder. VSS. Exam revealed patient with tangential speech. Denies SI, HI, or hallucinations. Remaining exam unremarkable. Upon further questioning patient reports he only came here because he wanted ibuprofen. Pt reports "I don't want any blood work and just want to leave". Discussed pt with Dr. Bebe ShaggyWickline. Due to pt with no SI, HI, hallucinations or evidence of being a harm to himself or others, I feel pt is appropriate to be discharged home without any further workup or imaging. Discussed plan for d/c with pt along with return precautions.   Final Clinical Impressions(s) / ED Diagnoses   Final diagnoses:  Body aches    New Prescriptions New Prescriptions   No medications on file     Barrett Henleicole Elizabeth Aveya Beal, [place]PA-C 11/02/16 0630    Zadie Rhineonald Wickline, MD 11/03/16 2350

## 2016-11-02 NOTE — ED Triage Notes (Addendum)
Patient reports persistent /chronic bilateral lower legs pain for several months and low back pain this week , denies injury / ambulatory , no hematuria or dysuria , pt. added dry skin at bilateral hands for several days.

## 2016-11-02 NOTE — ED Notes (Signed)
Pt refused lab work.

## 2016-11-02 NOTE — ED Notes (Addendum)
Pt noted to be standing in the doorway and became agitated with this RN. Pt speaking with raised voice stating "how long is it going to take." RN informed that pt needs to go back into his room in order to receive his medication. This RN informed the pt that she was in the process of pulling the medication. The pt responded "well I will be out in the reception." This RN asked for clarity. The pt repeated himself. This RN informed the pt that if he left the room and went into the  Pt continued to talk loudly and appeared to become aggressive. This RN shut the door to the medication room. It sounded as if the pt was standing closely outside the door. This RN left out of the other side of the medication room for her personal safety. Pt left prior to receiving medication or discharge paper.

## 2016-11-02 NOTE — ED Notes (Addendum)
Pt states he came in today due to inflammatory of his nerves. Pt not making any sense. Pt believe he was bit by a bug and has symptoms that he did not have before. Pt speaking in broken sentences. Pt does not appear to finish one sentence before starting another.  Pt states he got bit approx 5 yrs ago. Pt does not know what bit him. He believes it was a spider or a snake or something.   Pt A&O x3. Pt disoriented to month.

## 2017-08-25 ENCOUNTER — Emergency Department (HOSPITAL_COMMUNITY)
Admission: EM | Admit: 2017-08-25 | Discharge: 2017-08-25 | Disposition: A | Discharge status: Home / Self Care | Payer: Self-pay | Attending: Emergency Medicine | Admitting: Emergency Medicine

## 2017-08-25 DIAGNOSIS — R4689 Other symptoms and signs involving appearance and behavior: Secondary | ICD-10-CM | POA: Insufficient documentation

## 2017-08-25 DIAGNOSIS — Z046 Encounter for general psychiatric examination, requested by authority: Secondary | ICD-10-CM | POA: Insufficient documentation

## 2017-08-25 DIAGNOSIS — E860 Dehydration: Secondary | ICD-10-CM | POA: Insufficient documentation

## 2017-08-25 DIAGNOSIS — Z590 Homelessness: Secondary | ICD-10-CM | POA: Insufficient documentation

## 2017-08-25 DIAGNOSIS — R4182 Altered mental status, unspecified: Secondary | ICD-10-CM | POA: Insufficient documentation

## 2017-08-25 DIAGNOSIS — F17290 Nicotine dependence, other tobacco product, uncomplicated: Secondary | ICD-10-CM | POA: Insufficient documentation

## 2017-08-25 LAB — COMPREHENSIVE METABOLIC PANEL
ALT: 18 U/L (ref 17–63)
AST: 24 U/L (ref 15–41)
Albumin: 4 g/dL (ref 3.5–5.0)
Alkaline Phosphatase: 74 U/L (ref 38–126)
Anion gap: 8 (ref 5–15)
BUN: 26 mg/dL — ABNORMAL HIGH (ref 6–20)
CO2: 26 mmol/L (ref 22–32)
Calcium: 9.2 mg/dL (ref 8.9–10.3)
Chloride: 108 mmol/L (ref 101–111)
Creatinine, Ser: 1.38 mg/dL — ABNORMAL HIGH (ref 0.61–1.24)
GFR calc Af Amer: >60 mL/min (ref >60)
GFR calc non Af Amer: 58 mL/min — ABNORMAL LOW (ref >60)
Glucose, Bld: 105 mg/dL — ABNORMAL HIGH (ref 65–99)
Potassium: 3.9 mmol/L (ref 3.5–5.1)
Sodium: 142 mmol/L (ref 135–145)
Total Bilirubin: 0.4 mg/dL (ref 0.3–1.2)
Total Protein: 7 g/dL (ref 6.5–8.1)

## 2017-08-25 LAB — CBC WITH DIFFERENTIAL/PLATELET
Basophils Absolute: 0 10*3/uL (ref 0.0–0.1)
Basophils Relative: 1 %
Eosinophils Absolute: 0.2 10*3/uL (ref 0.0–0.7)
Eosinophils Relative: 4 %
HCT: 40 % (ref 39.0–52.0)
Hemoglobin: 13.5 g/dL (ref 13.0–17.0)
Lymphocytes Relative: 33 %
Lymphs Abs: 1.5 10*3/uL (ref 0.7–4.0)
MCH: 27.8 pg (ref 26.0–34.0)
MCHC: 33.8 g/dL (ref 30.0–36.0)
MCV: 82.5 fL (ref 78.0–100.0)
Monocytes Absolute: 0.2 10*3/uL (ref 0.1–1.0)
Monocytes Relative: 5 %
Neutro Abs: 2.5 10*3/uL (ref 1.7–7.7)
Neutrophils Relative %: 57 %
Platelets: 208 10*3/uL (ref 150–400)
RBC: 4.85 MIL/uL (ref 4.22–5.81)
RDW: 14.5 % (ref 11.5–15.5)
WBC: 4.3 10*3/uL (ref 4.0–10.5)

## 2017-08-25 LAB — RAPID URINE DRUG SCREEN, HOSP PERFORMED
Amphetamines: NOT DETECTED
Barbiturates: NOT DETECTED
Benzodiazepines: NOT DETECTED
Cocaine: NOT DETECTED
Opiates: NOT DETECTED
Tetrahydrocannabinol: NOT DETECTED

## 2017-08-25 LAB — ETHANOL: Alcohol, Ethyl (B): <10 mg/dL (ref <10)

## 2017-08-25 MED ORDER — SODIUM CHLORIDE 0.9 % IV BOLUS (SEPSIS)
1000.0000 mL | Freq: Once | INTRAVENOUS | Status: AC
  Administered 2017-08-25: 1000 mL via INTRAVENOUS

## 2017-08-25 MED ORDER — SODIUM CHLORIDE 0.9 % IV BOLUS (SEPSIS): 1000.0000 mL | Freq: Once | INTRAVENOUS | Status: DC

## 2017-08-25 NOTE — Discharge Instructions (Signed)
Please read attached information. If you experience any new or worsening signs or symptoms please return to the emergency room for evaluation. Please follow-up with your primary care provider or specialist as discussed. Please use medication prescribed only as directed and discontinue taking if you have any concerning signs or symptoms.   °

## 2017-08-25 NOTE — BH Assessment (Addendum)
Assessment Note  Colton Lee is an 51 y.o. male. Patient presents to [facility], voluntarily. He was transported by EMS. The EMS staff report that patient may have mental health issues as he was found laying down behind a store. TTS ordered and Clinical research associate assessed patient. Patient presents with complaints of "dizziness, headache, and fatigue". States that he laid down behind the store because he didn't feel well and hasn't felt well in several days. He was asked if he has any psychiatric complaints and he sts, "No..Marland KitchenI don't feel good and I'm really dizzy". Patient was asked if he has a psychiatric history and sts, "No". Patient further denies suicidal ideations and denies a history of suicide attempts/gestures. No self mutilating behaviors. Denies depressive symptoms. Denies stressors stating, "I'm very happy". Denies a family history of mental health illness/diagnosis.. Denies HI. Denies a history of aggressive or assaultive behaviors. Denies legal issues. Denies AVH's. Patient, however appears to be responding to internal stimuli. Writer observed patient looking around the room in a bizarre manner. He also laughed inappropriately as this Clinical research associate was leaving the room. Patient is otherwise appropriate. He denies prior history of INPT treatment. Denies having a outpatient therapist or psychiatrist. Patient appears disheveled. His affect was bizarre. Speech is normal. Mood is appropriate. He was oriented to person, place, and situation. He was confused about the month an stated that it's November.   Diagnosis:  Psychosis; Unspecified  Past Medical History:  Past Medical History:  Diagnosis Date  . Foot pain     Past Surgical History:  Procedure Laterality Date  . HERNIA REPAIR      Family History: No family history on file.  Social History:  reports that he has been smoking Cigars.  He has never used smokeless tobacco. He reports that he drinks alcohol. He reports that he does not use  drugs.  Additional Social History:  Alcohol / Drug Use Pain Medications: SEE MAR Prescriptions: SEE MAR Over the Counter: SEE MAR History of alcohol / drug use?: No history of alcohol / drug abuse  CIWA: CIWA-Ar BP: (!) 150/95 Pulse Rate: 87 COWS:    Allergies:  Allergies  Allergen Reactions  . Aspirin Itching  . Pork-Derived Products Other (See Comments)    Religious purposes   . Tetanus Toxoids Hives    Home Medications:  (Not in a hospital admission)  OB/GYN Status:  No LMP for male patient.  General Assessment Data TTS Assessment: In system Is this a Tele or Face-to-Face Assessment?: Face-to-Face Is this an Initial Assessment or a Re-assessment for this encounter?: Initial Assessment Marital status: Single Maiden name:  (n/a) Is patient pregnant?: No Pregnancy Status: No Living Arrangements: Other (Comment) (homeless) Can pt return to current living arrangement?: Yes Admission Status: Voluntary Is patient capable of signing voluntary admission?: Yes Referral Source: Self/Family/Friend Insurance type:  (Self Pay )     Crisis Care Plan Living Arrangements: Other (Comment) (homeless) Legal Guardian: Other: (no legal guardian ) Name of Psychiatrist:  (No psychiatrist per patient ) Name of Therapist:  (No therapist per patient)  Education Status Is patient currently in school?: No Current Grade:  (n/a) Highest grade of school patient has completed:  [employer]") Name of school:  (n/a) Contact person:  (n/a)  Risk to self with the past 6 months Suicidal Ideation: No Has patient been a risk to self within the past 6 months prior to admission? : No Suicidal Intent: No Has patient had any suicidal intent within the past 6 months prior  to admission? : No Is patient at risk for suicide?: No Suicidal Plan?: No Has patient had any suicidal plan within the past 6 months prior to admission? : No Access to Means: No Previous Attempts/Gestures: Yes How many  times?:  (0) Other Self Harm Risks:  (n/a) Triggers for Past Attempts:  (denies prior attempts to harm self ) Intentional Self Injurious Behavior: None Family Suicide History: No Recent stressful life event(s): Other (Comment) ("I'm tired of working") Persecutory voices/beliefs?: No Depression: No Depression Symptoms:  (n/a) Substance abuse history and/or treatment for substance abuse?: No Suicide prevention information given to non-admitted patients: Not applicable  Risk to Others within the past 6 months Homicidal Ideation: No Does patient have any lifetime risk of violence toward others beyond the six months prior to admission? : No Thoughts of Harm to Others: No Current Homicidal Intent: No Current Homicidal Plan: No Access to Homicidal Means: No Identified Victim:  (n/a) History of harm to others?: No Assessment of Violence: None Noted Violent Behavior Description:  (currently calm and cooperative ) Does patient have access to weapons?: No Criminal Charges Pending?: No Does patient have a court date: No Is patient on probation?: No  Psychosis Hallucinations:  (pt denies; observed responding to internal stimulu) Delusions: Unspecified  Mental Status Report Appearance/Hygiene: Disheveled Eye Contact: Good Motor Activity: Restlessness Speech: Logical/coherent Level of Consciousness: Alert Mood: Preoccupied Affect: Preoccupied Anxiety Level: None Thought Processes: Circumstantial, Coherent, Relevant Judgement: Partial Orientation: Person, Situation, Place (Pt sts it is November) Obsessive Compulsive Thoughts/Behaviors: None  Cognitive Functioning Concentration: Decreased Memory: Remote Intact, Recent Intact IQ: Average Insight: Fair Impulse Control: Fair Appetite: Fair Weight Loss:  (none reported) Weight Gain:  (none reported) Sleep: Decreased Total Hours of Sleep:  ("I'm not sure") Vegetative Symptoms: None  ADLScreening [facility] Assessment  Services) Patient's cognitive ability adequate to safely complete daily activities?: Yes Patient able to express need for assistance with ADLs?: Yes Independently performs ADLs?: Yes (appropriate for developmental age)  Prior Inpatient Therapy Prior Inpatient Therapy: No Prior Therapy Dates:  (n/a) Prior Therapy Facilty/Provider(s):  (n/a) Reason for Treatment:  (n/a)  Prior Outpatient Therapy Prior Outpatient Therapy: No Prior Therapy Dates:  (n/a) Prior Therapy Facilty/Provider(s):  (n/a) Reason for Treatment:  (n/a) Does patient have an ACCT team?: No Does patient have Intensive In-House Services?  : No Does patient have [facility] services? : No Does patient have P4CC services?: No  ADL Screening (condition at time of admission) Patient's cognitive ability adequate to safely complete daily activities?: Yes Is the patient deaf or have difficulty hearing?: No Does the patient have difficulty seeing, even when wearing glasses/contacts?: No Does the patient have difficulty concentrating, remembering, or making decisions?: No Patient able to express need for assistance with ADLs?: Yes Does the patient have difficulty dressing or bathing?: No Independently performs ADLs?: Yes (appropriate for developmental age) Does the patient have difficulty walking or climbing stairs?: No Weakness of Legs: None Weakness of Arms/Hands: None  Home Assistive Devices/Equipment Home Assistive Devices/Equipment: None    Abuse/Neglect Assessment (Assessment to be complete while patient is alone) Physical Abuse: Denies Verbal Abuse: Denies Sexual Abuse: Denies Exploitation of patient/patient's resources: Denies Self-Neglect: Denies Values / Beliefs Cultural Requests During Hospitalization: None Spiritual Requests During Hospitalization: None   Advance Directives (For Healthcare) Does Patient Have a Medical Advance Directive?: No Would patient like information on creating a medical advance  directive?: No - Patient declined Nutrition Screen- [facility] Patient's home diet: Regular  Additional Information 1:1 In Past  12 Months?: No CIRT Risk: No Elopement Risk: No Does patient have medical clearance?: Yes     Disposition:  Disposition Initial Assessment Completed for this Encounter: Yes  On Site Evaluation by:   Reviewed with Physician:    Melynda Ripple 08/25/2017 2:33 PM

## 2017-08-25 NOTE — ED Triage Notes (Signed)
Per EMS pt was found laying down behind a convenient store , alert , high BP with EMS, unknown Hx HTN. Appeared confused per ESM, possible psych issue per EMS. Hx homelessness. denies pain.

## 2017-08-25 NOTE — ED Provider Notes (Addendum)
[facility]-EMERGENCY DEPT Provider Note   CSN: [id number] Arrival date & time: 08/25/17  1309     History   Chief Complaint Chief Complaint  Patient presents with  . Hypertension    HPI Colton Lee is a 51 y.o. male.  HPI  level V caveat due to patient's mental status  51 year old male presents today via EMS after being found down. Upon walking into the room patient is resting in exam bed. Patient reports that he "felt like the ear was too hot today" and felt like he was going to faint. Patient is mumbling, reports that he was taking a rest, reporting he does not have a permanent resident. Patient reports that he does not take medications has not taken medications, denies any chronic past medical history, denies any drug or alcohol use. He reports he is hurting all over. Patient denies any neurological deficits. Patient denies any suicidal, homicidal ideations. He denies any visual or auditory hallucinations.  Past Medical History:  Diagnosis Date  . Foot pain     Patient Active Problem List   Diagnosis Date Noted  . 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 HERNIA REPAIR         Home Medications    Prior to Admission medications   Medication Sig Start Date End Date Taking? Authorizing Provider  cyproheptadine (PERIACTIN) 4 MG tablet Take 1 tablet (4 mg total) by mouth 3 (three) times daily as needed (appetite). Patient not taking: Reported on 01/31/2016 11/16/15   Dione Booze, MD  gabapentin (NEURONTIN) 300 MG capsule Take 1 capsule (300 mg total) by mouth 2 (two) times daily. Patient not taking: Reported on 08/25/2017 09/20/16   Gilda Crease, MD  naproxen (NAPROSYN) 500 MG tablet Take 1 po BID with food prn pain Patient not taking: Reported on 08/25/2017 08/09/16   [name], MD  traMADol (ULTRAM) 50 MG tablet Take 1 tablet  (50 mg total) by mouth every 6 (six) hours as needed. Patient not taking: Reported on 08/25/2017 09/20/16   Gilda Crease, MD    Family History No family history on file.  Social History Social History  Substance Use Topics  . Smoking status: Current Some Day Smoker    Types: Cigars  . Smokeless tobacco: Never Used  . Alcohol use Yes     Allergies   Aspirin; Pork-derived products; and Tetanus toxoids   Review of Systems Review of Systems  All other systems reviewed and are negative.    Physical Exam Updated Vital Signs BP (!) 142/102   Pulse 76   Temp 97.9 F (36.6 C) (Oral)   Resp 18   SpO2 97%   Physical Exam  Constitutional: He appears well-developed and well-nourished.  HENT:  Head: Normocephalic and atraumatic.  Eyes: Pupils are equal, round, and reactive to light. Conjunctivae are normal. Right eye exhibits no discharge. Left eye exhibits no discharge. No scleral icterus.  Neck: Normal range of motion. No JVD present. No tracheal deviation present.  Cardiovascular: Normal rate, regular rhythm and normal heart sounds.   Pulmonary/Chest: Effort normal and breath sounds normal. No stridor. No respiratory distress. He has no wheezes. He has no rales. He exhibits no tenderness.  Neurological: He is alert. No cranial nerve deficit or sensory deficit. He exhibits normal muscle tone. Coordination normal.  Psychiatric: He has a normal mood and affect. His behavior is  normal. Judgment and thought content normal.  Nursing note and vitals reviewed.    ED Treatments / Results  Labs (all labs ordered are listed, but only abnormal results are displayed) Labs Reviewed  COMPREHENSIVE METABOLIC PANEL - Abnormal; Notable for the following:       Result Value   Glucose, Bld 105 (*)    BUN 26 (*)    Creatinine, Ser 1.38 (*)    GFR calc non Af Amer 58 (*)    All other components within normal limits  ETHANOL  CBC WITH DIFFERENTIAL/PLATELET  RAPID URINE DRUG  SCREEN, HOSP PERFORMED    EKG  EKG Interpretation  Date/Time:  Thursday August 25 2017 14:02:03 EDT Ventricular Rate:  81 PR Interval:    QRS Duration: 97 QT Interval:  380 QTC Calculation: 442 R Axis:   94 Text Interpretation:  Sinus rhythm Consider left atrial enlargement Borderline right axis deviation Confirmed by Benjiman Core [phone]) on 08/25/2017 4:29:23 PM       Radiology No results found.  Procedures Procedures (including critical care time)  Medications Ordered in ED Medications  sodium chloride 0.9 % bolus 1,000 mL (not administered)  sodium chloride 0.9 % bolus 1,000 mL (1,000 mLs Intravenous New Bag/Given 08/25/17 1623)     Initial Impression / Assessment and Plan / ED Course  I have reviewed the triage vital signs and the nursing notes.  Pertinent labs & imaging results that were available during my care of the patient were reviewed by me and considered in my medical decision making (see chart for details).      Final Clinical Impressions(s) / ED Diagnoses   Final diagnoses:  Dehydration  Abnormal behavior    Labs: CBC, CMP, ethanol, urine rapid drug screen  Imaging:  Consults:  Therapeutics:Normal saline  Discharge Meds:   Assessment/Plan: Prior to my evaluation the patient I entered the room as he was talking to the wall mumbling. Patient had difficulty with providing significant details, often mumbling, with tangential speech. He often stopped and laughed throughout. His speech was clear with no slurred speech, neurologically he appears intact. Physically patient does not appear to be in any acute distress, concern for psychiatric illness. Patient will be medical cleared and TTS will be evaluated.   TTS evaluated patient, there is no clear medical concern for his abnormal behavior. They recommend overnight observation with reassessment in the morning. Patient is dehydrated here, with elevation in his creatinine BUN. He will be given  several liters of fluid, suspect this is secondary to heat exposure and inadequate access to fluids.  Further management here. Patient does not appear to be a threat to himself or others, no clinical indication for hospitalization or inpatient management from a psychiatric perspective. He will be discharged with outpatient follow-up for his elevated kidney function. Patient verbalized understanding and agreement to today's plan had no further questions or concerns.  New Prescriptions New Prescriptions   No medications on file     Rosalio Loud 08/25/17 1633    Benjiman Core, MD 08/25/17 1636    Eyvonne Mechanic, PA-C 08/25/17 1643    Benjiman Core, MD 08/26/17 [phone]

## 2020-01-25 ENCOUNTER — Encounter (HOSPITAL_COMMUNITY): Payer: Self-pay | Admitting: Emergency Medicine

## 2020-01-25 ENCOUNTER — Other Ambulatory Visit: Payer: Self-pay

## 2020-01-25 ENCOUNTER — Emergency Department (HOSPITAL_COMMUNITY): Payer: Self-pay

## 2020-01-25 ENCOUNTER — Emergency Department (HOSPITAL_COMMUNITY)
Admission: EM | Admit: 2020-01-25 | Discharge: 2020-01-25 | Disposition: A | Discharge status: Home / Self Care | Payer: Self-pay | Attending: Emergency Medicine | Admitting: Emergency Medicine

## 2020-01-25 DIAGNOSIS — Z590 Homelessness: Secondary | ICD-10-CM | POA: Insufficient documentation

## 2020-01-25 DIAGNOSIS — R410 Disorientation, unspecified: Secondary | ICD-10-CM | POA: Insufficient documentation

## 2020-01-25 DIAGNOSIS — M79672 Pain in left foot: Secondary | ICD-10-CM | POA: Insufficient documentation

## 2020-01-25 DIAGNOSIS — F17290 Nicotine dependence, other tobacco product, uncomplicated: Secondary | ICD-10-CM | POA: Insufficient documentation

## 2020-01-25 DIAGNOSIS — Z20822 Contact with and (suspected) exposure to covid-19: Secondary | ICD-10-CM | POA: Insufficient documentation

## 2020-01-25 DIAGNOSIS — M79671 Pain in right foot: Secondary | ICD-10-CM | POA: Insufficient documentation

## 2020-01-25 DIAGNOSIS — F22 Delusional disorders: Secondary | ICD-10-CM | POA: Insufficient documentation

## 2020-01-25 LAB — CBC WITH DIFFERENTIAL/PLATELET
Abs Immature Granulocytes: 0.01 10*3/uL (ref 0.00–0.07)
Basophils Absolute: 0 10*3/uL (ref 0.0–0.1)
Basophils Relative: 1 %
Eosinophils Absolute: 0.1 10*3/uL (ref 0.0–0.5)
Eosinophils Relative: 3 %
HCT: 42.3 % (ref 39.0–52.0)
Hemoglobin: 13 g/dL (ref 13.0–17.0)
Immature Granulocytes: 0 %
Lymphocytes Relative: 35 %
Lymphs Abs: 1.7 10*3/uL (ref 0.7–4.0)
MCH: 26.4 pg (ref 26.0–34.0)
MCHC: 30.7 g/dL (ref 30.0–36.0)
MCV: 86 fL (ref 80.0–100.0)
Monocytes Absolute: 0.5 10*3/uL (ref 0.1–1.0)
Monocytes Relative: 10 %
Neutro Abs: 2.4 10*3/uL (ref 1.7–7.7)
Neutrophils Relative %: 51 %
Platelets: 233 10*3/uL (ref 150–400)
RBC: 4.92 MIL/uL (ref 4.22–5.81)
RDW: 15.7 % — ABNORMAL HIGH (ref 11.5–15.5)
WBC: 4.7 10*3/uL (ref 4.0–10.5)
nRBC: 0 % (ref 0.0–0.2)

## 2020-01-25 LAB — RAPID URINE DRUG SCREEN, HOSP PERFORMED
Amphetamines: NOT DETECTED
Barbiturates: NOT DETECTED
Benzodiazepines: NOT DETECTED
Cocaine: NOT DETECTED
Opiates: NOT DETECTED
Tetrahydrocannabinol: NOT DETECTED

## 2020-01-25 LAB — POC SARS CORONAVIRUS 2 AG -  ED: SARS Coronavirus 2 Ag: NEGATIVE

## 2020-01-25 LAB — COMPREHENSIVE METABOLIC PANEL
ALT: 22 U/L (ref 0–44)
AST: 16 U/L (ref 15–41)
Albumin: 3.9 g/dL (ref 3.5–5.0)
Alkaline Phosphatase: 68 U/L (ref 38–126)
Anion gap: 7 (ref 5–15)
BUN: 21 mg/dL — ABNORMAL HIGH (ref 6–20)
CO2: 24 mmol/L (ref 22–32)
Calcium: 8.7 mg/dL — ABNORMAL LOW (ref 8.9–10.3)
Chloride: 109 mmol/L (ref 98–111)
Creatinine, Ser: 0.98 mg/dL (ref 0.61–1.24)
GFR calc Af Amer: >60 mL/min (ref >60)
GFR calc non Af Amer: >60 mL/min (ref >60)
Glucose, Bld: 91 mg/dL (ref 70–99)
Potassium: 3.9 mmol/L (ref 3.5–5.1)
Sodium: 140 mmol/L (ref 135–145)
Total Bilirubin: 0.5 mg/dL (ref 0.3–1.2)
Total Protein: 7.1 g/dL (ref 6.5–8.1)

## 2020-01-25 LAB — ETHANOL: Alcohol, Ethyl (B): <10 mg/dL (ref <10)

## 2020-01-25 LAB — LACTIC ACID, PLASMA: Lactic Acid, Venous: 0.9 mmol/L (ref 0.5–1.9)

## 2020-01-25 LAB — AMMONIA: Ammonia: 38 umol/L — ABNORMAL HIGH (ref 9–35)

## 2020-01-25 LAB — SARS CORONAVIRUS 2 (TAT 6-24 HRS): SARS Coronavirus 2: NEGATIVE

## 2020-01-25 MED ORDER — NAPROXEN 500 MG PO TABS: ORAL_TABLET | ORAL | 0 refills | Status: DC

## 2020-01-25 NOTE — ED Notes (Signed)
4 extra gold tops and 1 extra blue top sent down to lab
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
## 2020-01-25 NOTE — ED Provider Notes (Signed)
Received signout the beginning of shift.  Please see previous providers notes for complete H&P.  This is a 54 year old male with past medical history of delusional disorder presenting complaining of generalized aches and pain.  He is a poor historian but unable to really truly explain if he has any specific localized complaint.  Work-up was done and no acute finding was noted.  Mildly elevated ammonia level of 38 not likely related to his presenting complaint.  At this time he is well rested, appears to be alert and oriented, answers questions appropriately.  Rapid COVID-19 test is negative.  COVID-19 PCR is currently pending.  Colton Lee was evaluated in Emergency Department on 01/25/2020 for the symptoms described in the history of present illness. He was evaluated in the context of the global COVID-19 pandemic, which necessitated consideration that the patient might be at risk for infection with the SARS-CoV-2 virus that causes COVID-19. Institutional protocols and algorithms that pertain to the evaluation of patients at risk for COVID-19 are in a state of rapid change based on information released by regulatory bodies including the CDC and federal and state organizations. These policies and algorithms were followed during the patient's care in the ED.   BP (!) 150/95 (BP Location: Right Arm)   Pulse 75   Temp 98.2 F (36.8 C) (Oral)   Resp 20   Ht 6' 1"  (1.854 m)   Wt 136.1 kg   SpO2 100%   BMI 39.58 kg/m   Results for orders placed or performed during the hospital encounter of 01/25/20  Comprehensive metabolic panel  Result Value Ref Range   Sodium 140 135 - 145 mmol/L   Potassium 3.9 3.5 - 5.1 mmol/L   Chloride 109 98 - 111 mmol/L   CO2 24 22 - 32 mmol/L   Glucose, Bld 91 70 - 99 mg/dL   BUN 21 (H) 6 - 20 mg/dL   Creatinine, Ser 0.98 0.61 - 1.24 mg/dL   Calcium 8.7 (L) 8.9 - 10.3 mg/dL   Total Protein 7.1 6.5 - 8.1 g/dL   Albumin 3.9 3.5 - 5.0 g/dL   AST 16 15 - 41 U/L   ALT 22 0 - 44 U/L   Alkaline Phosphatase 68 38 - 126 U/L   Total Bilirubin 0.5 0.3 - 1.2 mg/dL   GFR calc non Af Amer >60 >60 mL/min   GFR calc Af Amer >60 >60 mL/min   Anion gap 7 5 - 15  CBC with Differential  Result Value Ref Range   WBC 4.7 4.0 - 10.5 K/uL   RBC 4.92 4.22 - 5.81 MIL/uL   Hemoglobin 13.0 13.0 - 17.0 g/dL   HCT 42.3 39.0 - 52.0 %   MCV 86.0 80.0 - 100.0 fL   MCH 26.4 26.0 - 34.0 pg   MCHC 30.7 30.0 - 36.0 g/dL   RDW 15.7 (H) 11.5 - 15.5 %   Platelets 233 150 - 400 K/uL   nRBC 0.0 0.0 - 0.2 %   Neutrophils Relative % 51 %   Neutro Abs 2.4 1.7 - 7.7 K/uL   Lymphocytes Relative 35 %   Lymphs Abs 1.7 0.7 - 4.0 K/uL   Monocytes Relative 10 %   Monocytes Absolute 0.5 0.1 - 1.0 K/uL   Eosinophils Relative 3 %   Eosinophils Absolute 0.1 0.0 - 0.5 K/uL   Basophils Relative 1 %   Basophils Absolute 0.0 0.0 - 0.1 K/uL   Immature Granulocytes 0 %   Abs Immature Granulocytes  0.01 0.00 - 0.07 K/uL  Lactic acid, plasma  Result Value Ref Range   Lactic Acid, Venous 0.9 0.5 - 1.9 mmol/L  Ethanol  Result Value Ref Range   Alcohol, Ethyl (B) <10 <10 mg/dL  Urine rapid drug screen (hosp performed)  Result Value Ref Range   Opiates NONE DETECTED NONE DETECTED   Cocaine NONE DETECTED NONE DETECTED   Benzodiazepines NONE DETECTED NONE DETECTED   Amphetamines NONE DETECTED NONE DETECTED   Tetrahydrocannabinol NONE DETECTED NONE DETECTED   Barbiturates NONE DETECTED NONE DETECTED  Ammonia  Result Value Ref Range   Ammonia 38 (H) 9 - 35 umol/L  POC SARS Coronavirus 2 Ag-ED - Nasal Swab (BD Veritor Kit)  Result Value Ref Range   SARS Coronavirus 2 Ag NEGATIVE NEGATIVE   CT Head Wo Contrast  Result Date: 01/25/2020 CLINICAL DATA:  54 year old male with history of encephalopathy. Body aches and chills. EXAM: CT HEAD WITHOUT CONTRAST TECHNIQUE: Contiguous axial images were obtained from the base of the skull through the vertex without intravenous contrast. COMPARISON:  Head  CT 04/01/2016. FINDINGS: Brain: No evidence of acute infarction, hemorrhage, hydrocephalus, extra-axial collection or mass lesion/mass effect. Vascular: No hyperdense vessel or unexpected calcification. Skull: Normal. Negative for fracture or focal lesion. Sinuses/Orbits: No acute finding. Other: None. IMPRESSION: 1. No acute intracranial abnormalities. The appearance of the brain is normal. Electronically Signed   By: Vinnie Langton M.D.   On: 01/25/2020 07:52      Domenic Moras, PA-C 01/25/20 [id number]    Maudie Flakes, MD 01/29/20 [phone]

## 2020-01-25 NOTE — ED Triage Notes (Signed)
Patient complaining of body chills because he been standing in the air. Patient is a homeless.

## 2020-01-25 NOTE — ED Notes (Signed)
(  4) Gold, (1) Blue saved in main lab

## 2020-01-30 LAB — CULTURE, BLOOD (ROUTINE X 2)
Culture: NO GROWTH
Culture: NO GROWTH

## 2021-11-06 ENCOUNTER — Encounter (HOSPITAL_COMMUNITY): Payer: Self-pay

## 2021-11-06 ENCOUNTER — Other Ambulatory Visit: Payer: Self-pay

## 2021-11-06 ENCOUNTER — Emergency Department (HOSPITAL_COMMUNITY)
Admission: EM | Admit: 2021-11-06 | Discharge: 2021-11-06 | Disposition: A | Discharge status: Home / Self Care | Payer: Self-pay | Attending: Emergency Medicine | Admitting: Emergency Medicine

## 2021-11-06 DIAGNOSIS — Z20822 Contact with and (suspected) exposure to covid-19: Secondary | ICD-10-CM | POA: Insufficient documentation

## 2021-11-06 DIAGNOSIS — B349 Viral infection, unspecified: Secondary | ICD-10-CM | POA: Insufficient documentation

## 2021-11-06 DIAGNOSIS — F17290 Nicotine dependence, other tobacco product, uncomplicated: Secondary | ICD-10-CM | POA: Insufficient documentation

## 2021-11-06 LAB — RESP PANEL BY RT-PCR (FLU A&B, COVID) ARPGX2
Influenza A by PCR: NEGATIVE
Influenza B by PCR: NEGATIVE
SARS Coronavirus 2 by RT PCR: NEGATIVE

## 2021-11-06 NOTE — Discharge Instructions (Signed)
You were evaluated in the Emergency Department and after careful evaluation, we did not find any emergent condition requiring admission or further testing in the hospital.  Your exam/testing today was overall reassuring.  Please return to the Emergency Department if you experience any worsening of your condition.  Thank you for allowing us to be a part of your care.  

## 2021-11-06 NOTE — ED Provider Notes (Signed)
[facility]-EMERGENCY DEPT [facility] Emergency Department Provider Note MRN:  [id number]  Arrival date & time: 11/06/21     Chief Complaint   Flu like symptoms    History of Present Illness   Colton Lee is a 54 y.o. year-old male with no pertinent past medical history presenting to the ED with chief complaint of flulike symptoms.  Cough, congestion, headache, and generalized body aches and pains.  Present for 2 days.  Gradual onset, constant.  Denies fever, no chest pain or shortness of breath, no abdominal pain.  Mild.  Review of Systems  A problem-focused ROS was performed. Positive for body aches, headache, congestion.  Patient denies fever.  Patient's Health History    Past Medical History:  Diagnosis Date   Foot pain     Past Surgical History:  Procedure Laterality Date   HERNIA REPAIR      History reviewed. No pertinent family history.  Social History   Socioeconomic History   Marital status: Single    Spouse name: Not on file   Number of children: Not on file   Years of education: Not on file   Highest education level: Not on file  Occupational History   Not on file  Tobacco Use   Smoking status: Some Days    Types: Cigars   Smokeless tobacco: Never  Substance and Sexual Activity   Alcohol use: Yes   Drug use: No   Sexual activity: Not on file  Other Topics Concern   Not on file  Social History Narrative   Not on file   Social Determinants of Health   Financial Resource Strain: Not on file  Food Insecurity: Not on file  Transportation Needs: Not on file  Physical Activity: Not on file  Stress: Not on file  Social Connections: Not on file  Intimate Partner Violence: Not on file     Physical Exam   Vitals:   11/06/21 0136 11/06/21 0545  BP: (!) 150/102 (!) 134/95  Pulse: 94 80  Resp: 17 16  Temp: 98.8 F (37.1 C) 98.1 F (36.7 C)  SpO2: 99% 100%    CONSTITUTIONAL: Well-appearing, NAD NEURO:  Alert and oriented x 3, no focal  deficits EYES:  eyes equal and reactive ENT/NECK:  no LAD, no JVD CARDIO: Regular rate, well-perfused, normal S1 and S2 PULM:  CTAB no wheezing or rhonchi GI/GU:  normal bowel sounds, non-distended, non-tender MSK/SPINE:  No gross deformities, no edema SKIN:  no rash, atraumatic PSYCH:  Appropriate speech and behavior  *Additional and/or pertinent findings included in MDM below  Diagnostic and Interventional Summary    EKG Interpretation  Date/Time:    Ventricular Rate:    PR Interval:    QRS Duration:   QT Interval:    QTC Calculation:   R Axis:     Text Interpretation:         Labs Reviewed  RESP PANEL BY RT-PCR (FLU A&B, COVID) [id number]    No orders to display    Medications - No data to display   Procedures  /  Critical Care Procedures  ED Course and Medical Decision Making  I have reviewed the triage vital signs, the nursing notes, and pertinent available records from the EMR.  Listed above are laboratory and imaging tests that I personally ordered, reviewed, and interpreted and then considered in my medical decision making (see below for details).  Suspect viral illness, vital signs normal, no increased work of breathing, lungs clear, nothing to suggest  emergent process, appropriate for discharge.       Elmer Sow. Pilar Plate, MD [facility] Emergency Medicine [facility] [email or web address]  Final Clinical Impressions(s) / ED Diagnoses     ICD-10-CM   1. Viral illness  B34.9       ED Discharge Orders     None        Discharge Instructions Discussed with and Provided to Patient:    Discharge Instructions      You were evaluated in the Emergency Department and after careful evaluation, we did not find any emergent condition requiring admission or further testing in the hospital.  Your exam/testing today was overall reassuring.  Please return to the Emergency Department if you experience any worsening of your condition.  Thank you  for allowing [place] to be a part of your care.        Sabas Sous, MD 11/06/21 [phone]

## 2021-11-06 NOTE — ED Triage Notes (Signed)
Patient arrives with complaint of flu like symptoms that began 2 days ago. Pt endorses cough, congestion, headache, and generalized body aches and pains. Hx of HTN

## 2022-03-03 IMAGING — CT CT HEAD W/O CM
3 series · 16 of 47 positions shown, 19 images · non-contrast
Comparison: Head CT 04/01/2016.

CLINICAL DATA: 53-year-old male with history of encephalopathy.
Body aches and chills.

EXAM:
CT HEAD WITHOUT CONTRAST
TECHNIQUE: Contiguous axial images were obtained from the base of the skull
through the vertex without intravenous contrast.

[Series 2: head wo · axial · 0.47mm/px · z∈[-72,+68]mm · 10 of 34 slices shown, 13 images]
[im 3/34  brain]
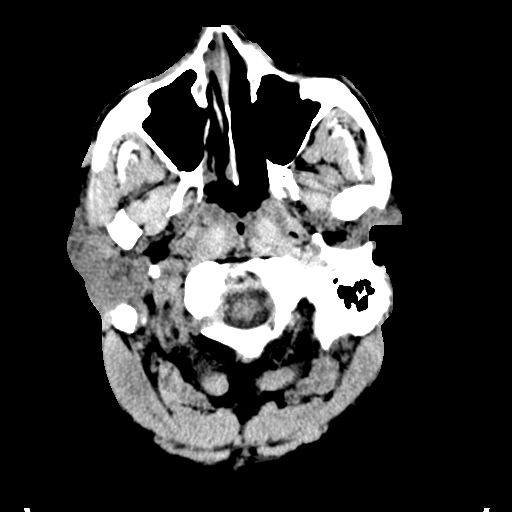
[im 3/34  bone]
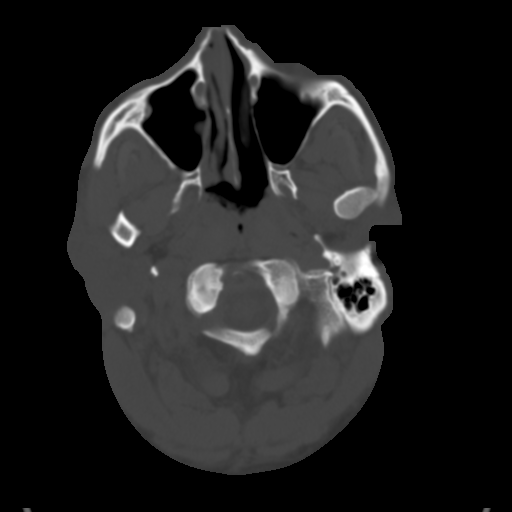
[im 6/34  brain]
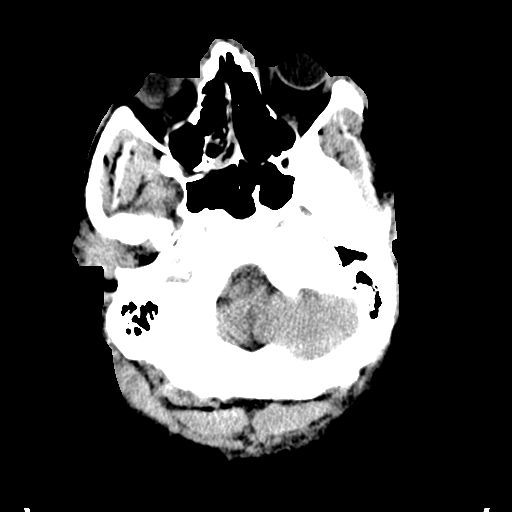
[im 10/34  brain]
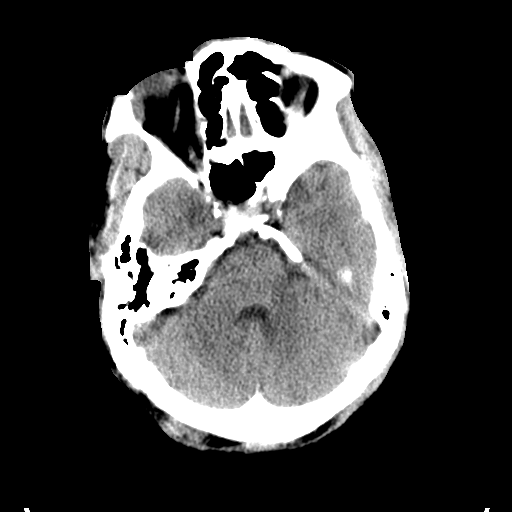
[im 12/34  brain]
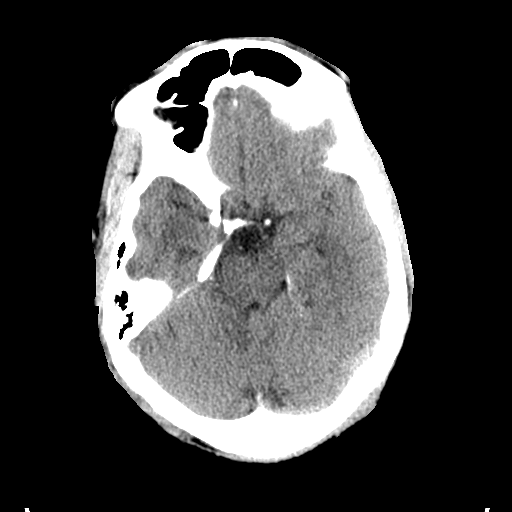
[im 15/34  brain]
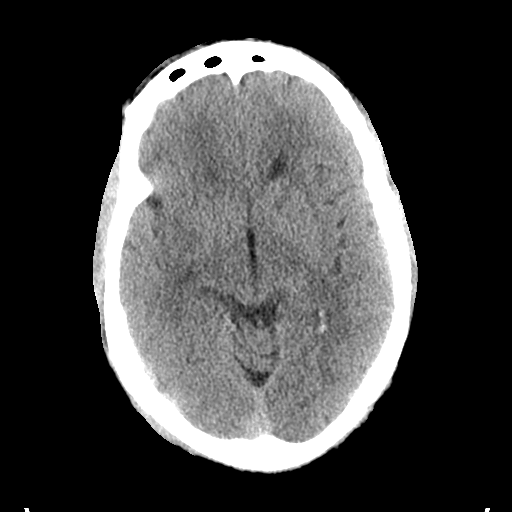
[im 15/34  bone]
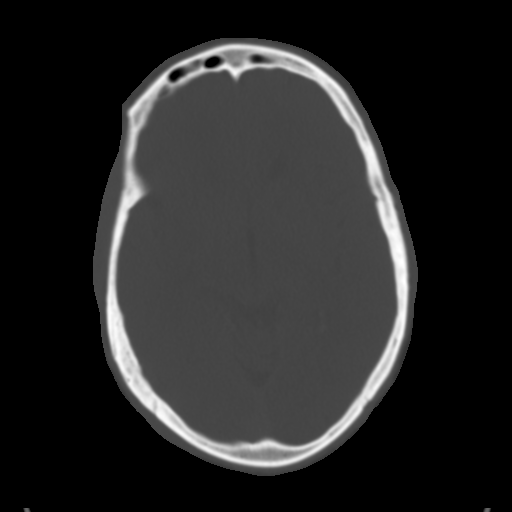
[im 19/34  brain]
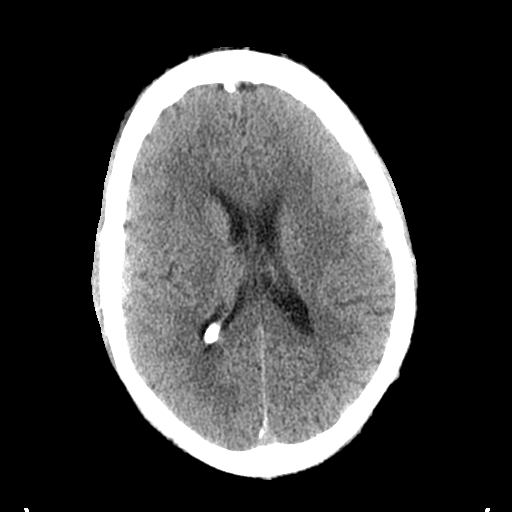
[im 22/34  brain]
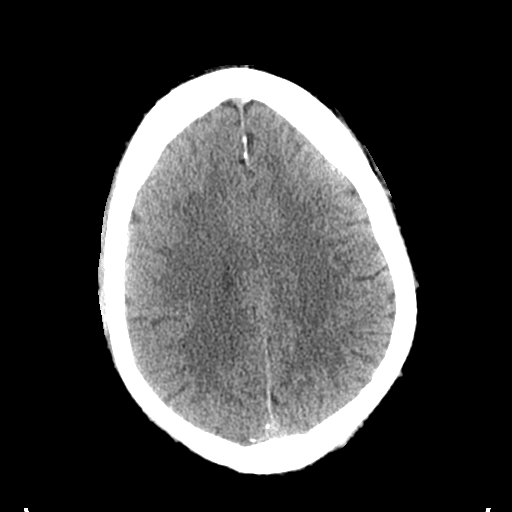
[im 26/34  brain]
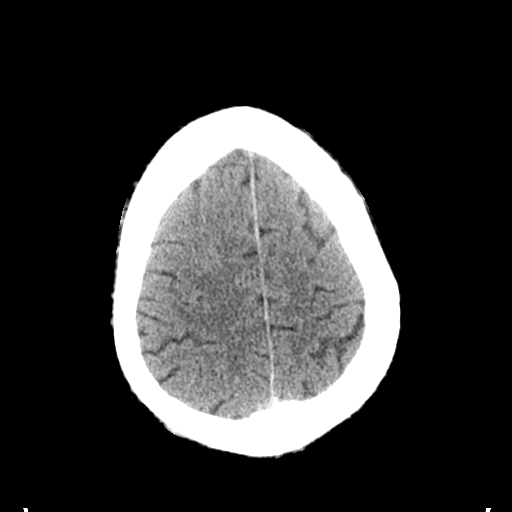
[im 28/34  brain]
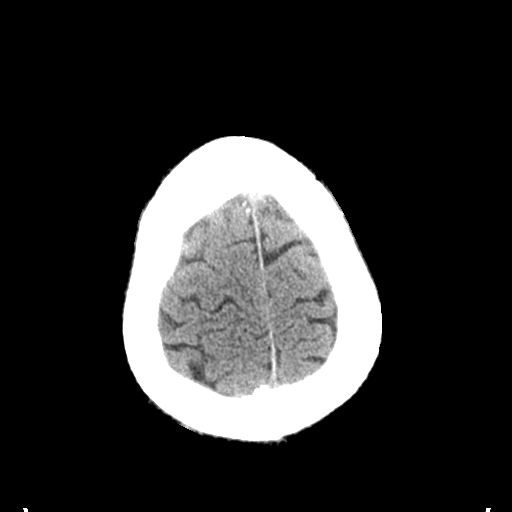
[im 28/34  bone]
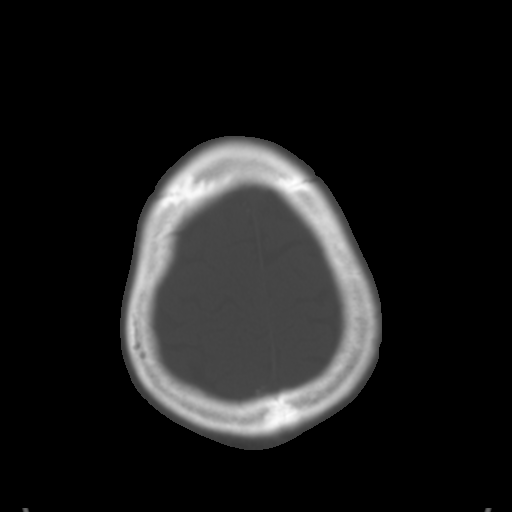
[im 31/34  brain]
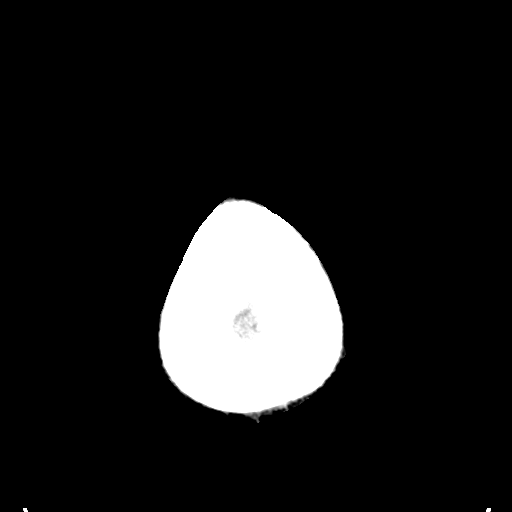

[Series 4: coronal soft tissue · coronal · 0.32mm/px · 3 of 71 slices shown]
[im 24/71  brain]
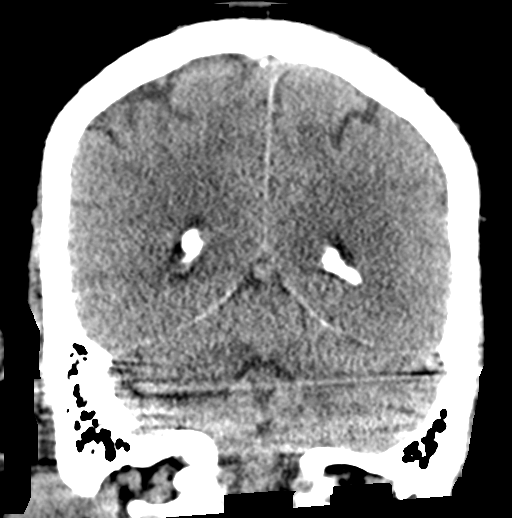
[im 32/71  brain]
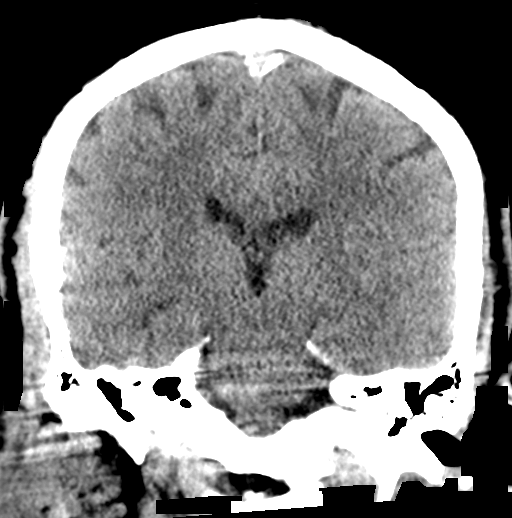
[im 39/71  brain]
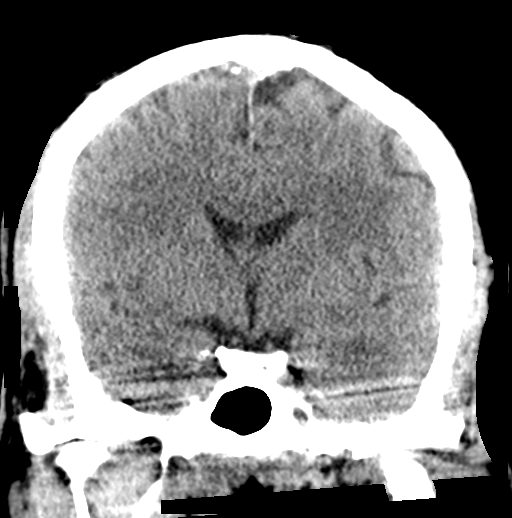

[Series 5: sagittal soft tissue · sagittal · 0.33mm/px · 3 of 53 slices shown]
[im 18/53  brain]
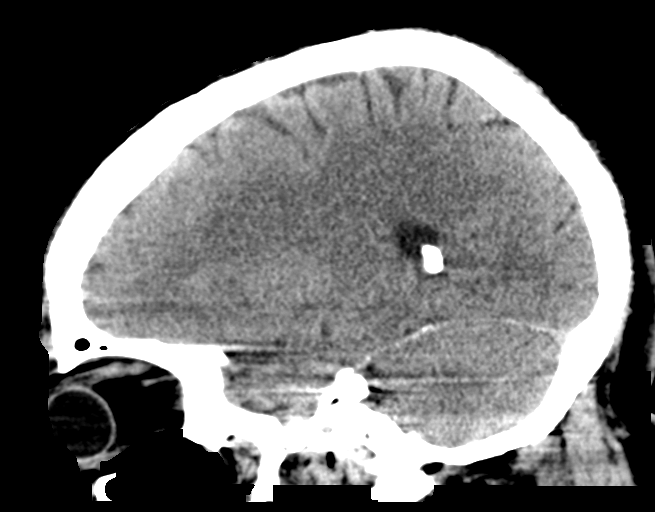
[im 27/53  brain]
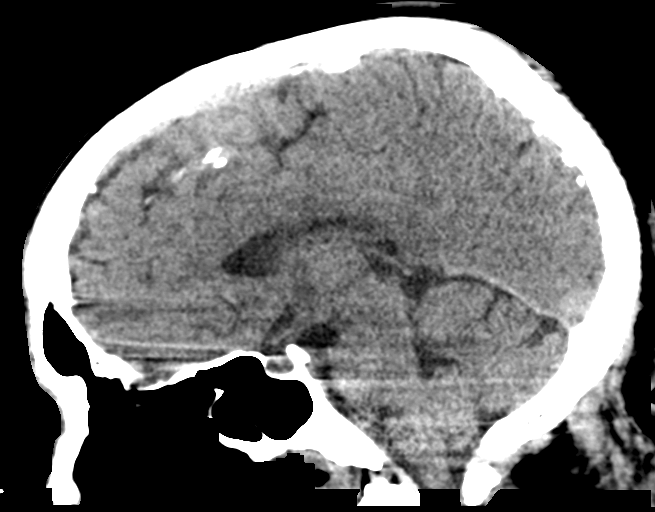
[im 35/53  brain]
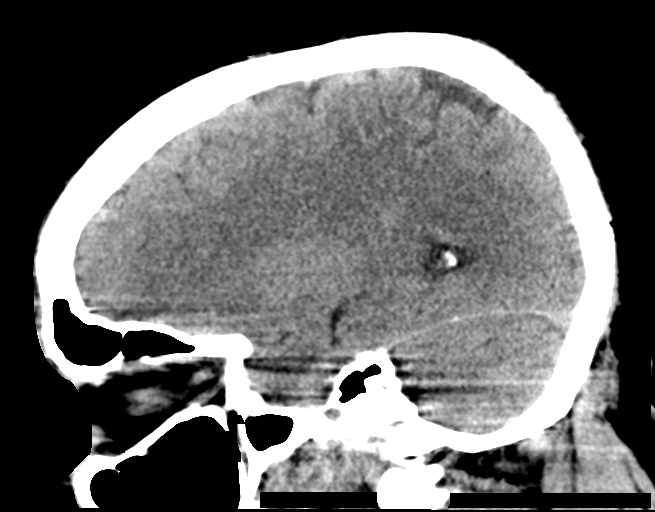

[16 of 47 positions shown; findings below may reference images not displayed]

FINDINGS: Brain: No evidence of acute infarction, hemorrhage, hydrocephalus,
extra-axial collection or mass lesion/mass effect.

Vascular: No hyperdense vessel or unexpected calcification.

Skull: Normal. Negative for fracture or focal lesion.

Sinuses/Orbits: No acute finding.

Other: None.
IMPRESSION: 1. No acute intracranial abnormalities. The appearance of the brain
is normal.

## 2022-12-06 ENCOUNTER — Encounter (HOSPITAL_COMMUNITY): Payer: Self-pay

## 2022-12-06 ENCOUNTER — Other Ambulatory Visit: Payer: Self-pay

## 2022-12-06 ENCOUNTER — Emergency Department (HOSPITAL_COMMUNITY): Payer: Self-pay

## 2022-12-06 ENCOUNTER — Emergency Department (HOSPITAL_COMMUNITY)
Admission: EM | Admit: 2022-12-06 | Discharge: 2022-12-06 | Disposition: A | Discharge status: Home / Self Care | Payer: Self-pay | Attending: Emergency Medicine | Admitting: Emergency Medicine

## 2022-12-06 DIAGNOSIS — X31XXXA Exposure to excessive natural cold, initial encounter: Secondary | ICD-10-CM | POA: Insufficient documentation

## 2022-12-06 DIAGNOSIS — S060XAA Concussion with loss of consciousness status unknown, initial encounter: Secondary | ICD-10-CM | POA: Insufficient documentation

## 2022-12-06 DIAGNOSIS — R799 Abnormal finding of blood chemistry, unspecified: Secondary | ICD-10-CM | POA: Insufficient documentation

## 2022-12-06 DIAGNOSIS — S022XXA Fracture of nasal bones, initial encounter for closed fracture: Secondary | ICD-10-CM | POA: Insufficient documentation

## 2022-12-06 DIAGNOSIS — T68XXXA Hypothermia, initial encounter: Secondary | ICD-10-CM | POA: Insufficient documentation

## 2022-12-06 DIAGNOSIS — Z20822 Contact with and (suspected) exposure to covid-19: Secondary | ICD-10-CM | POA: Insufficient documentation

## 2022-12-06 DIAGNOSIS — S0990XA Unspecified injury of head, initial encounter: Secondary | ICD-10-CM

## 2022-12-06 DIAGNOSIS — R109 Unspecified abdominal pain: Secondary | ICD-10-CM | POA: Insufficient documentation

## 2022-12-06 DIAGNOSIS — R7401 Elevation of levels of liver transaminase levels: Secondary | ICD-10-CM | POA: Insufficient documentation

## 2022-12-06 DIAGNOSIS — W07XXXA Fall from chair, initial encounter: Secondary | ICD-10-CM | POA: Insufficient documentation

## 2022-12-06 LAB — CBC
HCT: 43.4 % (ref 39.0–52.0)
Hemoglobin: 13.7 g/dL (ref 13.0–17.0)
MCH: 25.9 pg — ABNORMAL LOW (ref 26.0–34.0)
MCHC: 31.6 g/dL (ref 30.0–36.0)
MCV: 82.2 fL (ref 80.0–100.0)
Platelets: 164 10*3/uL (ref 150–400)
RBC: 5.28 MIL/uL (ref 4.22–5.81)
RDW: 15.3 % (ref 11.5–15.5)
WBC: 4.4 10*3/uL (ref 4.0–10.5)
nRBC: 0 % (ref 0.0–0.2)

## 2022-12-06 LAB — COMPREHENSIVE METABOLIC PANEL
ALT: 127 U/L — ABNORMAL HIGH (ref 0–44)
AST: 72 U/L — ABNORMAL HIGH (ref 15–41)
Albumin: 3.7 g/dL (ref 3.5–5.0)
Alkaline Phosphatase: 85 U/L (ref 38–126)
Anion gap: 11 (ref 5–15)
BUN: 12 mg/dL (ref 6–20)
CO2: 26 mmol/L (ref 22–32)
Calcium: 9.1 mg/dL (ref 8.9–10.3)
Chloride: 100 mmol/L (ref 98–111)
Creatinine, Ser: 0.8 mg/dL (ref 0.61–1.24)
GFR, Estimated: >60 mL/min (ref >60)
Glucose, Bld: 94 mg/dL (ref 70–99)
Potassium: 3.8 mmol/L (ref 3.5–5.1)
Sodium: 137 mmol/L (ref 135–145)
Total Bilirubin: 0.3 mg/dL (ref 0.3–1.2)
Total Protein: 6.7 g/dL (ref 6.5–8.1)

## 2022-12-06 LAB — BASIC METABOLIC PANEL
Anion gap: 8 (ref 5–15)
BUN: 12 mg/dL (ref 6–20)
CO2: 28 mmol/L (ref 22–32)
Calcium: 9.2 mg/dL (ref 8.9–10.3)
Chloride: 101 mmol/L (ref 98–111)
Creatinine, Ser: 0.77 mg/dL (ref 0.61–1.24)
GFR, Estimated: >60 mL/min (ref >60)
Glucose, Bld: 100 mg/dL — ABNORMAL HIGH (ref 70–99)
Potassium: 4 mmol/L (ref 3.5–5.1)
Sodium: 137 mmol/L (ref 135–145)

## 2022-12-06 LAB — URINALYSIS, ROUTINE W REFLEX MICROSCOPIC
Bacteria, UA: NONE SEEN
Bilirubin Urine: NEGATIVE
Glucose, UA: NEGATIVE mg/dL
Hgb urine dipstick: NEGATIVE
Ketones, ur: NEGATIVE mg/dL
Nitrite: NEGATIVE
Protein, ur: NEGATIVE mg/dL
Specific Gravity, Urine: 1.014 (ref 1.005–1.030)
pH: 5 (ref 5.0–8.0)

## 2022-12-06 LAB — RESP PANEL BY RT-PCR (RSV, FLU A&B, COVID)  RVPGX2
Influenza A by PCR: NEGATIVE
Influenza B by PCR: NEGATIVE
Resp Syncytial Virus by PCR: NEGATIVE
SARS Coronavirus 2 by RT PCR: NEGATIVE

## 2022-12-06 LAB — TSH: TSH: 1.34 u[IU]/mL (ref 0.350–4.500)

## 2022-12-06 LAB — PROTIME-INR
INR: 1 (ref 0.8–1.2)
Prothrombin Time: 13.1 seconds (ref 11.4–15.2)

## 2022-12-06 LAB — APTT: aPTT: 35 seconds (ref 24–36)

## 2022-12-06 LAB — CBG MONITORING, ED: Glucose-Capillary: 106 mg/dL — ABNORMAL HIGH (ref 70–99)

## 2022-12-06 LAB — LACTIC ACID, PLASMA: Lactic Acid, Venous: 1 mmol/L (ref 0.5–1.9)

## 2022-12-06 MED ORDER — LIDOCAINE HCL 2 % IJ SOLN
10.0000 mL | Freq: Once | INTRAMUSCULAR | Status: DC
  Filled 2022-12-06: qty 20

## 2022-12-06 MED ORDER — IOHEXOL 350 MG/ML SOLN
75.0000 mL | Freq: Once | INTRAVENOUS | Status: AC | PRN
  Administered 2022-12-06: 75 mL via INTRAVENOUS

## 2022-12-06 MED ORDER — ACETAMINOPHEN 500 MG PO TABS
1000.0000 mg | ORAL_TABLET | Freq: Once | ORAL | Status: AC
  Administered 2022-12-06: 1000 mg via ORAL
  Filled 2022-12-06: qty 2

## 2022-12-06 NOTE — ED Provider Triage Note (Signed)
Emergency Medicine Provider Triage Evaluation Note  Colton Lee , a 57 y.o. male  was evaluated in triage.  Pt complains of head pain s/p fall a few weeks ago. States he fell asleep while upright and fell forward onto his head and face. Denies LOC. Reports pain has been persistent and he decided to have it evaluated tonight. No anticoagulated.  Review of Systems  Positive: As above Negative: As above  Physical Exam  Ht 6\' 2"  (1.88 m)   Wt 113.4 kg   BMI 32.10 kg/m  Gen:   Awake, no distress   Resp:  Normal effort  MSK:   Moves extremities without difficulty  Other:  No battle's sign or raccoon's eyes. No skull instability or evidence of remote head trauma. Ambulatory with steady gait.   Medical Decision Making  Medically screening exam initiated at 3:23 AM.  Appropriate orders placed.  Colton Lee was informed that the remainder of the evaluation will be completed by another provider, this initial triage assessment does not replace that evaluation, and the importance of remaining in the ED until their evaluation is complete.  Headache - remote hx of fall.   Antonietta Breach, PA-C 12/06/22 [phone]

## 2022-12-06 NOTE — ED Provider Notes (Signed)
[facility] EMERGENCY DEPARTMENT Provider Note   CSN: [id number] Arrival date & time: 12/06/22  4128     History  Chief Complaint  Patient presents with   Fall    Colton Lee is a 57 y.o. male.  The history is provided by the patient and medical records. No language interpreter was used.  Fall     57 year old male significant history of delusional disorder, alcohol use, presenting for evaluation of head injury.  Patient reports 2 weeks ago he was leaning over a chair sleeping lost control fell and struck his head as he fell.  He is unsure if he had any loss of consciousness but he endorsed having pain primarily to the left side of his scalp and face after the fall.  Since then patient also report having bouts of confusion, having trouble ambulating, and still having persistent headache.  He does not endorse any double vision, nausea, vomiting, neck pain, focal numbness or focal weakness.  He tries over-the-counter medication without adequate relief.  He denies any urinary symptoms no chest pain or shortness of breath.  Home Medications Prior to Admission medications   Medication Sig Start Date End Date Taking? Authorizing Provider  gabapentin (NEURONTIN) 300 MG capsule Take 1 capsule (300 mg total) by mouth 2 (two) times daily. Patient not taking: Reported on 08/25/2017 09/20/16   Gilda Crease, MD  naproxen (NAPROSYN) 500 MG tablet Take 1 po BID with food prn pain 01/25/20   Fayrene Helper, PA-C  traMADol (ULTRAM) 50 MG tablet Take 1 tablet (50 mg total) by mouth every 6 (six) hours as needed. Patient not taking: Reported on 08/25/2017 09/20/16   Gilda Crease, MD  cyproheptadine (PERIACTIN) 4 MG tablet Take 1 tablet (4 mg total) by mouth 3 (three) times daily as needed (appetite). Patient not taking: Reported on 01/31/2016 11/16/15 01/25/20  Dione Booze, MD      Allergies    Tetanus toxoid, adsorbed; Aspirin; Pork-derived products; and Tetanus  toxoids    Review of Systems   Review of Systems  All other systems reviewed and are negative.   Physical Exam Updated Vital Signs BP (!) 192/128   Pulse 75   Temp 97.8 F (36.6 C)   Resp 16   Ht 6\' 2"  (1.88 m)   Wt 113.4 kg   SpO2 100%   BMI 32.10 kg/m  Physical Exam Vitals and nursing note reviewed.  Constitutional:      General: He is not in acute distress.    Appearance: He is well-developed.     Comments: Patient sleepy and drowsy but arousable and able to answer question.  He has a mumbled voice, difficult to fully understand.  HENT:     Head: Normocephalic.     Comments: Tenderness to left parietal scalp as well as tenderness noted to left side of face at the zygomatic arch with associated swelling but no laceration noted.  No raccoon's eyes, no Battle sign.  No midface tenderness. Eyes:     Conjunctiva/sclera: Conjunctivae normal.  Neck:     Comments: No midline spine tenderness. Cardiovascular:     Rate and Rhythm: Normal rate and regular rhythm.     Pulses: Normal pulses.     Heart sounds: Normal heart sounds.  Musculoskeletal:     Cervical back: Normal range of motion and neck supple.     Comments: 5 out of 5 strength to all 4 extremities.  Able to ambulate with a slightly unsteady gait.  Skin:    Findings: No rash.  Neurological:     Sensory: No sensory deficit.     Comments: Alert and oriented to self, situation, month, but states that it is 1987 instead of 2024, and thought it was Daisy Floro as president instead of [employer]      ED Results / Procedures / Treatments   Labs (all labs ordered are listed, but only abnormal results are displayed) Labs Reviewed  BASIC METABOLIC PANEL - Abnormal; Notable for the following components:      Result Value   Glucose, Bld 100 (*)    All other components within normal limits  CBC - Abnormal; Notable for the following components:   MCH 25.9 (*)    All other components within normal limits  URINALYSIS,  ROUTINE W REFLEX MICROSCOPIC - Abnormal; Notable for the following components:   Leukocytes,Ua SMALL (*)    All other components within normal limits  COMPREHENSIVE METABOLIC PANEL - Abnormal; Notable for the following components:   AST 72 (*)    ALT 127 (*)    All other components within normal limits  CBG MONITORING, ED - Abnormal; Notable for the following components:   Glucose-Capillary 106 (*)    All other components within normal limits  RESP PANEL BY RT-PCR (RSV, FLU A&B, COVID)  RVPGX2  CULTURE, BLOOD (ROUTINE X 2)  CULTURE, BLOOD (ROUTINE X 2)  URINE CULTURE  LACTIC ACID, PLASMA  TSH  PROTIME-INR  APTT    EKG EKG Interpretation  Date/Time:  Monday December 06 2022 10:08:36 EST Ventricular Rate:  65 PR Interval:  196 QRS Duration: 110 QT Interval:  442 QTC Calculation: 459 R Axis:   16 Text Interpretation: Normal sinus rhythm Nonspecific T wave abnormality Abnormal ECG Nonspecific TW abnormality  new compared to prior Confirmed by Gareth Morgan [phone]) on 12/06/2022 10:38:10 AM  Radiology CT HEAD WO CONTRAST (5MM)  Result Date: 12/06/2022 CLINICAL DATA:  57 year old male status post fall several weeks ago but continued left side head pain. EXAM: CT HEAD WITHOUT CONTRAST TECHNIQUE: Contiguous axial images were obtained from the base of the skull through the vertex without intravenous contrast. RADIATION DOSE REDUCTION: This exam was performed according to the departmental dose-optimization program which includes automated exposure control, adjustment of the mA and/or kV according to patient size and/or use of iterative reconstruction technique. COMPARISON:  Head CT 02/14/2020. FINDINGS: Brain: Cavum septum pellucidum, normal variant. Cerebral volume remains normal for age. No midline shift, ventriculomegaly, mass effect, evidence of mass lesion, intracranial hemorrhage or evidence of cortically based acute infarction. Gray-white matter differentiation is within normal  limits throughout the brain. Vascular: Calcified atherosclerosis at the skull base. No suspicious intracranial vascular hyperdensity. Skull: Stable and intact. Sinuses/Orbits: Visualized paranasal sinuses and mastoids are stable and well aerated. Tympanic cavities are clear. Other: Orbit and scalp soft tissues appears stable, no recent injury identified. A small volume of retained secretions in the nasopharynx is similar to the 2021 CT. IMPRESSION: Stable since 2021 and normal for age non contrast CT appearance of the brain. No recent traumatic injury identified. Electronically Signed   By: Genevie Ann M.D.   On: 12/06/2022 04:48    Procedures .Critical Care  Performed by: Domenic Moras, PA-C Authorized by: Domenic Moras, PA-C   Critical care provider statement:    Critical care time (minutes):  30   Critical care was time spent personally by me on the following activities:  Development of treatment plan with patient or surrogate, discussions  with consultants, evaluation of patient's response to treatment, examination of patient, ordering and review of laboratory studies, ordering and review of radiographic studies, ordering and performing treatments and interventions, pulse oximetry, re-evaluation of patient's condition and review of old charts     Medications Ordered in ED Medications  acetaminophen (TYLENOL) tablet 1,000 mg (1,000 mg Oral Given 12/06/22 0328)  acetaminophen (TYLENOL) tablet 1,000 mg (1,000 mg Oral Given 12/06/22 1153)    ED Course/ Medical Decision Making/ A&P Clinical Course as of 12/06/22 1507  Mon Dec 06, 2022  1459 56yo Fall, homeless potential psych hx. Presented with temp 93.81F via rectal temp.  [ ]  Pending CT abd, temp recheck  [JS]    Clinical Course User Index [JS] , MD                             Medical Decision Making Amount and/or Complexity of Data Reviewed Labs: ordered. Radiology: ordered.  Risk OTC drugs. Prescription drug  management.   BP (!) 218/105 (BP Location: Left Wrist)   Pulse 68   Temp 97.8 F (36.6 C)   Resp 20   Ht 6\' 2"  (1.88 m)   Wt 113.4 kg   SpO2 100%   BMI 32.10 kg/m   12:30 AM 57 year old male significant history of delusional disorder, alcohol use, presenting for evaluation of head injury.  Patient reports 2 weeks ago he was leaning over a chair sleeping lost control fell and struck his head as he fell.  He is unsure if he had any loss of consciousness but he endorsed having pain primarily to the left side of his scalp and face after the fall.  Since then patient also report having bouts of confusion, having trouble ambulating, and still having persistent headache.  He does not endorse any double vision, nausea, vomiting, neck pain, focal numbness or focal weakness.  He tries over-the-counter medication without adequate relief.  He denies any urinary symptoms no chest pain or shortness of breath.  On exam, patient is laying in bed sleeping but arousable.  It is difficult to fully understand him as he has a mumbled voice.  Patient is able to answer question appropriately.  Exam reviewed tenderness to the left parietal and left zygomatic region on palpation.  No swelling noted to the left side of face at the site of tenderness.  He has some difficulty tracking my fingers.  However, extraocular movement is intact and no neglect.  His gait is mildly antalgic.  He does not have any midline spine tenderness.  Does not have any significant signs of other injuries.  Vital signs notable for markedly elevated blood pressure of 218/105.  Patient does not have history of hypertension and is not on any antihypertensive medication.  Initial head CT scan obtain an overall without any acute traumatic injury identified.  Will obtain maxillofacial CT to rule out facial injury.  Labs ordered.  Staff notified pt has a rectal temp of 93.9 and oral temp is undetectable.  Pt placed in a 44 and sepsis work up  initiated  59 ordered, independently viewed and interpreted by me.  Labs remarkable for UA without UTI, normal electrolytes panel, however evidence of transaminitis with AST 72, ALT 127 but normal alk phos of 85.  Pt does endorse some R side abdominal discomfort on exam. I have order abd/pelvis CT for further assessment. Resp panel negative for covid, flu and RSV. Normal lactic acid and  normal WBC. Sepsis protocol cancelled.   -The patient was maintained on a cardiac monitor.  I personally viewed and interpreted the cardiac monitored which showed an underlying rhythm of: NSR -Imaging independently viewed and interpreted by me and I agree with radiologist's interpretation.  Result remarkable for head CT without acute changes.  CT maxillofacial showing healing fracture of the left nasal bones.  Also asymmetric edema and fat stranding with asymmetric prominence of the lymph nodes of the left mandible.  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   -This patient presents to the ED for concern of head injury, this involves an extensive number of treatment options, and is a complaint that carries with it a high risk of complications and morbidity.  The differential diagnosis includes concussion, skull fx, facial fx, spinal injury, infection -Co morbidities that complicate the patient evaluation includes delusional disorder -Treatment includes IV abx, IVF, bair hugger -Reevaluation of the patient after these medicines showed that the patient improved -PCP office notes or outside notes reviewed -Discussion with specialist attending DR. Schlossman -Escalation to admission/observation considered:   2:41 PM Is homeless, he has been outside last night.  His hypothermia may be secondary to environmental changes as he did have improvement of his temperature after being placed on a Bair hugger.  On reassessment, he is mentating more appropriately.  He has negative Kernig signs and I have very low suspicion for  meningitis and low suspicion for HSV encephalitis.  He has had prior visit with abnormal behaviors and therefore is not entirely unlikely that his behavior is at his baseline.  He does endorse some right hip pain and abdominal pain, will obtain further images for further assessment.  Care was discussed with DR. Schlossman.    3:07 PM Pt sign out to oncoming team who will f/u on CT result and reassess.         Final Clinical Impression(s) / ED Diagnoses Final diagnoses:  Injury of head, initial encounter  Concussion with unknown loss of consciousness status, initial encounter  Hypothermia, initial encounter  Closed fracture of nasal bone, initial encounter  Transaminitis    Rx / DC Orders ED Discharge Orders     None         Domenic Moras, PA-C 12/06/22 1508    Gareth Morgan, MD 12/07/22 [phone]

## 2022-12-06 NOTE — ED Triage Notes (Signed)
Pt arrived POV c/o a fall that happened several weeks ago but is c/o his head hurting on the left side of his head.

## 2022-12-06 NOTE — Discharge Instructions (Signed)
You were seen for your hypothermia, while in the ED we completed a full hx and physical exam. Your temperature normalized, Please follow up with your PCP in the next week. Sincerely, Clydie Braun, PGY-2

## 2022-12-06 NOTE — ED Provider Notes (Signed)
  Physical Exam  BP (!) 151/87   Pulse 75   Temp 97.9 F (36.6 C) (Oral)   Resp 18   Ht 6\' 2"  (1.88 m)   Wt 113.4 kg   SpO2 96%   BMI 32.10 kg/m   Physical Exam Vitals and nursing note reviewed.  Constitutional:      General: He is not in acute distress.    Appearance: He is well-developed.  HENT:     Head: Normocephalic and atraumatic.     Mouth/Throat:     Mouth: Mucous membranes are moist.  Eyes:     Conjunctiva/sclera: Conjunctivae normal.  Cardiovascular:     Rate and Rhythm: Normal rate and regular rhythm.     Heart sounds: No murmur heard. Pulmonary:     Effort: Pulmonary effort is normal. No respiratory distress.     Breath sounds: Normal breath sounds.  Abdominal:     Palpations: Abdomen is soft.     Tenderness: There is no abdominal tenderness.  Musculoskeletal:        General: No swelling.     Cervical back: Neck supple.     Right lower leg: Edema present.     Left lower leg: Edema present.  Skin:    General: Skin is warm and dry.     Capillary Refill: Capillary refill takes less than 2 seconds.  Neurological:     Mental Status: He is alert. Mental status is at baseline.  Psychiatric:        Mood and Affect: Mood normal.     Procedures  Procedures  ED Course / MDM   Clinical Course as of 12/06/22 1700  Mon Dec 06, 2022  1459 57yo Fall, homeless potential psych hx. Presented with temp 93.88F via rectal temp.  [ ]  Pending CT abd, temp recheck  [JS]    Clinical Course User Index [JS] Donzetta Matters, MD   Medical Decision Making On reassessment of patient he is at his neurological baseline no focal neurological deficits.  Patient has no acute complaints we reviewed patient's CT abdomen pelvis no evidence of acute abnormalities of the abdomen outside of fat-containing hernia.  Temperature was rechecked resulting in 97 9 given patient's improved clinical status and negative imaging studies patient is suitable for discharge at this time discharged  home self-care  Amount and/or Complexity of Data Reviewed Labs: ordered. Radiology: ordered.  Risk OTC drugs. Prescription drug management.          Donzetta Matters, MD 12/06/22 2218    Davonna Belling, MD 12/07/22 [phone]

## 2022-12-06 NOTE — ED Notes (Signed)
Pt verbalized understanding of discharge paperwork. Pt given bus pass.

## 2022-12-07 ENCOUNTER — Other Ambulatory Visit (HOSPITAL_COMMUNITY): Payer: Self-pay

## 2022-12-07 ENCOUNTER — Encounter (HOSPITAL_COMMUNITY): Payer: Self-pay

## 2022-12-07 ENCOUNTER — Emergency Department (HOSPITAL_COMMUNITY): Payer: Self-pay

## 2022-12-07 ENCOUNTER — Emergency Department (HOSPITAL_COMMUNITY)
Admission: EM | Admit: 2022-12-07 | Discharge: 2022-12-07 | Disposition: A | Discharge status: Home / Self Care | Payer: Self-pay | Attending: Emergency Medicine | Admitting: Emergency Medicine

## 2022-12-07 ENCOUNTER — Other Ambulatory Visit: Payer: Self-pay

## 2022-12-07 DIAGNOSIS — R140 Abdominal distension (gaseous): Secondary | ICD-10-CM | POA: Insufficient documentation

## 2022-12-07 DIAGNOSIS — M79674 Pain in right toe(s): Secondary | ICD-10-CM | POA: Insufficient documentation

## 2022-12-07 DIAGNOSIS — M545 Low back pain, unspecified: Secondary | ICD-10-CM | POA: Insufficient documentation

## 2022-12-07 DIAGNOSIS — W19XXXD Unspecified fall, subsequent encounter: Secondary | ICD-10-CM | POA: Insufficient documentation

## 2022-12-07 LAB — URINE CULTURE: Culture: NO GROWTH

## 2022-12-07 MED ORDER — MUPIROCIN 2 % EX OINT
1.0000 | TOPICAL_OINTMENT | Freq: Two times a day (BID) | CUTANEOUS | 0 refills | Status: AC
  Filled 2022-12-07: qty 22, 11d supply, fill #0

## 2022-12-07 MED ORDER — NAPROXEN 500 MG PO TABS
500.0000 mg | ORAL_TABLET | Freq: Two times a day (BID) | ORAL | 0 refills | Status: AC
  Filled 2022-12-07: qty 10, 5d supply, fill #0

## 2022-12-07 NOTE — Discharge Instructions (Addendum)
Your spine x ray was negative for a fracture It does look like you are starting to get an ulcer in between your toes-I am going to prescribe bactroban ointment to put there twice a day for 5 days I am prescribing naproxen for pain to take twice a day with meals for 5 days

## 2022-12-07 NOTE — ED Notes (Signed)
Patient transported to X-ray 

## 2022-12-07 NOTE — ED Notes (Signed)
Patient medications delivered by [facility] pharmacy.

## 2022-12-07 NOTE — ED Notes (Signed)
Patient returned from X-ray 
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
## 2022-12-07 NOTE — ED Triage Notes (Addendum)
Pt came in POV d/t falling a "couple of weeks ago" but began feeling a "crunch on his back" when he leans forward. Rates his pain 10/10 stabbing pain in his back when he moves. Also reports hitting the left side of his head when he fell & often gets dizzy & lastly reports Rt toe pain.

## 2022-12-07 NOTE — Discharge Planning (Signed)
RNCM consulted regarding uninsured pt requiring discharge Rx.  RNCM suggests sending Rx to [facility] as they will fill and deliver Rx to patient at bedside prior to discharge from hospital.

## 2022-12-10 ENCOUNTER — Emergency Department (HOSPITAL_COMMUNITY)
Admission: EM | Admit: 2022-12-10 | Discharge: 2022-12-10 | Disposition: A | Discharge status: Home / Self Care | Payer: Self-pay | Attending: Emergency Medicine | Admitting: Emergency Medicine

## 2022-12-10 ENCOUNTER — Other Ambulatory Visit: Payer: Self-pay

## 2022-12-10 ENCOUNTER — Encounter (HOSPITAL_COMMUNITY): Payer: Self-pay

## 2022-12-10 DIAGNOSIS — M549 Dorsalgia, unspecified: Secondary | ICD-10-CM | POA: Insufficient documentation

## 2022-12-10 DIAGNOSIS — K429 Umbilical hernia without obstruction or gangrene: Secondary | ICD-10-CM | POA: Insufficient documentation

## 2022-12-10 DIAGNOSIS — W19XXXA Unspecified fall, initial encounter: Secondary | ICD-10-CM | POA: Insufficient documentation

## 2022-12-10 NOTE — ED Triage Notes (Signed)
Pt came in for a fall 3 days ago & stated he is here to have a follow up visit to make sure he is okay d/t him "feeling different." Also reports he has an abscess on his belly button.

## 2022-12-10 NOTE — ED Provider Notes (Signed)
[facility] EMERGENCY DEPARTMENT Provider Note   CSN: [id number] Arrival date & time: 12/10/22  0719     History  Chief Complaint  Patient presents with   Fall    Colton Lee is a 57 y.o. male.  Patient here requesting food, wants to have his bellybutton evaluated.  Denies any chest pain or shortness of breath or nausea or vomiting.  Had a fall a couple days ago and still having some discomfort at times in his back.  Denies any weakness or numbness or new falls.  Denies any alcohol or drug use.  History of mental health issues and homelessness.  Denies any fevers or chills.  States that he did not have anywhere to really sleep last night.  He is feeling tired.  The history is provided by the patient.       Home Medications Prior to Admission medications   Medication Sig Start Date End Date Taking? Authorizing Provider  gabapentin (NEURONTIN) 300 MG capsule Take 1 capsule (300 mg total) by mouth 2 (two) times daily. Patient not taking: Reported on 08/25/2017 09/20/16   Orpah Greek, MD  mupirocin ointment (BACTROBAN) 2 % Apply 1 Application topically 2 (two) times daily for 5 days. 12/07/22 12/18/22  Gerrit Heck, MD  naproxen (NAPROSYN) 500 MG tablet Take 1 tablet (500 mg total) by mouth 2 (two) times daily with a meal for 5 days. 12/07/22 12/12/22  Gerrit Heck, MD  traMADol (ULTRAM) 50 MG tablet Take 1 tablet (50 mg total) by mouth every 6 (six) hours as needed. Patient not taking: Reported on 08/25/2017 09/20/16   Orpah Greek, MD  cyproheptadine (PERIACTIN) 4 MG tablet Take 1 tablet (4 mg total) by mouth 3 (three) times daily as needed (appetite). Patient not taking: Reported on 01/31/2016 03/88/82 8/0/03  Delora Fuel, MD      Allergies    Tetanus toxoid, adsorbed; Aspirin; Pork-derived products; and Tetanus toxoids    Review of Systems   Review of Systems  Physical Exam Updated Vital Signs BP (!) 171/116   Pulse 79    Temp 98.6 F (37 C) (Oral)   Resp 18   SpO2 100%  Physical Exam Vitals and nursing note reviewed.  Constitutional:      General: He is not in acute distress.    Appearance: He is well-developed.  HENT:     Head: Normocephalic and atraumatic.     Mouth/Throat:     Mouth: Mucous membranes are moist.  Eyes:     Extraocular Movements: Extraocular movements intact.     Conjunctiva/sclera: Conjunctivae normal.     Pupils: Pupils are equal, round, and reactive to light.  Cardiovascular:     Rate and Rhythm: Normal rate and regular rhythm.     Pulses: Normal pulses.     Heart sounds: Normal heart sounds. No murmur heard. Pulmonary:     Effort: Pulmonary effort is normal. No respiratory distress.     Breath sounds: Normal breath sounds.  Abdominal:     Palpations: Abdomen is soft.     Tenderness: There is no abdominal tenderness.     Comments: Patient has reproducible umbilical hernia  Musculoskeletal:        General: No swelling.     Cervical back: Neck supple.  Skin:    General: Skin is warm and dry.     Capillary Refill: Capillary refill takes less than 2 seconds.  Neurological:     General: No focal deficit present.  Mental Status: He is alert and oriented to person, place, and time.     Cranial Nerves: No cranial nerve deficit.     Sensory: No sensory deficit.     Motor: No weakness.     Coordination: Coordination normal.     Comments: 5+ out of 5 strength throughout, normal sensation, normal gait  Psychiatric:        Mood and Affect: Mood normal.     ED Results / Procedures / Treatments   Labs (all labs ordered are listed, but only abnormal results are displayed) Labs Reviewed - No data to display  EKG None  Radiology No results found.  Procedures Procedures    Medications Ordered in ED Medications - No data to display  ED Course/ Medical Decision Making/ A&P                             Medical Decision Making  Colton Lee is here for  evaluation of area around his bellybutton.  He is also requesting food.  History of homelessness and psychiatric illness.  He appears well.  Normal vitals.  No fever.  Neurologically he is intact.  He denies SI or HI.  He has a reducible umbilical hernia on exam.  I have no concern for infectious process.  He denies any new falls.  No weakness or numbness.  He is able to eat and drink and ambulate without any issues.  Resources were provided for shelter.  Discharged in good condition.  This chart was dictated using voice recognition software.  Despite best efforts to proofread,  errors can occur which can change the documentation meaning.         Final Clinical Impression(s) / ED Diagnoses Final diagnoses:  Umbilical hernia without obstruction and without gangrene    Rx / DC Orders ED Discharge Orders     None         Lennice Sites, DO 12/10/22 [id number]

## 2022-12-11 LAB — CULTURE, BLOOD (ROUTINE X 2)
Culture: NO GROWTH
Culture: NO GROWTH
Special Requests: ADEQUATE
Special Requests: ADEQUATE

## 2023-01-14 ENCOUNTER — Other Ambulatory Visit: Payer: Self-pay

## 2023-01-14 ENCOUNTER — Emergency Department (HOSPITAL_COMMUNITY)
Admission: EM | Admit: 2023-01-14 | Discharge: 2023-01-14 | Disposition: A | Discharge status: Home / Self Care | Payer: Self-pay | Attending: Emergency Medicine | Admitting: Emergency Medicine

## 2023-01-14 ENCOUNTER — Emergency Department (HOSPITAL_COMMUNITY): Payer: Self-pay

## 2023-01-14 DIAGNOSIS — I319 Disease of pericardium, unspecified: Secondary | ICD-10-CM | POA: Insufficient documentation

## 2023-01-14 DIAGNOSIS — W01198A Fall on same level from slipping, tripping and stumbling with subsequent striking against other object, initial encounter: Secondary | ICD-10-CM | POA: Insufficient documentation

## 2023-01-14 DIAGNOSIS — R531 Weakness: Secondary | ICD-10-CM | POA: Insufficient documentation

## 2023-01-14 DIAGNOSIS — Z20822 Contact with and (suspected) exposure to covid-19: Secondary | ICD-10-CM | POA: Insufficient documentation

## 2023-01-14 DIAGNOSIS — R42 Dizziness and giddiness: Secondary | ICD-10-CM | POA: Insufficient documentation

## 2023-01-14 DIAGNOSIS — I3139 Other pericardial effusion (noninflammatory): Secondary | ICD-10-CM

## 2023-01-14 DIAGNOSIS — W19XXXA Unspecified fall, initial encounter: Secondary | ICD-10-CM

## 2023-01-14 DIAGNOSIS — J0140 Acute pansinusitis, unspecified: Secondary | ICD-10-CM | POA: Insufficient documentation

## 2023-01-14 LAB — CBC WITH DIFFERENTIAL/PLATELET
Abs Immature Granulocytes: 0.01 10*3/uL (ref 0.00–0.07)
Basophils Absolute: 0 10*3/uL (ref 0.0–0.1)
Basophils Relative: 0 %
Eosinophils Absolute: 0.1 10*3/uL (ref 0.0–0.5)
Eosinophils Relative: 3 %
HCT: 39.9 % (ref 39.0–52.0)
Hemoglobin: 12.6 g/dL — ABNORMAL LOW (ref 13.0–17.0)
Immature Granulocytes: 0 %
Lymphocytes Relative: 41 %
Lymphs Abs: 1.2 10*3/uL (ref 0.7–4.0)
MCH: 25.8 pg — ABNORMAL LOW (ref 26.0–34.0)
MCHC: 31.6 g/dL (ref 30.0–36.0)
MCV: 81.8 fL (ref 80.0–100.0)
Monocytes Absolute: 0.2 10*3/uL (ref 0.1–1.0)
Monocytes Relative: 8 %
Neutro Abs: 1.4 10*3/uL — ABNORMAL LOW (ref 1.7–7.7)
Neutrophils Relative %: 48 %
Platelets: 103 10*3/uL — ABNORMAL LOW (ref 150–400)
RBC: 4.88 MIL/uL (ref 4.22–5.81)
RDW: 16.5 % — ABNORMAL HIGH (ref 11.5–15.5)
WBC: 2.9 10*3/uL — ABNORMAL LOW (ref 4.0–10.5)
nRBC: 0 % (ref 0.0–0.2)

## 2023-01-14 LAB — TSH: TSH: 1.569 u[IU]/mL (ref 0.350–4.500)

## 2023-01-14 LAB — BASIC METABOLIC PANEL
Anion gap: 10 (ref 5–15)
BUN: 24 mg/dL — ABNORMAL HIGH (ref 6–20)
CO2: 25 mmol/L (ref 22–32)
Calcium: 9.3 mg/dL (ref 8.9–10.3)
Chloride: 103 mmol/L (ref 98–111)
Creatinine, Ser: 0.87 mg/dL (ref 0.61–1.24)
GFR, Estimated: >60 mL/min (ref >60)
Glucose, Bld: 79 mg/dL (ref 70–99)
Potassium: 4 mmol/L (ref 3.5–5.1)
Sodium: 138 mmol/L (ref 135–145)

## 2023-01-14 LAB — RESP PANEL BY RT-PCR (RSV, FLU A&B, COVID)  RVPGX2
Influenza A by PCR: NEGATIVE
Influenza B by PCR: NEGATIVE
Resp Syncytial Virus by PCR: NEGATIVE
SARS Coronavirus 2 by RT PCR: NEGATIVE

## 2023-01-14 LAB — T4, FREE: Free T4: 0.9 ng/dL (ref 0.61–1.12)

## 2023-01-14 MED ORDER — AMOXICILLIN-POT CLAVULANATE 875-125 MG PO TABS: 1.0000 | ORAL_TABLET | Freq: Two times a day (BID) | ORAL | 0 refills | Status: AC

## 2023-01-14 NOTE — ED Notes (Signed)
PA Gowens aware patient's rectal temp is 93.5

## 2023-01-14 NOTE — ED Notes (Signed)
Per PA pt is stable to be discharge even with Temp of 21F. Pt a/ox4. Pt ambulatory.

## 2023-01-14 NOTE — Discharge Instructions (Addendum)
Thank you for letting us take care of you today.  Overall, your workup was reassuring. You do have some sinus disease on your CT scan so with your recent symptoms I am prescribing antibiotics to treat a sinus infection. Please take these as prescribed.  On your chest x-ray, there was some concern for a condition called a pericardial effusion. We did not see this on the ultrasound performed in the emergency department and with your otherwise reassuring physical exam and workup, we believe it is safe to discharge you home at this time, however, we recommend you follow up in the office with cardiology for further evaluation. I have placed a referral to cardiology and provided you with their contact information.  Please also schedule a follow up appointment with your primary care provider. If you do not have a primary care provider, please contact one of the clinics provided or a PCP of your own choosing to schedule PCP follow up.  If you develop worsening or new symptoms such as chest pain, shortness of breath, or other new concerns, please be re-evaluated in nearest emergency department.

## 2023-01-14 NOTE — ED Notes (Signed)
Put bair hugger on pt

## 2023-01-14 NOTE — ED Notes (Addendum)
Dr Tomi Bamberger aware patient rectal temp is 93.1. Place bair hugger on patient.

## 2023-01-14 NOTE — ED Triage Notes (Signed)
Pt brought by EMS [facility] complaining of generalized fatigue and joint pain that he says is related to his gout. Pt is homeless. EMS reports that pt was alert and oriented to person and place per baseline.

## 2023-01-14 NOTE — ED Provider Notes (Signed)
[facility] Provider Note   CSN: [id number] Arrival date & time: 01/14/23  [id number]     History  Chief Complaint  Patient presents with   Fatigue    Colton Lee is a 57 y.o. male who presents to the ED complaining of generalized weakness and dizziness for the last 2 to 3 weeks.  He states that the dizziness makes him feel like he is off balance when he walks in 2 to 3 weeks ago he had an episode that caused him to fall down and hit his face.  He denies being evaluated at the time of the fall.  Recent cough and congestion as well.  He denies fever, chills, chest pain, shortness of breath, abdominal pain, nausea, vomiting, or diarrhea. He states he has been eating and drinking well. He denies alcohol or illicit substance use. No recent travel.     Home Medications Prior to Admission medications   Medication Sig Start Date End Date Taking? Authorizing Provider  amoxicillin-clavulanate (AUGMENTIN) 875-125 MG tablet Take 1 tablet by mouth every 12 (twelve) hours for 10 days. 01/14/23 01/24/23 Yes Dniya Neuhaus L, PA-C  gabapentin (NEURONTIN) 300 MG capsule Take 1 capsule (300 mg total) by mouth 2 (two) times daily. Patient not taking: Reported on 08/25/2017 09/20/16   Orpah Greek, MD  traMADol (ULTRAM) 50 MG tablet Take 1 tablet (50 mg total) by mouth every 6 (six) hours as needed. Patient not taking: Reported on 08/25/2017 09/20/16   Orpah Greek, MD  cyproheptadine (PERIACTIN) 4 MG tablet Take 1 tablet (4 mg total) by mouth 3 (three) times daily as needed (appetite). Patient not taking: Reported on 01/31/2016 0000000 [id number]  Delora Fuel, MD      Allergies    Tetanus toxoid, adsorbed; Aspirin; Pork-derived products; and Tetanus toxoids    Review of Systems   Review of Systems  All other systems reviewed and are negative.   Physical Exam Updated Vital Signs BP (!) 162/111   Pulse 61   Temp (!) 93.5 F (34.2 C)  (Rectal)   Resp 18   Ht '6\' 2"'$  (1.88 m)   Wt 116 kg   SpO2 100%   BMI 32.83 kg/m  Physical Exam Vitals and nursing note reviewed.  Constitutional:      General: He is not in acute distress.    Appearance: Normal appearance. He is not ill-appearing, toxic-appearing or diaphoretic.  HENT:     Head: Normocephalic and atraumatic.     Mouth/Throat:     Mouth: Mucous membranes are moist.  Eyes:     Extraocular Movements: Extraocular movements intact.     Conjunctiva/sclera: Conjunctivae normal.     Pupils: Pupils are equal, round, and reactive to light.  Cardiovascular:     Rate and Rhythm: Normal rate and regular rhythm.     Heart sounds: No murmur heard. Pulmonary:     Effort: Pulmonary effort is normal.     Breath sounds: Normal breath sounds.  Abdominal:     General: Abdomen is flat.     Palpations: Abdomen is soft.     Tenderness: There is no abdominal tenderness. There is no guarding or rebound.  Musculoskeletal:        General: Normal range of motion.     Cervical back: Normal range of motion and neck supple. No rigidity.     Right lower leg: No edema.     Left lower leg: No edema.  Skin:  General: Skin is warm and dry.     Capillary Refill: Capillary refill takes less than 2 seconds.  Neurological:     General: No focal deficit present.     Mental Status: He is alert and oriented to person, place, and time.     GCS: GCS eye subscore is 4. GCS verbal subscore is 5. GCS motor subscore is 6.     Cranial Nerves: Cranial nerves 2-12 are intact.     Motor: Motor function is intact. No weakness, tremor, atrophy or abnormal muscle tone.     Coordination: Coordination is intact.     Comments: 5/5 strength to bilateral UE and LE, equal and symmetric, moving in bed and able to change positions with ease  Psychiatric:        Mood and Affect: Mood normal.        Behavior: Behavior normal.     ED Results / Procedures / Treatments   Labs (all labs ordered are listed, but  only abnormal results are displayed) Labs Reviewed  CBC WITH DIFFERENTIAL/PLATELET - Abnormal; Notable for the following components:      Result Value   WBC 2.9 (*)    Hemoglobin 12.6 (*)    MCH 25.8 (*)    RDW 16.5 (*)    Platelets 103 (*)    Neutro Abs 1.4 (*)    All other components within normal limits  BASIC METABOLIC PANEL - Abnormal; Notable for the following components:   BUN 24 (*)    All other components within normal limits  RESP PANEL BY RT-PCR (RSV, FLU A&B, COVID)  [id number]  TSH  T4, FREE    EKG Sinus rhythm Prolonged PR interval Abnormal T, consider ischemia, lateral leads No significant change since last tracing Confirmed by Dorie Rank [id number]) on 01/14/2023 10:40:19 AM  Radiology CT Head Wo Contrast  Result Date: 01/14/2023 CLINICAL DATA:  Provided history: Facial trauma, blunt. Weakness. Dizziness. Head trauma, moderate/severe. EXAM: CT HEAD WITHOUT CONTRAST CT MAXILLOFACIAL WITHOUT CONTRAST TECHNIQUE: Multidetector CT imaging of the head and maxillofacial structures were performed using the standard protocol without intravenous contrast. Multiplanar CT image reconstructions of the maxillofacial structures were also generated. RADIATION DOSE REDUCTION: This exam was performed according to the departmental dose-optimization program which includes automated exposure control, adjustment of the mA and/or kV according to patient size and/or use of iterative reconstruction technique. COMPARISON:  Prior head CT examinations 12/06/2022 and earlier. Maxillofacial CT 12/06/2022. FINDINGS: CT HEAD FINDINGS Brain: No age advanced or lobar predominant parenchymal atrophy. There is no acute intracranial hemorrhage. No demarcated cortical infarct. No extra-axial fluid collection. No evidence of an intracranial mass. No midline shift. Vascular: No hyperdense vessel. Atherosclerotic calcifications. Skull: No fracture or aggressive osseous lesion. CT MAXILLOFACIAL FINDINGS Mildly motion  degraded exam. Osseous: No acute maxillofacial fracture is identified. Orbits: No orbital mass or acute orbital finding. Sinuses: Small osteoma within the left frontal sinus. Trace mucosal thickening within the inferior frontal sinuses, bilaterally. Mild mucosal thickening, and small volume secretions, scattered within right ethmoid air cells. 18 mm mucous retention cysts, and mild background mucosal thickening, within the right maxillary sinus. This may reflect odontogenic sinusitis as there is periapical lucency surrounding the right maxillary first molar tooth. Mild mucosal thickening within the inferior left maxillary sinus. This may also reflect odontogenic sinusitis as there is periapical lucency surrounding the left maxillary first molar tooth. Soft tissues: No definite maxillofacial hematoma is appreciable by CT. Other: Poor dentition with multiple carious teeth and multifocal  periapical lucency. IMPRESSION: CT head: No evidence of an acute intracranial abnormality. CT maxillofacial: 1. Mildly motion degraded exam. 2. No evidence of acute maxillofacial fracture. 3. Paranasal sinus disease, as described. Disease within the bilateral maxillary sinuses may reflect odontogenic sinusitis given periapical lucency surrounding adjacent maxillary molar teeth. Electronically Signed   By: Kellie Simmering D.O.   On: 01/14/2023 08:20   CT Maxillofacial Wo Contrast  Result Date: 01/14/2023 CLINICAL DATA:  Provided history: Facial trauma, blunt. Weakness. Dizziness. Head trauma, moderate/severe. EXAM: CT HEAD WITHOUT CONTRAST CT MAXILLOFACIAL WITHOUT CONTRAST TECHNIQUE: Multidetector CT imaging of the head and maxillofacial structures were performed using the standard protocol without intravenous contrast. Multiplanar CT image reconstructions of the maxillofacial structures were also generated. RADIATION DOSE REDUCTION: This exam was performed according to the departmental dose-optimization program which includes  automated exposure control, adjustment of the mA and/or kV according to patient size and/or use of iterative reconstruction technique. COMPARISON:  Prior head CT examinations 12/06/2022 and earlier. Maxillofacial CT 12/06/2022. FINDINGS: CT HEAD FINDINGS Brain: No age advanced or lobar predominant parenchymal atrophy. There is no acute intracranial hemorrhage. No demarcated cortical infarct. No extra-axial fluid collection. No evidence of an intracranial mass. No midline shift. Vascular: No hyperdense vessel. Atherosclerotic calcifications. Skull: No fracture or aggressive osseous lesion. CT MAXILLOFACIAL FINDINGS Mildly motion degraded exam. Osseous: No acute maxillofacial fracture is identified. Orbits: No orbital mass or acute orbital finding. Sinuses: Small osteoma within the left frontal sinus. Trace mucosal thickening within the inferior frontal sinuses, bilaterally. Mild mucosal thickening, and small volume secretions, scattered within right ethmoid air cells. 18 mm mucous retention cysts, and mild background mucosal thickening, within the right maxillary sinus. This may reflect odontogenic sinusitis as there is periapical lucency surrounding the right maxillary first molar tooth. Mild mucosal thickening within the inferior left maxillary sinus. This may also reflect odontogenic sinusitis as there is periapical lucency surrounding the left maxillary first molar tooth. Soft tissues: No definite maxillofacial hematoma is appreciable by CT. Other: Poor dentition with multiple carious teeth and multifocal periapical lucency. IMPRESSION: CT head: No evidence of an acute intracranial abnormality. CT maxillofacial: 1. Mildly motion degraded exam. 2. No evidence of acute maxillofacial fracture. 3. Paranasal sinus disease, as described. Disease within the bilateral maxillary sinuses may reflect odontogenic sinusitis given periapical lucency surrounding adjacent maxillary molar teeth. Electronically Signed   By: Kellie Simmering D.O.   On: 01/14/2023 08:20   DG Chest 2 View  Result Date: 01/14/2023 CLINICAL DATA:  57 year old male with cough and weakness. Joint pain. EXAM: CHEST - 2 VIEW COMPARISON:  Portable chest 12/06/2022 and earlier. FINDINGS: Semi upright AP and lateral views of the chest at 0712 hours. Cardiomegaly appears progressed since last month. Other mediastinal contours remain normal. No superimposed pneumothorax, pulmonary edema, pleural effusion or confluent pulmonary opacity. Visualized tracheal air column is within normal limits. No acute osseous abnormality identified. Negative visible bowel gas. IMPRESSION: 1. Cardiac size appears increased since last month, consider pericardial effusion. 2. No other acute cardiopulmonary abnormality. Electronically Signed   By: Genevie Ann M.D.   On: 01/14/2023 07:31    Procedures Procedures    Medications Ordered in ED Medications - No data to display  ED Course/ Medical Decision Making/ A&P                             Medical Decision Making Amount and/or Complexity of Data Reviewed Labs: ordered. Decision-making details  documented in ED Course. Radiology: ordered. Decision-making details documented in ED Course. ECG/medicine tests: ordered. Decision-making details documented in ED Course.  Risk Prescription drug management.    Medical Decision Making:   Tauren Singleton Polek is a 57 y.o. male who presented to the ED today with weakness and dizziness detailed above.    Patient placed on continuous vitals and telemetry monitoring while in ED which was reviewed periodically.  Complete initial physical exam performed, notably the patient  was with a reassuring exam including unremarkable neurological exam.    Reviewed and confirmed nursing documentation for past medical history, family history, social history.    Initial Assessment:   With the patient's presentation of weakness, most likely diagnosis is viral infection. Other diagnoses were considered  including (but not limited to) electrolyte disturbance, acute kidney injury, pneumonia, anemia, ICH, fracture. These are considered less likely due to history of present illness and physical exam findings.   This is most consistent with an acute complicated illness  Initial Plan:  Screening labs including CBC and Metabolic panel to evaluate for infectious or metabolic etiology of disease.  CXR to evaluate for structural/infectious intrathoracic pathology.  EKG to evaluate for cardiac pathology Viral swabs CT brain and maxillofacial TFTs Bedside ultrasound by Dr. Tomi Bamberger with no significant pericardial effusion Objective evaluation as reviewed   Initial Study Results:   Laboratory  All laboratory results reviewed without evidence of clinically relevant pathology.   Exceptions include: WBC 2.9, BUN 24   EKG EKG was reviewed independently. ST segments without concerns for acute elevations.    Radiology:  All images reviewed independently. Agree with radiology report at this time.   CT Head Wo Contrast  Result Date: 01/14/2023 CLINICAL DATA:  Provided history: Facial trauma, blunt. Weakness. Dizziness. Head trauma, moderate/severe. EXAM: CT HEAD WITHOUT CONTRAST CT MAXILLOFACIAL WITHOUT CONTRAST TECHNIQUE: Multidetector CT imaging of the head and maxillofacial structures were performed using the standard protocol without intravenous contrast. Multiplanar CT image reconstructions of the maxillofacial structures were also generated. RADIATION DOSE REDUCTION: This exam was performed according to the departmental dose-optimization program which includes automated exposure control, adjustment of the mA and/or kV according to patient size and/or use of iterative reconstruction technique. COMPARISON:  Prior head CT examinations 12/06/2022 and earlier. Maxillofacial CT 12/06/2022. FINDINGS: CT HEAD FINDINGS Brain: No age advanced or lobar predominant parenchymal atrophy. There is no acute intracranial  hemorrhage. No demarcated cortical infarct. No extra-axial fluid collection. No evidence of an intracranial mass. No midline shift. Vascular: No hyperdense vessel. Atherosclerotic calcifications. Skull: No fracture or aggressive osseous lesion. CT MAXILLOFACIAL FINDINGS Mildly motion degraded exam. Osseous: No acute maxillofacial fracture is identified. Orbits: No orbital mass or acute orbital finding. Sinuses: Small osteoma within the left frontal sinus. Trace mucosal thickening within the inferior frontal sinuses, bilaterally. Mild mucosal thickening, and small volume secretions, scattered within right ethmoid air cells. 18 mm mucous retention cysts, and mild background mucosal thickening, within the right maxillary sinus. This may reflect odontogenic sinusitis as there is periapical lucency surrounding the right maxillary first molar tooth. Mild mucosal thickening within the inferior left maxillary sinus. This may also reflect odontogenic sinusitis as there is periapical lucency surrounding the left maxillary first molar tooth. Soft tissues: No definite maxillofacial hematoma is appreciable by CT. Other: Poor dentition with multiple carious teeth and multifocal periapical lucency. IMPRESSION: CT head: No evidence of an acute intracranial abnormality. CT maxillofacial: 1. Mildly motion degraded exam. 2. No evidence of acute maxillofacial fracture. 3. Paranasal  sinus disease, as described. Disease within the bilateral maxillary sinuses may reflect odontogenic sinusitis given periapical lucency surrounding adjacent maxillary molar teeth. Electronically Signed   By: Kellie Simmering D.O.   On: 01/14/2023 08:20   CT Maxillofacial Wo Contrast  Result Date: 01/14/2023 CLINICAL DATA:  Provided history: Facial trauma, blunt. Weakness. Dizziness. Head trauma, moderate/severe. EXAM: CT HEAD WITHOUT CONTRAST CT MAXILLOFACIAL WITHOUT CONTRAST TECHNIQUE: Multidetector CT imaging of the head and maxillofacial structures were  performed using the standard protocol without intravenous contrast. Multiplanar CT image reconstructions of the maxillofacial structures were also generated. RADIATION DOSE REDUCTION: This exam was performed according to the departmental dose-optimization program which includes automated exposure control, adjustment of the mA and/or kV according to patient size and/or use of iterative reconstruction technique. COMPARISON:  Prior head CT examinations 12/06/2022 and earlier. Maxillofacial CT 12/06/2022. FINDINGS: CT HEAD FINDINGS Brain: No age advanced or lobar predominant parenchymal atrophy. There is no acute intracranial hemorrhage. No demarcated cortical infarct. No extra-axial fluid collection. No evidence of an intracranial mass. No midline shift. Vascular: No hyperdense vessel. Atherosclerotic calcifications. Skull: No fracture or aggressive osseous lesion. CT MAXILLOFACIAL FINDINGS Mildly motion degraded exam. Osseous: No acute maxillofacial fracture is identified. Orbits: No orbital mass or acute orbital finding. Sinuses: Small osteoma within the left frontal sinus. Trace mucosal thickening within the inferior frontal sinuses, bilaterally. Mild mucosal thickening, and small volume secretions, scattered within right ethmoid air cells. 18 mm mucous retention cysts, and mild background mucosal thickening, within the right maxillary sinus. This may reflect odontogenic sinusitis as there is periapical lucency surrounding the right maxillary first molar tooth. Mild mucosal thickening within the inferior left maxillary sinus. This may also reflect odontogenic sinusitis as there is periapical lucency surrounding the left maxillary first molar tooth. Soft tissues: No definite maxillofacial hematoma is appreciable by CT. Other: Poor dentition with multiple carious teeth and multifocal periapical lucency. IMPRESSION: CT head: No evidence of an acute intracranial abnormality. CT maxillofacial: 1. Mildly motion degraded  exam. 2. No evidence of acute maxillofacial fracture. 3. Paranasal sinus disease, as described. Disease within the bilateral maxillary sinuses may reflect odontogenic sinusitis given periapical lucency surrounding adjacent maxillary molar teeth. Electronically Signed   By: Kellie Simmering D.O.   On: 01/14/2023 08:20   DG Chest 2 View  Result Date: 01/14/2023 CLINICAL DATA:  57 year old male with cough and weakness. Joint pain. EXAM: CHEST - 2 VIEW COMPARISON:  Portable chest 12/06/2022 and earlier. FINDINGS: Semi upright AP and lateral views of the chest at 0712 hours. Cardiomegaly appears progressed since last month. Other mediastinal contours remain normal. No superimposed pneumothorax, pulmonary edema, pleural effusion or confluent pulmonary opacity. Visualized tracheal air column is within normal limits. No acute osseous abnormality identified. Negative visible bowel gas. IMPRESSION: 1. Cardiac size appears increased since last month, consider pericardial effusion. 2. No other acute cardiopulmonary abnormality. Electronically Signed   By: Genevie Ann M.D.   On: 01/14/2023 07:31     Final Assessment and Plan:   This is a 57 year old male presenting to the ED for evaluation of generalized weakness and dizziness for the last couple of weeks.  He reports a couple weeks ago he had a fall where he injured his face and has not been evaluated for that.  On my examination, he is neurologically intact with lungs clear to auscultation and benign abdominal exam. No significant metabolic disturbance on labs and CBC stable compared to previous. Viral swabs negative. No acute fracture or ICH on CT  brain/maxillofacial but he does have some sinus disease and reports recent cough. With this, will discharge home on antibiotics to treat acute sinusitis. Questionable pericardial effusion on chest x-ray. Discussed with attending who did bedside ultrasound that showed no significant effusion. Cardiology referral placed so pt can  follow up in outpatient setting. Pt without complaints of chest pain or shortness of breath. No significant findings on EKG. On vital sign reassessment by nursing staff, they report rectal temperature of 93.5 F. Pt not tachycardic, tachypneic, and with no leukocytosis so low suspicion for sepsis. He reports that he is homeless and has been staying outside in the cold most of the night. He remains neurologically intact, appropriate, tolerating PO intake, and without any new complaints. TFTs added on for further assessment and pt repeatedly re-examined without changes in mental status or other complaints apart from those mentioned above. He is tolerating PO, in no acute distress, and ambulating with ease. Case discussed multiple times with attending MD and with TFTs that are unremarkable and no other significant findings on workup or physical exam, believe it is safe to discharge pt at this time. Pt aware of findings and discharge plan including antibiotics and strict ED return precautions. Given multiple resources for shelters and financial assistance. Pt expressed understanding of plan, all questions answered, and stable for discharge.    Clinical Impression:  1. Pericardial effusion   2. Weakness   3. Dizziness   4. Fall, initial encounter   5. Acute non-recurrent pansinusitis           Final Clinical Impression(s) / ED Diagnoses Final diagnoses:  Weakness  Dizziness  Fall, initial encounter  Acute non-recurrent pansinusitis  Pericardial effusion    Rx / DC Orders ED Discharge Orders          Ordered    amoxicillin-clavulanate (AUGMENTIN) 875-125 MG tablet  Every 12 hours        01/14/23 1022    Ambulatory referral to Cardiology       Comments: If you have not heard from the Cardiology office within the next 72 hours please call [phone].   01/14/23 1025              Turner Daniels 01/14/23 1617    Dorie Rank, MD 01/16/23 [phone]

## 2023-01-14 NOTE — ED Notes (Signed)
PA Gowens and Dr Tomi Bamberger told us to discontinue the [employer] and ambulate patient

## 2023-01-14 NOTE — ED Notes (Signed)
Provided pt with a ham sandwich and coffee

## 2023-01-14 NOTE — ED Provider Notes (Signed)
Ultrasound ED Echo  Date/Time: 01/14/2023 10:13 AM  Performed by: Dorie Rank, MD Authorized by: Dorie Rank, MD   Procedure details:    Indications comment:  Abnormal cxr   Views: subxiphoid     Images: archived   Findings:    Pericardium: no pericardial effusion   Impression:    Impression: normal    Limited assessment for possible pericardial effusion, no charge.  US performed to evaluate for possible pericardial effusion. No large effusion noted on bedside echo.  Stable for outpatient follow up.   Dorie Rank, MD 01/14/23 1014

## 2023-01-18 ENCOUNTER — Emergency Department (HOSPITAL_COMMUNITY): Payer: Self-pay

## 2023-01-18 ENCOUNTER — Other Ambulatory Visit: Payer: Self-pay

## 2023-01-18 ENCOUNTER — Encounter (HOSPITAL_COMMUNITY): Payer: Self-pay | Admitting: Emergency Medicine

## 2023-01-18 ENCOUNTER — Emergency Department (HOSPITAL_COMMUNITY)
Admission: EM | Admit: 2023-01-18 | Discharge: 2023-01-18 | Disposition: A | Discharge status: Home / Self Care | Payer: Self-pay | Attending: Emergency Medicine | Admitting: Emergency Medicine

## 2023-01-18 DIAGNOSIS — G8929 Other chronic pain: Secondary | ICD-10-CM | POA: Insufficient documentation

## 2023-01-18 DIAGNOSIS — M79605 Pain in left leg: Secondary | ICD-10-CM | POA: Insufficient documentation

## 2023-01-18 DIAGNOSIS — M79604 Pain in right leg: Secondary | ICD-10-CM | POA: Insufficient documentation

## 2023-01-18 DIAGNOSIS — R42 Dizziness and giddiness: Secondary | ICD-10-CM | POA: Insufficient documentation

## 2023-01-18 LAB — COMPREHENSIVE METABOLIC PANEL
ALT: 80 U/L — ABNORMAL HIGH (ref 0–44)
AST: 46 U/L — ABNORMAL HIGH (ref 15–41)
Albumin: 3.6 g/dL (ref 3.5–5.0)
Alkaline Phosphatase: 106 U/L (ref 38–126)
Anion gap: 6 (ref 5–15)
BUN: 16 mg/dL (ref 6–20)
CO2: 29 mmol/L (ref 22–32)
Calcium: 9.1 mg/dL (ref 8.9–10.3)
Chloride: 104 mmol/L (ref 98–111)
Creatinine, Ser: 0.93 mg/dL (ref 0.61–1.24)
GFR, Estimated: >60 mL/min (ref >60)
Glucose, Bld: 85 mg/dL (ref 70–99)
Potassium: 3.9 mmol/L (ref 3.5–5.1)
Sodium: 139 mmol/L (ref 135–145)
Total Bilirubin: 0.5 mg/dL (ref 0.3–1.2)
Total Protein: 7.1 g/dL (ref 6.5–8.1)

## 2023-01-18 LAB — CBC
HCT: 41.9 % (ref 39.0–52.0)
Hemoglobin: 13.3 g/dL (ref 13.0–17.0)
MCH: 25.8 pg — ABNORMAL LOW (ref 26.0–34.0)
MCHC: 31.7 g/dL (ref 30.0–36.0)
MCV: 81.2 fL (ref 80.0–100.0)
Platelets: 118 10*3/uL — ABNORMAL LOW (ref 150–400)
RBC: 5.16 MIL/uL (ref 4.22–5.81)
RDW: 16.3 % — ABNORMAL HIGH (ref 11.5–15.5)
WBC: 3.1 10*3/uL — ABNORMAL LOW (ref 4.0–10.5)
nRBC: 0 % (ref 0.0–0.2)

## 2023-01-18 LAB — TROPONIN I (HIGH SENSITIVITY): Troponin I (High Sensitivity): 15 ng/L (ref <18)

## 2023-01-18 MED ORDER — NAPROXEN 250 MG PO TABS
500.0000 mg | ORAL_TABLET | Freq: Once | ORAL | Status: AC
  Administered 2023-01-18: 500 mg via ORAL
  Filled 2023-01-18: qty 2

## 2023-01-18 NOTE — Discharge Instructions (Addendum)
You were seen in the ER for knee and leg pain.  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 We normally treat this with over the counter medicines such as ibuprofen, tylenol, and/or naproxen.   Continue to monitor how you're doing and return to the ER for new or worsening symptoms.

## 2023-01-18 NOTE — ED Triage Notes (Addendum)
Pt states he can't walk from the knee pain, "my arthritis is bad."  He also c/o of ongoing dizziness, was seen last week for same.  Pt reports no changed just has not gotten any better.  He states that he is doing a lot of walking and the pain is causing him to fall over.  Pt clarified, he is only falling over b/c of the pain from walking not b/c he is dizzy.  Pt verified he has not hit his head since the last hospital visit.  Pt is requesting a wheelchair

## 2023-01-18 NOTE — ED Provider Notes (Signed)
[facility] Provider Note   CSN: [id number] Arrival date & time: 01/18/23  1937     History  Chief Complaint  Patient presents with   Knee Pain    Colton Lee is a 57 y.o. male with history of delusional disorder, adjustment disorder who presents to the ER complaining of bilateral knee pain. States that his knees and feet have been hurting for some time, reports having to walk a lot. Complains of a fall from several weeks ago from his leg pain. Had some dizziness the other day and was seen in the ER, reports it feels about the same.    Knee Pain      Home Medications Prior to Admission medications   Medication Sig Start Date End Date Taking? Authorizing Provider  amoxicillin-clavulanate (AUGMENTIN) 875-125 MG tablet Take 1 tablet by mouth every 12 (twelve) hours for 10 days. 01/14/23 01/24/23  Gowens, Mariah L, PA-C  gabapentin (NEURONTIN) 300 MG capsule Take 1 capsule (300 mg total) by mouth 2 (two) times daily. Patient not taking: Reported on 08/25/2017 09/20/16   Orpah Greek, MD  traMADol (ULTRAM) 50 MG tablet Take 1 tablet (50 mg total) by mouth every 6 (six) hours as needed. Patient not taking: Reported on 08/25/2017 09/20/16   Orpah Greek, MD  cyproheptadine (PERIACTIN) 4 MG tablet Take 1 tablet (4 mg total) by mouth 3 (three) times daily as needed (appetite). Patient not taking: Reported on 01/31/2016 0000000 [id number]  Delora Fuel, MD      Allergies    Tetanus toxoid, adsorbed; Aspirin; Pork-derived products; and Tetanus toxoids    Review of Systems   Review of Systems  Physical Exam Updated Vital Signs BP (!) 144/103 (BP Location: Right Arm)   Pulse 65   Temp 99 F (37.2 C)   Resp 18   Ht '6\' 2"'$  (1.88 m)   Wt 116 kg   SpO2 100%   BMI 32.83 kg/m  Physical Exam Vitals and nursing note reviewed.  Constitutional:      Appearance: Normal appearance.  HENT:     Head: Normocephalic and  atraumatic.  Eyes:     Conjunctiva/sclera: Conjunctivae normal.  Pulmonary:     Effort: Pulmonary effort is normal. No respiratory distress.  Musculoskeletal:     Right lower leg: No edema.     Left lower leg: No edema.     Comments: No bony tenderness, effusions, or increased warmth noted to the bilateral knees. Normal ROM of bilateral lower extremities.  Skin:    General: Skin is warm and dry.  Neurological:     Mental Status: He is alert.     Comments: Sensation intact in all extremities.  Psychiatric:        Mood and Affect: Mood normal.        Behavior: Behavior normal.     ED Results / Procedures / Treatments   Labs (all labs ordered are listed, but only abnormal results are displayed) Labs Reviewed  COMPREHENSIVE METABOLIC PANEL - Abnormal; Notable for the following components:      Result Value   AST 46 (*)    ALT 80 (*)    All other components within normal limits  CBC - Abnormal; Notable for the following components:   WBC 3.1 (*)    MCH 25.8 (*)    RDW 16.3 (*)    Platelets 118 (*)    All other components within normal limits  TROPONIN I (HIGH  SENSITIVITY)    EKG None  Radiology DG Knee 2 Views Left  Result Date: 01/18/2023 CLINICAL DATA:  Left knee pain. EXAM: LEFT KNEE - 1-2 VIEW COMPARISON:  None Available. FINDINGS: No evidence of fracture, dislocation, or joint effusion. Chronic changes are seen along the dorsal and ventral aspects of the left patella. There is mild patellofemoral, medial tibiofemoral and lateral tibiofemoral compartment space narrowing. Soft tissues are unremarkable. IMPRESSION: Mild tricompartmental degenerative changes. Electronically Signed   By: Virgina Norfolk M.D.   On: 01/18/2023 21:36   DG Knee 2 Views Right  Result Date: 01/18/2023 CLINICAL DATA:  Right knee pain. EXAM: RIGHT KNEE - 1-2 VIEW COMPARISON:  None Available. FINDINGS: No evidence of fracture, dislocation, or joint effusion. Mild patellofemoral and medial  tibiofemoral compartment space narrowing is seen. Chronic changes are seen along the dorsal and ventral aspects of the right patella. Soft tissues are unremarkable. IMPRESSION: Chronic and degenerative changes, as described above, without an acute osseous abnormality. Electronically Signed   By: Virgina Norfolk M.D.   On: 01/18/2023 21:35    Procedures Procedures    Medications Ordered in ED Medications  naproxen (NAPROSYN) tablet 500 mg (500 mg Oral Given 01/18/23 2145)    ED Course/ Medical Decision Making/ A&P                             Medical Decision Making Amount and/or Complexity of Data Reviewed Labs: ordered. Radiology: ordered.  Risk Prescription drug management.  This patient is a 57 y.o. male who presents to the ED for concern of bilateral knee pain.   Differential diagnoses prior to evaluation: Chronic pain, septic joint, fracture, dislocation, ligamentous injury, arthritis  Past Medical History / Social History / Additional history: Chart reviewed. Pertinent results include: Several recent ER visits for different complaints. Several past ER visits for chronic leg pain. Most recently seen on 2/23 for dizziness, diagnosed with sinusitis and given antibiotics.  Patient had also stated that he was homeless and has been sleeping outside.  Physical Exam: Physical exam performed. The pertinent findings include: Mildly hypertensive, otherwise normal vital signs.  In no acute distress.  No bony tenderness noted to the bilateral knees.  No effusions or increased warmth.  Labs: Labs were ordered from triage including troponin, CBC, and CMP. I have reviewed the results which show no acute findings compared to prior.  Imaging: I personally ordered and reviewed x-ray of the left and right knee.  Left knee with mild tricompartmental degenerative changes, right knee with chronic degenerative changes.  I agree with the radiologist potation.  Medications / Treatment: Patient  given naproxen   Disposition: After consideration of the diagnostic results and the patients response to treatment, I feel that emergency department workup does not suggest an emergent condition requiring admission or immediate intervention beyond what has been performed at this time. The plan is: Discharge to home with recommendation for over-the-counter medications for pain likely related to osteoarthritis.  No joint effusions, no concern for septic joint.  Provided with financial resources. The patient is safe for discharge and has been instructed to return immediately for worsening symptoms, change in symptoms or any other concerns.  Final Clinical Impression(s) / ED Diagnoses Final diagnoses:  Chronic pain of both lower extremities    Rx / DC Orders ED Discharge Orders     None      Portions of this report may have been transcribed using voice recognition  software. Every effort was made to ensure accuracy; however, inadvertent computerized transcription errors may be present.    Estill Cotta 01/18/23 2218    Lacretia Leigh, MD 01/20/23 [phone]

## 2023-10-17 ENCOUNTER — Encounter (HOSPITAL_COMMUNITY): Payer: Self-pay | Admitting: *Deleted

## 2023-10-17 ENCOUNTER — Emergency Department (HOSPITAL_COMMUNITY)
Admission: EM | Admit: 2023-10-17 | Discharge: 2023-10-17 | Disposition: A | Discharge status: Home / Self Care | Payer: Self-pay | Attending: Emergency Medicine | Admitting: Emergency Medicine

## 2023-10-17 ENCOUNTER — Other Ambulatory Visit: Payer: Self-pay

## 2023-10-17 DIAGNOSIS — R519 Headache, unspecified: Secondary | ICD-10-CM | POA: Insufficient documentation

## 2023-10-17 DIAGNOSIS — M79671 Pain in right foot: Secondary | ICD-10-CM | POA: Insufficient documentation

## 2023-10-17 DIAGNOSIS — M79672 Pain in left foot: Secondary | ICD-10-CM | POA: Insufficient documentation

## 2023-10-17 MED ORDER — IBUPROFEN 400 MG PO TABS
600.0000 mg | ORAL_TABLET | Freq: Once | ORAL | Status: AC
  Administered 2023-10-17: 600 mg via ORAL
  Filled 2023-10-17: qty 1

## 2023-10-19 ENCOUNTER — Emergency Department (HOSPITAL_COMMUNITY): Payer: Self-pay

## 2023-10-19 ENCOUNTER — Other Ambulatory Visit: Payer: Self-pay

## 2023-10-19 ENCOUNTER — Encounter (HOSPITAL_COMMUNITY): Payer: Self-pay

## 2023-10-19 ENCOUNTER — Emergency Department (HOSPITAL_COMMUNITY)
Admission: EM | Admit: 2023-10-19 | Discharge: 2023-10-19 | Disposition: A | Discharge status: Home / Self Care | Payer: Self-pay | Attending: Emergency Medicine | Admitting: Emergency Medicine

## 2023-10-19 DIAGNOSIS — G44309 Post-traumatic headache, unspecified, not intractable: Secondary | ICD-10-CM | POA: Insufficient documentation

## 2023-10-19 DIAGNOSIS — R0989 Other specified symptoms and signs involving the circulatory and respiratory systems: Secondary | ICD-10-CM

## 2023-10-19 DIAGNOSIS — K0889 Other specified disorders of teeth and supporting structures: Secondary | ICD-10-CM | POA: Insufficient documentation

## 2023-10-19 DIAGNOSIS — I10 Essential (primary) hypertension: Secondary | ICD-10-CM | POA: Insufficient documentation

## 2023-10-19 MED ORDER — LABETALOL HCL 5 MG/ML IV SOLN
20.0000 mg | Freq: Once | INTRAVENOUS | Status: DC
  Filled 2023-10-19: qty 4

## 2023-10-19 MED ORDER — AMOXICILLIN 500 MG PO CAPS: 500.0000 mg | ORAL_CAPSULE | Freq: Three times a day (TID) | ORAL | 0 refills | Status: DC

## 2023-10-21 ENCOUNTER — Emergency Department (HOSPITAL_COMMUNITY): Payer: Self-pay

## 2023-10-21 ENCOUNTER — Other Ambulatory Visit: Payer: Self-pay

## 2023-10-21 ENCOUNTER — Encounter (HOSPITAL_COMMUNITY): Payer: Self-pay | Admitting: Emergency Medicine

## 2023-10-21 ENCOUNTER — Emergency Department (HOSPITAL_COMMUNITY)
Admission: EM | Admit: 2023-10-21 | Discharge: 2023-10-22 | Disposition: A | Discharge status: Home / Self Care | Payer: Self-pay | Attending: Emergency Medicine | Admitting: Emergency Medicine

## 2023-10-21 DIAGNOSIS — S60812A Abrasion of left wrist, initial encounter: Secondary | ICD-10-CM | POA: Insufficient documentation

## 2023-10-21 DIAGNOSIS — M25532 Pain in left wrist: Secondary | ICD-10-CM | POA: Insufficient documentation

## 2023-10-21 DIAGNOSIS — W19XXXA Unspecified fall, initial encounter: Secondary | ICD-10-CM

## 2023-10-21 DIAGNOSIS — M25552 Pain in left hip: Secondary | ICD-10-CM | POA: Insufficient documentation

## 2023-10-21 DIAGNOSIS — R42 Dizziness and giddiness: Secondary | ICD-10-CM | POA: Insufficient documentation

## 2023-10-21 DIAGNOSIS — M25551 Pain in right hip: Secondary | ICD-10-CM | POA: Insufficient documentation

## 2023-10-21 DIAGNOSIS — W01198A Fall on same level from slipping, tripping and stumbling with subsequent striking against other object, initial encounter: Secondary | ICD-10-CM | POA: Insufficient documentation

## 2023-10-21 LAB — CBG MONITORING, ED: Glucose-Capillary: 92 mg/dL (ref 70–99)

## 2023-10-22 ENCOUNTER — Emergency Department (HOSPITAL_COMMUNITY)
Admission: EM | Admit: 2023-10-22 | Discharge: 2023-10-22 | Disposition: A | Discharge status: Home / Self Care | Payer: Self-pay | Attending: Emergency Medicine | Admitting: Emergency Medicine

## 2023-10-22 ENCOUNTER — Other Ambulatory Visit: Payer: Self-pay

## 2023-10-22 ENCOUNTER — Encounter (HOSPITAL_COMMUNITY): Payer: Self-pay

## 2023-10-22 ENCOUNTER — Emergency Department (HOSPITAL_COMMUNITY): Payer: Self-pay

## 2023-10-22 DIAGNOSIS — R42 Dizziness and giddiness: Secondary | ICD-10-CM | POA: Insufficient documentation

## 2023-10-22 DIAGNOSIS — S60512A Abrasion of left hand, initial encounter: Secondary | ICD-10-CM | POA: Insufficient documentation

## 2023-10-22 DIAGNOSIS — W19XXXA Unspecified fall, initial encounter: Secondary | ICD-10-CM | POA: Insufficient documentation

## 2023-10-22 LAB — CBC WITH DIFFERENTIAL/PLATELET
Abs Immature Granulocytes: 0 K/uL (ref 0.00–0.07)
Basophils Absolute: 0 K/uL (ref 0.0–0.1)
Basophils Relative: 0 %
Eosinophils Absolute: 0.1 K/uL (ref 0.0–0.5)
Eosinophils Relative: 2 %
HCT: 42.8 % (ref 39.0–52.0)
Hemoglobin: 13.3 g/dL (ref 13.0–17.0)
Lymphocytes Relative: 37 %
Lymphs Abs: 1.2 K/uL (ref 0.7–4.0)
MCH: 25.7 pg — ABNORMAL LOW (ref 26.0–34.0)
MCHC: 31.1 g/dL (ref 30.0–36.0)
MCV: 82.6 fL (ref 80.0–100.0)
Monocytes Absolute: 0.2 K/uL (ref 0.1–1.0)
Monocytes Relative: 5 %
Neutro Abs: 1.8 K/uL (ref 1.7–7.7)
Neutrophils Relative %: 56 %
Platelets: 145 K/uL — ABNORMAL LOW (ref 150–400)
RBC: 5.18 MIL/uL (ref 4.22–5.81)
RDW: 15.8 % — ABNORMAL HIGH (ref 11.5–15.5)
WBC: 3.3 K/uL — ABNORMAL LOW (ref 4.0–10.5)
nRBC: 0 % (ref 0.0–0.2)
nRBC: 0 /100{WBCs}

## 2023-10-22 LAB — COMPREHENSIVE METABOLIC PANEL WITH GFR
ALT: 48 U/L — ABNORMAL HIGH (ref 0–44)
AST: 43 U/L — ABNORMAL HIGH (ref 15–41)
Albumin: 3.7 g/dL (ref 3.5–5.0)
Alkaline Phosphatase: 91 U/L (ref 38–126)
Anion gap: 12 (ref 5–15)
BUN: 19 mg/dL (ref 6–20)
CO2: 26 mmol/L (ref 22–32)
Calcium: 9.5 mg/dL (ref 8.9–10.3)
Chloride: 103 mmol/L (ref 98–111)
Creatinine, Ser: 0.79 mg/dL (ref 0.61–1.24)
GFR, Estimated: >60 mL/min (ref >60)
Glucose, Bld: 72 mg/dL (ref 70–99)
Potassium: 4.1 mmol/L (ref 3.5–5.1)
Sodium: 141 mmol/L (ref 135–145)
Total Bilirubin: 0.4 mg/dL (ref <1.2)
Total Protein: 7 g/dL (ref 6.5–8.1)

## 2023-10-22 LAB — URINALYSIS, ROUTINE W REFLEX MICROSCOPIC
Bilirubin Urine: NEGATIVE
Glucose, UA: NEGATIVE mg/dL
Hgb urine dipstick: NEGATIVE
Ketones, ur: NEGATIVE mg/dL
Leukocytes,Ua: NEGATIVE
Nitrite: NEGATIVE
Protein, ur: NEGATIVE mg/dL
Specific Gravity, Urine: 1.023 (ref 1.005–1.030)
pH: 6 (ref 5.0–8.0)

## 2023-10-22 MED ORDER — IBUPROFEN 800 MG PO TABS
800.0000 mg | ORAL_TABLET | Freq: Once | ORAL | Status: AC
  Administered 2023-10-22: 800 mg via ORAL
  Filled 2023-10-22: qty 1

## 2023-10-23 ENCOUNTER — Emergency Department (HOSPITAL_COMMUNITY)
Admission: EM | Admit: 2023-10-23 | Discharge: 2023-10-23 | Disposition: A | Discharge status: Home / Self Care | Payer: Self-pay | Attending: Emergency Medicine | Admitting: Emergency Medicine

## 2023-10-23 ENCOUNTER — Encounter (HOSPITAL_COMMUNITY): Payer: Self-pay

## 2023-10-23 DIAGNOSIS — R42 Dizziness and giddiness: Secondary | ICD-10-CM | POA: Insufficient documentation

## 2023-10-23 LAB — COMPREHENSIVE METABOLIC PANEL WITH GFR
ALT: 56 U/L — ABNORMAL HIGH (ref 0–44)
AST: 50 U/L — ABNORMAL HIGH (ref 15–41)
Albumin: 3.7 g/dL (ref 3.5–5.0)
Alkaline Phosphatase: 93 U/L (ref 38–126)
Anion gap: 9 (ref 5–15)
BUN: 27 mg/dL — ABNORMAL HIGH (ref 6–20)
CO2: 28 mmol/L (ref 22–32)
Calcium: 9.6 mg/dL (ref 8.9–10.3)
Chloride: 103 mmol/L (ref 98–111)
Creatinine, Ser: 0.78 mg/dL (ref 0.61–1.24)
GFR, Estimated: >60 mL/min (ref >60)
Glucose, Bld: 92 mg/dL (ref 70–99)
Potassium: 4.2 mmol/L (ref 3.5–5.1)
Sodium: 140 mmol/L (ref 135–145)
Total Bilirubin: 0.4 mg/dL (ref <1.2)
Total Protein: 7.2 g/dL (ref 6.5–8.1)

## 2023-10-23 LAB — I-STAT CHEM 8, ED
BUN: 30 mg/dL — ABNORMAL HIGH (ref 6–20)
Calcium, Ion: 1.2 mmol/L (ref 1.15–1.40)
Chloride: 104 mmol/L (ref 98–111)
Creatinine, Ser: 0.9 mg/dL (ref 0.61–1.24)
Glucose, Bld: 91 mg/dL (ref 70–99)
HCT: 46 % (ref 39.0–52.0)
Hemoglobin: 15.6 g/dL (ref 13.0–17.0)
Potassium: 4.1 mmol/L (ref 3.5–5.1)
Sodium: 143 mmol/L (ref 135–145)
TCO2: 30 mmol/L (ref 22–32)

## 2023-10-23 LAB — CBC
HCT: 45.9 % (ref 39.0–52.0)
Hemoglobin: 14.4 g/dL (ref 13.0–17.0)
MCH: 25.9 pg — ABNORMAL LOW (ref 26.0–34.0)
MCHC: 31.4 g/dL (ref 30.0–36.0)
MCV: 82.7 fL (ref 80.0–100.0)
Platelets: 152 K/uL (ref 150–400)
RBC: 5.55 MIL/uL (ref 4.22–5.81)
RDW: 16 % — ABNORMAL HIGH (ref 11.5–15.5)
WBC: 3.9 K/uL — ABNORMAL LOW (ref 4.0–10.5)
nRBC: 0 % (ref 0.0–0.2)

## 2023-10-23 LAB — DIFFERENTIAL
Abs Immature Granulocytes: 0.01 K/uL (ref 0.00–0.07)
Basophils Absolute: 0 K/uL (ref 0.0–0.1)
Basophils Relative: 0 %
Eosinophils Absolute: 0.1 K/uL (ref 0.0–0.5)
Eosinophils Relative: 2 %
Immature Granulocytes: 0 %
Lymphocytes Relative: 26 %
Lymphs Abs: 1 K/uL (ref 0.7–4.0)
Monocytes Absolute: 0.2 K/uL (ref 0.1–1.0)
Monocytes Relative: 5 %
Neutro Abs: 2.6 K/uL (ref 1.7–7.7)
Neutrophils Relative %: 67 %

## 2023-10-23 LAB — APTT: aPTT: 38 s — ABNORMAL HIGH (ref 24–36)

## 2023-10-23 LAB — PROTIME-INR
INR: 1 (ref 0.8–1.2)
Prothrombin Time: 13.5 s (ref 11.4–15.2)

## 2023-10-23 LAB — CBG MONITORING, ED: Glucose-Capillary: 89 mg/dL (ref 70–99)

## 2023-10-23 LAB — ETHANOL: Alcohol, Ethyl (B): <10 mg/dL (ref <10)

## 2023-10-24 ENCOUNTER — Inpatient Hospital Stay (HOSPITAL_COMMUNITY)
Admission: EM | Admit: 2023-10-24 | Discharge: 2024-06-05 | DRG: 896 | Disposition: A | Discharge status: Nursing Home (Includes ICF) | Payer: Self-pay | Attending: Internal Medicine | Admitting: Internal Medicine

## 2023-10-24 ENCOUNTER — Other Ambulatory Visit: Payer: Self-pay

## 2023-10-24 ENCOUNTER — Encounter (HOSPITAL_COMMUNITY): Payer: Self-pay

## 2023-10-24 ENCOUNTER — Emergency Department (HOSPITAL_COMMUNITY)
Admission: EM | Admit: 2023-10-24 | Discharge: 2023-10-24 | Disposition: A | Discharge status: Home / Self Care | Payer: Self-pay | Attending: Emergency Medicine | Admitting: Emergency Medicine

## 2023-10-24 ENCOUNTER — Emergency Department (HOSPITAL_COMMUNITY): Payer: Self-pay

## 2023-10-24 DIAGNOSIS — T43595A Adverse effect of other antipsychotics and neuroleptics, initial encounter: Secondary | ICD-10-CM | POA: Diagnosis not present

## 2023-10-24 DIAGNOSIS — E66812 Obesity, class 2: Secondary | ICD-10-CM | POA: Diagnosis present

## 2023-10-24 DIAGNOSIS — T424X5A Adverse effect of benzodiazepines, initial encounter: Secondary | ICD-10-CM | POA: Diagnosis not present

## 2023-10-24 DIAGNOSIS — D638 Anemia in other chronic diseases classified elsewhere: Secondary | ICD-10-CM | POA: Diagnosis not present

## 2023-10-24 DIAGNOSIS — R400 Somnolence: Secondary | ICD-10-CM | POA: Diagnosis not present

## 2023-10-24 DIAGNOSIS — R4182 Altered mental status, unspecified: Secondary | ICD-10-CM | POA: Diagnosis present

## 2023-10-24 DIAGNOSIS — R632 Polyphagia: Secondary | ICD-10-CM | POA: Diagnosis not present

## 2023-10-24 DIAGNOSIS — F04 Amnestic disorder due to known physiological condition: Secondary | ICD-10-CM | POA: Insufficient documentation

## 2023-10-24 DIAGNOSIS — E512 Wernicke's encephalopathy: Secondary | ICD-10-CM | POA: Diagnosis present

## 2023-10-24 DIAGNOSIS — E039 Hypothyroidism, unspecified: Secondary | ICD-10-CM | POA: Diagnosis present

## 2023-10-24 DIAGNOSIS — R471 Dysarthria and anarthria: Secondary | ICD-10-CM | POA: Diagnosis present

## 2023-10-24 DIAGNOSIS — Z1152 Encounter for screening for COVID-19: Secondary | ICD-10-CM

## 2023-10-24 DIAGNOSIS — G9341 Metabolic encephalopathy: Secondary | ICD-10-CM | POA: Diagnosis present

## 2023-10-24 DIAGNOSIS — Z781 Physical restraint status: Secondary | ICD-10-CM

## 2023-10-24 DIAGNOSIS — J690 Pneumonitis due to inhalation of food and vomit: Secondary | ICD-10-CM | POA: Diagnosis present

## 2023-10-24 DIAGNOSIS — Z791 Long term (current) use of non-steroidal anti-inflammatories (NSAID): Secondary | ICD-10-CM

## 2023-10-24 DIAGNOSIS — J018 Other acute sinusitis: Secondary | ICD-10-CM | POA: Diagnosis present

## 2023-10-24 DIAGNOSIS — Z9181 History of falling: Secondary | ICD-10-CM

## 2023-10-24 DIAGNOSIS — Z8673 Personal history of transient ischemic attack (TIA), and cerebral infarction without residual deficits: Secondary | ICD-10-CM

## 2023-10-24 DIAGNOSIS — E785 Hyperlipidemia, unspecified: Secondary | ICD-10-CM | POA: Diagnosis present

## 2023-10-24 DIAGNOSIS — R591 Generalized enlarged lymph nodes: Secondary | ICD-10-CM | POA: Diagnosis not present

## 2023-10-24 DIAGNOSIS — R7989 Other specified abnormal findings of blood chemistry: Secondary | ICD-10-CM | POA: Diagnosis present

## 2023-10-24 DIAGNOSIS — F1026 Alcohol dependence with alcohol-induced persisting amnestic disorder: Principal | ICD-10-CM | POA: Diagnosis present

## 2023-10-24 DIAGNOSIS — T502X5A Adverse effect of carbonic-anhydrase inhibitors, benzothiadiazides and other diuretics, initial encounter: Secondary | ICD-10-CM | POA: Diagnosis not present

## 2023-10-24 DIAGNOSIS — D72819 Decreased white blood cell count, unspecified: Secondary | ICD-10-CM | POA: Diagnosis present

## 2023-10-24 DIAGNOSIS — T68XXXA Hypothermia, initial encounter: Secondary | ICD-10-CM | POA: Diagnosis present

## 2023-10-24 DIAGNOSIS — Z653 Problems related to other legal circumstances: Secondary | ICD-10-CM

## 2023-10-24 DIAGNOSIS — Z79899 Other long term (current) drug therapy: Secondary | ICD-10-CM

## 2023-10-24 DIAGNOSIS — R600 Localized edema: Secondary | ICD-10-CM | POA: Diagnosis present

## 2023-10-24 DIAGNOSIS — F0280 Dementia in other diseases classified elsewhere without behavioral disturbance: Secondary | ICD-10-CM | POA: Diagnosis present

## 2023-10-24 DIAGNOSIS — X31XXXA Exposure to excessive natural cold, initial encounter: Secondary | ICD-10-CM

## 2023-10-24 DIAGNOSIS — J9602 Acute respiratory failure with hypercapnia: Secondary | ICD-10-CM | POA: Diagnosis present

## 2023-10-24 DIAGNOSIS — Z91119 Patient's noncompliance with dietary regimen due to unspecified reason: Secondary | ICD-10-CM

## 2023-10-24 DIAGNOSIS — T17908A Unspecified foreign body in respiratory tract, part unspecified causing other injury, initial encounter: Secondary | ICD-10-CM | POA: Diagnosis present

## 2023-10-24 DIAGNOSIS — R748 Abnormal levels of other serum enzymes: Secondary | ICD-10-CM | POA: Diagnosis present

## 2023-10-24 DIAGNOSIS — F05 Delirium due to known physiological condition: Secondary | ICD-10-CM

## 2023-10-24 DIAGNOSIS — M19011 Primary osteoarthritis, right shoulder: Secondary | ICD-10-CM | POA: Diagnosis present

## 2023-10-24 DIAGNOSIS — I10 Essential (primary) hypertension: Secondary | ICD-10-CM | POA: Diagnosis present

## 2023-10-24 DIAGNOSIS — Z751 Person awaiting admission to adequate facility elsewhere: Secondary | ICD-10-CM

## 2023-10-24 DIAGNOSIS — F17210 Nicotine dependence, cigarettes, uncomplicated: Secondary | ICD-10-CM | POA: Diagnosis present

## 2023-10-24 DIAGNOSIS — E1142 Type 2 diabetes mellitus with diabetic polyneuropathy: Secondary | ICD-10-CM | POA: Diagnosis present

## 2023-10-24 DIAGNOSIS — R32 Unspecified urinary incontinence: Secondary | ICD-10-CM | POA: Diagnosis present

## 2023-10-24 DIAGNOSIS — G629 Polyneuropathy, unspecified: Secondary | ICD-10-CM | POA: Diagnosis present

## 2023-10-24 DIAGNOSIS — K047 Periapical abscess without sinus: Secondary | ICD-10-CM | POA: Diagnosis present

## 2023-10-24 DIAGNOSIS — Z59819 Housing instability, housed unspecified: Secondary | ICD-10-CM

## 2023-10-24 DIAGNOSIS — A419 Sepsis, unspecified organism: Secondary | ICD-10-CM

## 2023-10-24 DIAGNOSIS — R278 Other lack of coordination: Secondary | ICD-10-CM | POA: Diagnosis not present

## 2023-10-24 DIAGNOSIS — Z23 Encounter for immunization: Secondary | ICD-10-CM

## 2023-10-24 DIAGNOSIS — J9601 Acute respiratory failure with hypoxia: Secondary | ICD-10-CM | POA: Diagnosis present

## 2023-10-24 DIAGNOSIS — Z6841 Body Mass Index (BMI) 40.0 and over, adult: Secondary | ICD-10-CM

## 2023-10-24 DIAGNOSIS — G2111 Neuroleptic induced parkinsonism: Secondary | ICD-10-CM | POA: Diagnosis not present

## 2023-10-24 DIAGNOSIS — Z5900 Homelessness unspecified: Secondary | ICD-10-CM

## 2023-10-24 DIAGNOSIS — K029 Dental caries, unspecified: Secondary | ICD-10-CM | POA: Diagnosis present

## 2023-10-24 DIAGNOSIS — E66813 Obesity, class 3: Secondary | ICD-10-CM | POA: Diagnosis present

## 2023-10-24 DIAGNOSIS — K0889 Other specified disorders of teeth and supporting structures: Secondary | ICD-10-CM | POA: Insufficient documentation

## 2023-10-24 DIAGNOSIS — I82611 Acute embolism and thrombosis of superficial veins of right upper extremity: Secondary | ICD-10-CM | POA: Diagnosis not present

## 2023-10-24 DIAGNOSIS — N179 Acute kidney failure, unspecified: Secondary | ICD-10-CM | POA: Diagnosis not present

## 2023-10-24 DIAGNOSIS — M545 Low back pain, unspecified: Secondary | ICD-10-CM | POA: Diagnosis not present

## 2023-10-24 DIAGNOSIS — R7401 Elevation of levels of liver transaminase levels: Secondary | ICD-10-CM | POA: Diagnosis present

## 2023-10-24 DIAGNOSIS — D696 Thrombocytopenia, unspecified: Secondary | ICD-10-CM | POA: Diagnosis present

## 2023-10-24 DIAGNOSIS — R451 Restlessness and agitation: Secondary | ICD-10-CM | POA: Diagnosis not present

## 2023-10-24 DIAGNOSIS — E876 Hypokalemia: Secondary | ICD-10-CM | POA: Diagnosis not present

## 2023-10-24 LAB — COMPREHENSIVE METABOLIC PANEL WITH GFR
ALT: 69 U/L — ABNORMAL HIGH (ref 0–44)
AST: 62 U/L — ABNORMAL HIGH (ref 15–41)
Albumin: 3.6 g/dL (ref 3.5–5.0)
Alkaline Phosphatase: 95 U/L (ref 38–126)
Anion gap: 8 (ref 5–15)
BUN: 30 mg/dL — ABNORMAL HIGH (ref 6–20)
CO2: 30 mmol/L (ref 22–32)
Calcium: 10.1 mg/dL (ref 8.9–10.3)
Chloride: 105 mmol/L (ref 98–111)
Creatinine, Ser: 0.91 mg/dL (ref 0.61–1.24)
GFR, Estimated: >60 mL/min (ref >60)
Glucose, Bld: 102 mg/dL — ABNORMAL HIGH (ref 70–99)
Potassium: 4.5 mmol/L (ref 3.5–5.1)
Sodium: 143 mmol/L (ref 135–145)
Total Bilirubin: 0.8 mg/dL (ref <1.2)
Total Protein: 7.1 g/dL (ref 6.5–8.1)

## 2023-10-24 LAB — CBC WITH DIFFERENTIAL/PLATELET
Abs Immature Granulocytes: 0.01 K/uL (ref 0.00–0.07)
Basophils Absolute: 0 K/uL (ref 0.0–0.1)
Basophils Relative: 0 %
Eosinophils Absolute: 0.1 K/uL (ref 0.0–0.5)
Eosinophils Relative: 2 %
HCT: 44.7 % (ref 39.0–52.0)
Hemoglobin: 14.1 g/dL (ref 13.0–17.0)
Immature Granulocytes: 0 %
Lymphocytes Relative: 34 %
Lymphs Abs: 1 K/uL (ref 0.7–4.0)
MCH: 26 pg (ref 26.0–34.0)
MCHC: 31.5 g/dL (ref 30.0–36.0)
MCV: 82.5 fL (ref 80.0–100.0)
Monocytes Absolute: 0.2 K/uL (ref 0.1–1.0)
Monocytes Relative: 6 %
Neutro Abs: 1.6 K/uL — ABNORMAL LOW (ref 1.7–7.7)
Neutrophils Relative %: 58 %
Platelets: 142 K/uL — ABNORMAL LOW (ref 150–400)
RBC: 5.42 MIL/uL (ref 4.22–5.81)
RDW: 16.3 % — ABNORMAL HIGH (ref 11.5–15.5)
WBC: 2.8 K/uL — ABNORMAL LOW (ref 4.0–10.5)
nRBC: 0 % (ref 0.0–0.2)

## 2023-10-24 LAB — TROPONIN I (HIGH SENSITIVITY): Troponin I (High Sensitivity): 39 ng/L — ABNORMAL HIGH (ref <18)

## 2023-10-24 LAB — I-STAT CHEM 8, ED
BUN: 32 mg/dL — ABNORMAL HIGH (ref 6–20)
Calcium, Ion: 1.28 mmol/L (ref 1.15–1.40)
Chloride: 104 mmol/L (ref 98–111)
Creatinine, Ser: 1 mg/dL (ref 0.61–1.24)
Glucose, Bld: 99 mg/dL (ref 70–99)
HCT: 46 % (ref 39.0–52.0)
Hemoglobin: 15.6 g/dL (ref 13.0–17.0)
Potassium: 4.1 mmol/L (ref 3.5–5.1)
Sodium: 143 mmol/L (ref 135–145)
TCO2: 30 mmol/L (ref 22–32)

## 2023-10-24 LAB — I-STAT CG4 LACTIC ACID, ED: Lactic Acid, Venous: 1.2 mmol/L (ref 0.5–1.9)

## 2023-10-24 MED ORDER — NAPROXEN 250 MG PO TABS
500.0000 mg | ORAL_TABLET | Freq: Once | ORAL | Status: AC
  Administered 2023-10-24: 500 mg via ORAL
  Filled 2023-10-24: qty 2

## 2023-10-24 MED ORDER — LACTATED RINGERS IV BOLUS (SEPSIS)
1000.0000 mL | Freq: Once | INTRAVENOUS | Status: AC
Start: 2023-10-25 — End: 2023-10-25
  Administered 2023-10-24: 1000 mL via INTRAVENOUS

## 2023-10-24 MED ORDER — AMOXICILLIN 500 MG PO CAPS
500.0000 mg | ORAL_CAPSULE | Freq: Once | ORAL | Status: AC
  Administered 2023-10-24: 500 mg via ORAL
  Filled 2023-10-24: qty 1

## 2023-10-24 MED ORDER — NAPROXEN 500 MG PO TABS: 500.0000 mg | ORAL_TABLET | Freq: Two times a day (BID) | ORAL | 0 refills | Status: DC

## 2023-10-24 MED ORDER — AMOXICILLIN 500 MG PO CAPS: 500.0000 mg | ORAL_CAPSULE | Freq: Three times a day (TID) | ORAL | 0 refills | Status: DC

## 2023-10-24 MED ORDER — LACTATED RINGERS IV BOLUS (SEPSIS)
500.0000 mL | Freq: Once | INTRAVENOUS | Status: AC
  Administered 2023-10-24: 500 mL via INTRAVENOUS

## 2023-10-24 MED ADMIN — Lactated Ringer's Solution: 1000 mL | INTRAVENOUS | NDC 00338011704

## 2023-10-25 ENCOUNTER — Observation Stay (HOSPITAL_COMMUNITY): Payer: Self-pay

## 2023-10-25 ENCOUNTER — Inpatient Hospital Stay (HOSPITAL_COMMUNITY): Payer: Self-pay

## 2023-10-25 DIAGNOSIS — J9601 Acute respiratory failure with hypoxia: Secondary | ICD-10-CM

## 2023-10-25 DIAGNOSIS — G9340 Encephalopathy, unspecified: Secondary | ICD-10-CM

## 2023-10-25 DIAGNOSIS — T68XXXA Hypothermia, initial encounter: Secondary | ICD-10-CM | POA: Diagnosis present

## 2023-10-25 LAB — LACTIC ACID, PLASMA
Lactic Acid, Venous: 0.7 mmol/L (ref 0.5–1.9)
Lactic Acid, Venous: 0.6 mmol/L (ref 0.5–1.9)

## 2023-10-25 LAB — HIV ANTIBODY (ROUTINE TESTING W REFLEX): HIV Screen 4th Generation wRfx: NONREACTIVE

## 2023-10-25 LAB — I-STAT ARTERIAL BLOOD GAS, ED
Acid-Base Excess: 6 mmol/L — ABNORMAL HIGH (ref 0.0–2.0)
Acid-Base Excess: 5 mmol/L — ABNORMAL HIGH (ref 0.0–2.0)
Acid-Base Excess: 3 mmol/L — ABNORMAL HIGH (ref 0.0–2.0)
Bicarbonate: 32.4 mmol/L — ABNORMAL HIGH (ref 20.0–28.0)
Bicarbonate: 30.5 mmol/L — ABNORMAL HIGH (ref 20.0–28.0)
Bicarbonate: 30.4 mmol/L — ABNORMAL HIGH (ref 20.0–28.0)
Calcium, Ion: 1.28 mmol/L (ref 1.15–1.40)
Calcium, Ion: 1.17 mmol/L (ref 1.15–1.40)
Calcium, Ion: 1.27 mmol/L (ref 1.15–1.40)
HCT: 39 % (ref 39.0–52.0)
HCT: 40 % (ref 39.0–52.0)
HCT: 41 % (ref 39.0–52.0)
Hemoglobin: 13.3 g/dL (ref 13.0–17.0)
Hemoglobin: 13.6 g/dL (ref 13.0–17.0)
Hemoglobin: 13.9 g/dL (ref 13.0–17.0)
O2 Saturation: 93 %
O2 Saturation: 86 %
O2 Saturation: 99 %
Patient temperature: 97.6
Patient temperature: 97
Patient temperature: 98.6
Potassium: 4.5 mmol/L (ref 3.5–5.1)
Potassium: 4.6 mmol/L (ref 3.5–5.1)
Potassium: 4.3 mmol/L (ref 3.5–5.1)
Sodium: 143 mmol/L (ref 135–145)
Sodium: 144 mmol/L (ref 135–145)
Sodium: 145 mmol/L (ref 135–145)
TCO2: 34 mmol/L — ABNORMAL HIGH (ref 22–32)
TCO2: 32 mmol/L (ref 22–32)
TCO2: 32 mmol/L (ref 22–32)
pCO2 arterial: 49.8 mmHg — ABNORMAL HIGH (ref 32–48)
pCO2 arterial: 44.6 mmHg (ref 32–48)
pCO2 arterial: 60.1 mmHg — ABNORMAL HIGH (ref 32–48)
pH, Arterial: 7.419 (ref 7.35–7.45)
pH, Arterial: 7.439 (ref 7.35–7.45)
pH, Arterial: 7.312 — ABNORMAL LOW (ref 7.35–7.45)
pO2, Arterial: 66 mmHg — ABNORMAL LOW (ref 83–108)
pO2, Arterial: 48 mmHg — ABNORMAL LOW (ref 83–108)
pO2, Arterial: 138 mmHg — ABNORMAL HIGH (ref 83–108)

## 2023-10-25 LAB — COMPREHENSIVE METABOLIC PANEL WITH GFR
ALT: 61 U/L — ABNORMAL HIGH (ref 0–44)
AST: 50 U/L — ABNORMAL HIGH (ref 15–41)
Albumin: 3.2 g/dL — ABNORMAL LOW (ref 3.5–5.0)
Alkaline Phosphatase: 70 U/L (ref 38–126)
Anion gap: 11 (ref 5–15)
BUN: 24 mg/dL — ABNORMAL HIGH (ref 6–20)
CO2: 28 mmol/L (ref 22–32)
Calcium: 9.3 mg/dL (ref 8.9–10.3)
Chloride: 103 mmol/L (ref 98–111)
Creatinine, Ser: 0.97 mg/dL (ref 0.61–1.24)
GFR, Estimated: >60 mL/min (ref >60)
Glucose, Bld: 71 mg/dL (ref 70–99)
Potassium: 4.6 mmol/L (ref 3.5–5.1)
Sodium: 142 mmol/L (ref 135–145)
Total Bilirubin: 0.4 mg/dL (ref <1.2)
Total Protein: 6.7 g/dL (ref 6.5–8.1)

## 2023-10-25 LAB — RESPIRATORY PANEL BY PCR

## 2023-10-25 LAB — PROTIME-INR
INR: 1 (ref 0.8–1.2)
Prothrombin Time: 13.3 s (ref 11.4–15.2)

## 2023-10-25 LAB — CBC WITH DIFFERENTIAL/PLATELET
Abs Immature Granulocytes: 0 K/uL (ref 0.00–0.07)
Basophils Absolute: 0.1 K/uL (ref 0.0–0.1)
Basophils Relative: 2 %
Eosinophils Absolute: 0 K/uL (ref 0.0–0.5)
Eosinophils Relative: 1 %
HCT: 41 % (ref 39.0–52.0)
Hemoglobin: 12.8 g/dL — ABNORMAL LOW (ref 13.0–17.0)
Lymphocytes Relative: 38 %
Lymphs Abs: 1 K/uL (ref 0.7–4.0)
MCH: 25.5 pg — ABNORMAL LOW (ref 26.0–34.0)
MCHC: 31.2 g/dL (ref 30.0–36.0)
MCV: 81.8 fL (ref 80.0–100.0)
Monocytes Absolute: 0.1 K/uL (ref 0.1–1.0)
Monocytes Relative: 4 %
Neutro Abs: 1.4 K/uL — ABNORMAL LOW (ref 1.7–7.7)
Neutrophils Relative %: 55 %
Platelets: 147 K/uL — ABNORMAL LOW (ref 150–400)
RBC: 5.01 MIL/uL (ref 4.22–5.81)
RDW: 16.1 % — ABNORMAL HIGH (ref 11.5–15.5)
WBC: 2.5 K/uL — ABNORMAL LOW (ref 4.0–10.5)
nRBC: 0.8 % — ABNORMAL HIGH (ref 0.0–0.2)
nRBC: 3 /100{WBCs} — ABNORMAL HIGH

## 2023-10-25 LAB — URINALYSIS, W/ REFLEX TO CULTURE (INFECTION SUSPECTED)
Bacteria, UA: NONE SEEN
Bilirubin Urine: NEGATIVE
Glucose, UA: NEGATIVE mg/dL
Hgb urine dipstick: NEGATIVE
Ketones, ur: NEGATIVE mg/dL
Leukocytes,Ua: NEGATIVE
Nitrite: NEGATIVE
Protein, ur: NEGATIVE mg/dL
Specific Gravity, Urine: 1.016 (ref 1.005–1.030)
pH: 6 (ref 5.0–8.0)

## 2023-10-25 LAB — RESP PANEL BY RT-PCR (RSV, FLU A&B, COVID)  RVPGX2
Influenza A by PCR: NEGATIVE
Influenza B by PCR: NEGATIVE
Resp Syncytial Virus by PCR: NEGATIVE
SARS Coronavirus 2 by RT PCR: NEGATIVE

## 2023-10-25 LAB — CREATININE, SERUM
Creatinine, Ser: 1.06 mg/dL (ref 0.61–1.24)
GFR, Estimated: >60 mL/min (ref >60)

## 2023-10-25 LAB — AMMONIA: Ammonia: 33 umol/L (ref 9–35)

## 2023-10-25 LAB — TROPONIN I (HIGH SENSITIVITY): Troponin I (High Sensitivity): 52 ng/L — ABNORMAL HIGH (ref <18)

## 2023-10-25 LAB — APTT: aPTT: 38 s — ABNORMAL HIGH (ref 24–36)

## 2023-10-25 LAB — ACETAMINOPHEN LEVEL: Acetaminophen (Tylenol), Serum: <10 ug/mL — ABNORMAL LOW (ref 10–30)

## 2023-10-25 LAB — STREP PNEUMONIAE URINARY ANTIGEN: Strep Pneumo Urinary Antigen: NEGATIVE

## 2023-10-25 LAB — CK: Total CK: 242 U/L (ref 49–397)

## 2023-10-25 LAB — C-REACTIVE PROTEIN: CRP: 1.1 mg/dL — ABNORMAL HIGH (ref <1.0)

## 2023-10-25 MED ORDER — ACETAMINOPHEN 650 MG RE SUPP: 650.0000 mg | Freq: Four times a day (QID) | RECTAL | Status: DC | PRN

## 2023-10-25 MED ORDER — SODIUM CHLORIDE 0.9 % IV SOLN
500.0000 mg | INTRAVENOUS | Status: DC
  Administered 2023-10-26: 500 mg via INTRAVENOUS
  Filled 2023-10-25: qty 5

## 2023-10-25 MED ORDER — ONDANSETRON HCL 4 MG PO TABS: 4.0000 mg | ORAL_TABLET | Freq: Four times a day (QID) | ORAL | Status: DC | PRN

## 2023-10-25 MED ORDER — ACETAMINOPHEN 325 MG PO TABS
650.0000 mg | ORAL_TABLET | Freq: Four times a day (QID) | ORAL | Status: DC | PRN
  Administered 2023-11-08 – 2024-05-26 (×41): 650 mg via ORAL
  Filled 2023-10-25 (×44): qty 2

## 2023-10-25 MED ORDER — SODIUM CHLORIDE 0.9 % IV SOLN
500.0000 mg | Freq: Once | INTRAVENOUS | Status: AC
  Administered 2023-10-25: 500 mg via INTRAVENOUS
  Filled 2023-10-25: qty 5

## 2023-10-25 MED ORDER — SODIUM CHLORIDE 0.9% FLUSH
3.0000 mL | Freq: Two times a day (BID) | INTRAVENOUS | Status: DC
  Administered 2023-10-25 – 2023-11-20 (×48): 3 mL via INTRAVENOUS

## 2023-10-25 MED ORDER — ONDANSETRON HCL 4 MG/2ML IJ SOLN: 4.0000 mg | Freq: Four times a day (QID) | INTRAMUSCULAR | Status: DC | PRN

## 2023-10-25 MED ORDER — ENOXAPARIN SODIUM 40 MG/0.4ML IJ SOSY
40.0000 mg | PREFILLED_SYRINGE | INTRAMUSCULAR | Status: DC
  Administered 2023-10-25 – 2023-10-26 (×2): 40 mg via SUBCUTANEOUS
  Filled 2023-10-25 (×2): qty 0.4

## 2023-10-25 MED ORDER — NALOXONE HCL 0.4 MG/ML IJ SOLN
0.4000 mg | INTRAMUSCULAR | Status: DC | PRN
  Administered 2023-10-25: 0.4 mg via INTRAVENOUS
  Filled 2023-10-25: qty 1

## 2023-10-25 MED ORDER — SODIUM CHLORIDE 0.9 % IV SOLN
1.0000 g | INTRAVENOUS | Status: DC
  Administered 2023-10-26: 1 g via INTRAVENOUS
  Filled 2023-10-25: qty 10

## 2023-10-25 MED ORDER — SODIUM CHLORIDE 0.9 % IV SOLN: INTRAVENOUS | Status: DC

## 2023-10-25 MED ORDER — SODIUM CHLORIDE 0.9 % IV SOLN
1.0000 g | Freq: Once | INTRAVENOUS | Status: AC
  Administered 2023-10-25: 1 g via INTRAVENOUS
  Filled 2023-10-25: qty 10

## 2023-10-26 ENCOUNTER — Encounter (HOSPITAL_COMMUNITY): Payer: Self-pay | Admitting: Anesthesiology

## 2023-10-26 ENCOUNTER — Encounter (HOSPITAL_COMMUNITY): Payer: Self-pay | Admitting: Nurse Practitioner

## 2023-10-26 ENCOUNTER — Inpatient Hospital Stay (HOSPITAL_COMMUNITY): Payer: Self-pay

## 2023-10-26 DIAGNOSIS — A419 Sepsis, unspecified organism: Secondary | ICD-10-CM

## 2023-10-26 DIAGNOSIS — R4182 Altered mental status, unspecified: Secondary | ICD-10-CM | POA: Diagnosis present

## 2023-10-26 DIAGNOSIS — R569 Unspecified convulsions: Secondary | ICD-10-CM

## 2023-10-26 DIAGNOSIS — T17908A Unspecified foreign body in respiratory tract, part unspecified causing other injury, initial encounter: Secondary | ICD-10-CM | POA: Diagnosis present

## 2023-10-26 DIAGNOSIS — J9601 Acute respiratory failure with hypoxia: Secondary | ICD-10-CM | POA: Diagnosis present

## 2023-10-26 DIAGNOSIS — G9341 Metabolic encephalopathy: Secondary | ICD-10-CM

## 2023-10-26 DIAGNOSIS — D696 Thrombocytopenia, unspecified: Secondary | ICD-10-CM

## 2023-10-26 LAB — COMPREHENSIVE METABOLIC PANEL WITH GFR
ALT: 55 U/L — ABNORMAL HIGH (ref 0–44)
AST: 36 U/L (ref 15–41)
Albumin: 3 g/dL — ABNORMAL LOW (ref 3.5–5.0)
Alkaline Phosphatase: 69 U/L (ref 38–126)
Anion gap: 8 (ref 5–15)
BUN: 28 mg/dL — ABNORMAL HIGH (ref 6–20)
CO2: 26 mmol/L (ref 22–32)
Calcium: 8.7 mg/dL — ABNORMAL LOW (ref 8.9–10.3)
Chloride: 109 mmol/L (ref 98–111)
Creatinine, Ser: 1.28 mg/dL — ABNORMAL HIGH (ref 0.61–1.24)
GFR, Estimated: >60 mL/min (ref >60)
Glucose, Bld: 83 mg/dL (ref 70–99)
Potassium: 4.2 mmol/L (ref 3.5–5.1)
Sodium: 143 mmol/L (ref 135–145)
Total Bilirubin: 0.3 mg/dL (ref <1.2)
Total Protein: 6.6 g/dL (ref 6.5–8.1)

## 2023-10-26 LAB — BLOOD GAS, ARTERIAL
Acid-Base Excess: 7.8 mmol/L — ABNORMAL HIGH (ref 0.0–2.0)
Bicarbonate: 33.3 mmol/L — ABNORMAL HIGH (ref 20.0–28.0)
O2 Saturation: 97.8 %
Patient temperature: 37.6
pCO2 arterial: 50 mmHg — ABNORMAL HIGH (ref 32–48)
pH, Arterial: 7.43 (ref 7.35–7.45)
pO2, Arterial: 85 mmHg (ref 83–108)

## 2023-10-26 LAB — CBC
HCT: 41.6 % (ref 39.0–52.0)
Hemoglobin: 13.1 g/dL (ref 13.0–17.0)
MCH: 25.8 pg — ABNORMAL LOW (ref 26.0–34.0)
MCHC: 31.5 g/dL (ref 30.0–36.0)
MCV: 82.1 fL (ref 80.0–100.0)
Platelets: 105 K/uL — ABNORMAL LOW (ref 150–400)
RBC: 5.07 MIL/uL (ref 4.22–5.81)
RDW: 16.6 % — ABNORMAL HIGH (ref 11.5–15.5)
WBC: 11.8 K/uL — ABNORMAL HIGH (ref 4.0–10.5)
nRBC: 0 % (ref 0.0–0.2)

## 2023-10-26 LAB — CBC WITH DIFFERENTIAL/PLATELET
Abs Immature Granulocytes: 0 K/uL (ref 0.00–0.07)
Basophils Absolute: 0 K/uL (ref 0.0–0.1)
Basophils Relative: 0 %
Eosinophils Absolute: 0 K/uL (ref 0.0–0.5)
Eosinophils Relative: 0 %
HCT: 38 % — ABNORMAL LOW (ref 39.0–52.0)
Hemoglobin: 11.9 g/dL — ABNORMAL LOW (ref 13.0–17.0)
Lymphocytes Relative: 17 %
Lymphs Abs: 1.8 K/uL (ref 0.7–4.0)
MCH: 25.7 pg — ABNORMAL LOW (ref 26.0–34.0)
MCHC: 31.3 g/dL (ref 30.0–36.0)
MCV: 82.1 fL (ref 80.0–100.0)
Monocytes Absolute: 0 K/uL — ABNORMAL LOW (ref 0.1–1.0)
Monocytes Relative: 0 %
Neutro Abs: 8.7 K/uL — ABNORMAL HIGH (ref 1.7–7.7)
Neutrophils Relative %: 83 %
Platelets: 105 K/uL — ABNORMAL LOW (ref 150–400)
RBC: 4.63 MIL/uL (ref 4.22–5.81)
RDW: 16.7 % — ABNORMAL HIGH (ref 11.5–15.5)
WBC: 10.5 K/uL (ref 4.0–10.5)
nRBC: 0 % (ref 0.0–0.2)
nRBC: 0 /100{WBCs}

## 2023-10-26 LAB — POCT I-STAT 7, (LYTES, BLD GAS, ICA,H+H)
Acid-Base Excess: 4 mmol/L — ABNORMAL HIGH (ref 0.0–2.0)
Bicarbonate: 30.5 mmol/L — ABNORMAL HIGH (ref 20.0–28.0)
Calcium, Ion: 1.22 mmol/L (ref 1.15–1.40)
HCT: 38 % — ABNORMAL LOW (ref 39.0–52.0)
Hemoglobin: 12.9 g/dL — ABNORMAL LOW (ref 13.0–17.0)
O2 Saturation: 100 %
Patient temperature: 98
Potassium: 3.7 mmol/L (ref 3.5–5.1)
Sodium: 144 mmol/L (ref 135–145)
TCO2: 32 mmol/L (ref 22–32)
pCO2 arterial: 49.4 mmHg — ABNORMAL HIGH (ref 32–48)
pH, Arterial: 7.397 (ref 7.35–7.45)
pO2, Arterial: 185 mmHg — ABNORMAL HIGH (ref 83–108)

## 2023-10-26 LAB — VITAMIN B12: Vitamin B-12: 504 pg/mL (ref 180–914)

## 2023-10-26 LAB — GLUCOSE, CAPILLARY
Glucose-Capillary: 104 mg/dL — ABNORMAL HIGH (ref 70–99)
Glucose-Capillary: 105 mg/dL — ABNORMAL HIGH (ref 70–99)
Glucose-Capillary: 76 mg/dL (ref 70–99)
Glucose-Capillary: 87 mg/dL (ref 70–99)

## 2023-10-26 LAB — RAPID URINE DRUG SCREEN, HOSP PERFORMED
Amphetamines: NOT DETECTED
Barbiturates: NOT DETECTED
Benzodiazepines: NOT DETECTED
Cocaine: NOT DETECTED
Opiates: NOT DETECTED
Tetrahydrocannabinol: NOT DETECTED

## 2023-10-26 LAB — MRSA NEXT GEN BY PCR, NASAL: MRSA by PCR Next Gen: NOT DETECTED

## 2023-10-26 MED ORDER — FENTANYL CITRATE PF 50 MCG/ML IJ SOSY
PREFILLED_SYRINGE | INTRAMUSCULAR | Status: AC
  Administered 2023-10-26: 100 ug via INTRAVENOUS
  Filled 2023-10-26: qty 2

## 2023-10-26 MED ORDER — ORAL CARE MOUTH RINSE
15.0000 mL | OROMUCOSAL | Status: DC
  Administered 2023-10-26 – 2023-10-29 (×27): 15 mL via OROMUCOSAL

## 2023-10-26 MED ORDER — MIDAZOLAM HCL 2 MG/2ML IJ SOLN: 2.0000 mg | Freq: Once | INTRAMUSCULAR | Status: AC

## 2023-10-26 MED ORDER — DEXMEDETOMIDINE HCL IN NACL 400 MCG/100ML IV SOLN
0.0000 ug/kg/h | INTRAVENOUS | Status: DC
  Administered 2023-10-26: 0.4 ug/kg/h via INTRAVENOUS
  Administered 2023-10-27: 0.3 ug/kg/h via INTRAVENOUS
  Administered 2023-10-27: 0.5 ug/kg/h via INTRAVENOUS
  Administered 2023-10-28: 0.9 ug/kg/h via INTRAVENOUS
  Filled 2023-10-26 (×5): qty 100

## 2023-10-26 MED ORDER — FENTANYL BOLUS VIA INFUSION
50.0000 ug | INTRAVENOUS | Status: DC | PRN
  Administered 2023-10-26 (×2): 50 ug via INTRAVENOUS
  Administered 2023-10-26 – 2023-10-28 (×13): 100 ug via INTRAVENOUS

## 2023-10-26 MED ORDER — MIDAZOLAM HCL 2 MG/2ML IJ SOLN
INTRAMUSCULAR | Status: AC
  Administered 2023-10-26: 2 mg via INTRAVENOUS
  Filled 2023-10-26: qty 2

## 2023-10-26 MED ORDER — ETOMIDATE 2 MG/ML IV SOLN
INTRAVENOUS | Status: AC
  Filled 2023-10-26: qty 20

## 2023-10-26 MED ORDER — LIDOCAINE 1 % OPTIME INJ - NO CHARGE
5.0000 mL | Freq: Once | INTRAMUSCULAR | Status: AC
Start: 2023-10-26 — End: 2023-10-27
  Administered 2023-10-27: 5 mL via INTRADERMAL
  Filled 2023-10-26: qty 6

## 2023-10-26 MED ORDER — ETOMIDATE 2 MG/ML IV SOLN
20.0000 mg | Freq: Once | INTRAVENOUS | Status: AC
  Administered 2023-10-26: 20 mg via INTRAVENOUS

## 2023-10-26 MED ORDER — SODIUM CHLORIDE 0.9 % IV SOLN
2.0000 g | INTRAVENOUS | Status: DC
  Administered 2023-10-26 – 2023-10-27 (×7): 2 g via INTRAVENOUS
  Filled 2023-10-26 (×9): qty 2000

## 2023-10-26 MED ORDER — GLYCOPYRROLATE 0.2 MG/ML IJ SOLN
0.2000 mg | Freq: Three times a day (TID) | INTRAMUSCULAR | Status: DC
  Administered 2023-10-26: 0.2 mg via INTRAVENOUS
  Filled 2023-10-26: qty 1

## 2023-10-26 MED ORDER — FENTANYL CITRATE PF 50 MCG/ML IJ SOSY: 100.0000 ug | PREFILLED_SYRINGE | Freq: Once | INTRAMUSCULAR | Status: AC

## 2023-10-26 MED ORDER — SODIUM CHLORIDE 0.9 % IV SOLN: 2.0000 g | INTRAVENOUS | Status: DC

## 2023-10-26 MED ORDER — FENTANYL CITRATE PF 50 MCG/ML IJ SOSY: 25.0000 ug | PREFILLED_SYRINGE | INTRAMUSCULAR | Status: DC | PRN

## 2023-10-26 MED ORDER — VANCOMYCIN HCL IN DEXTROSE 1-5 GM/200ML-% IV SOLN
1000.0000 mg | Freq: Two times a day (BID) | INTRAVENOUS | Status: DC
  Administered 2023-10-27 (×2): 1000 mg via INTRAVENOUS
  Filled 2023-10-26 (×2): qty 200

## 2023-10-26 MED ORDER — DEXTROSE 5 % IV SOLN
10.0000 mg/kg | Freq: Three times a day (TID) | INTRAVENOUS | Status: DC
  Administered 2023-10-26 – 2023-10-27 (×3): 990 mg via INTRAVENOUS
  Filled 2023-10-26 (×7): qty 19.8

## 2023-10-26 MED ORDER — SODIUM CHLORIDE 0.9 % IV SOLN: 1.0000 g | Freq: Once | INTRAVENOUS | Status: DC

## 2023-10-26 MED ORDER — SODIUM CHLORIDE 0.9 % IV SOLN
2.0000 g | Freq: Two times a day (BID) | INTRAVENOUS | Status: DC
  Administered 2023-10-26 – 2023-10-27 (×3): 2 g via INTRAVENOUS
  Filled 2023-10-26 (×3): qty 20

## 2023-10-26 MED ORDER — FENTANYL CITRATE PF 50 MCG/ML IJ SOSY
50.0000 ug | PREFILLED_SYRINGE | Freq: Once | INTRAMUSCULAR | Status: AC
  Administered 2023-10-26: 50 ug via INTRAVENOUS
  Filled 2023-10-26: qty 1

## 2023-10-26 MED ORDER — VANCOMYCIN HCL IN DEXTROSE 1-5 GM/200ML-% IV SOLN
1000.0000 mg | Freq: Once | INTRAVENOUS | Status: AC
  Administered 2023-10-26: 1000 mg via INTRAVENOUS
  Filled 2023-10-26: qty 200

## 2023-10-26 MED ORDER — ROCURONIUM BROMIDE 10 MG/ML (PF) SYRINGE
100.0000 mg | PREFILLED_SYRINGE | Freq: Once | INTRAVENOUS | Status: AC
  Administered 2023-10-26: 50 mg via INTRAVENOUS

## 2023-10-26 MED ORDER — ROCURONIUM BROMIDE 10 MG/ML (PF) SYRINGE
PREFILLED_SYRINGE | INTRAVENOUS | Status: AC
  Filled 2023-10-26: qty 10

## 2023-10-26 MED ORDER — MIDAZOLAM HCL 2 MG/2ML IJ SOLN: 1.0000 mg | INTRAMUSCULAR | Status: DC | PRN

## 2023-10-26 MED ORDER — FENTANYL 2500MCG IN NS 250ML (10MCG/ML) PREMIX INFUSION
50.0000 ug/h | INTRAVENOUS | Status: DC
  Administered 2023-10-26: 50 ug/h via INTRAVENOUS
  Administered 2023-10-27 (×3): 200 ug/h via INTRAVENOUS
  Filled 2023-10-26 (×4): qty 250

## 2023-10-26 MED ORDER — LORAZEPAM 2 MG/ML IJ SOLN: 1.0000 mg | Freq: Once | INTRAMUSCULAR | Status: DC | PRN

## 2023-10-26 MED ORDER — LORAZEPAM 2 MG/ML IJ SOLN
1.0000 mg | Freq: Once | INTRAMUSCULAR | Status: AC | PRN
  Administered 2023-10-26: 2 mg via INTRAVENOUS
  Filled 2023-10-26: qty 1

## 2023-10-26 MED ORDER — VANCOMYCIN HCL 2000 MG/400ML IV SOLN
2000.0000 mg | Freq: Once | INTRAVENOUS | Status: DC
  Filled 2023-10-26: qty 400

## 2023-10-26 MED ORDER — CHLORHEXIDINE GLUCONATE CLOTH 2 % EX PADS
6.0000 | MEDICATED_PAD | Freq: Every day | CUTANEOUS | Status: DC
  Administered 2023-10-26 – 2023-10-29 (×4): 6 via TOPICAL

## 2023-10-26 MED ORDER — ENOXAPARIN SODIUM 40 MG/0.4ML IJ SOSY: 40.0000 mg | PREFILLED_SYRINGE | INTRAMUSCULAR | Status: DC

## 2023-10-26 MED ORDER — HALOPERIDOL LACTATE 5 MG/ML IJ SOLN: 5.0000 mg | Freq: Once | INTRAMUSCULAR | Status: DC

## 2023-10-26 MED ORDER — MIDAZOLAM HCL 2 MG/2ML IJ SOLN
2.0000 mg | INTRAMUSCULAR | Status: DC | PRN
  Administered 2023-10-26 – 2023-10-28 (×6): 2 mg via INTRAVENOUS
  Filled 2023-10-26 (×7): qty 2

## 2023-10-26 NOTE — Progress Notes (Signed)
LTM EEG hooked up and running - no initial skin breakdown - push button tested - [facility] monitoring.  

## 2023-10-27 ENCOUNTER — Encounter (HOSPITAL_COMMUNITY)
Admission: EM | Disposition: A | Discharge status: Nursing Home (Includes ICF) | Payer: Self-pay | Source: Home / Self Care | Attending: Internal Medicine

## 2023-10-27 ENCOUNTER — Inpatient Hospital Stay (HOSPITAL_COMMUNITY): Payer: Self-pay

## 2023-10-27 ENCOUNTER — Encounter (HOSPITAL_COMMUNITY): Payer: Self-pay | Admitting: Nurse Practitioner

## 2023-10-27 DIAGNOSIS — G9341 Metabolic encephalopathy: Secondary | ICD-10-CM | POA: Diagnosis present

## 2023-10-27 DIAGNOSIS — R404 Transient alteration of awareness: Secondary | ICD-10-CM

## 2023-10-27 DIAGNOSIS — Z5900 Homelessness unspecified: Secondary | ICD-10-CM

## 2023-10-27 DIAGNOSIS — D696 Thrombocytopenia, unspecified: Secondary | ICD-10-CM | POA: Diagnosis present

## 2023-10-27 DIAGNOSIS — E512 Wernicke's encephalopathy: Secondary | ICD-10-CM

## 2023-10-27 LAB — CSF CELL COUNT WITH DIFFERENTIAL
RBC Count, CSF: 1 /mm3 — ABNORMAL HIGH
RBC Count, CSF: 4 /mm3 — ABNORMAL HIGH
Tube #: 1
Tube #: 4
WBC, CSF: 0 /mm3 (ref 0–5)
WBC, CSF: 0 /mm3 (ref 0–5)

## 2023-10-27 LAB — MENINGITIS/ENCEPHALITIS PANEL (CSF)

## 2023-10-27 LAB — COMPREHENSIVE METABOLIC PANEL WITH GFR
ALT: 48 U/L — ABNORMAL HIGH (ref 0–44)
AST: 73 U/L — ABNORMAL HIGH (ref 15–41)
Albumin: 2.5 g/dL — ABNORMAL LOW (ref 3.5–5.0)
Alkaline Phosphatase: 55 U/L (ref 38–126)
Anion gap: 14 (ref 5–15)
BUN: 25 mg/dL — ABNORMAL HIGH (ref 6–20)
CO2: 22 mmol/L (ref 22–32)
Calcium: 8.4 mg/dL — ABNORMAL LOW (ref 8.9–10.3)
Chloride: 104 mmol/L (ref 98–111)
Creatinine, Ser: 1.13 mg/dL (ref 0.61–1.24)
GFR, Estimated: >60 mL/min (ref >60)
Glucose, Bld: 101 mg/dL — ABNORMAL HIGH (ref 70–99)
Potassium: 3.3 mmol/L — ABNORMAL LOW (ref 3.5–5.1)
Sodium: 140 mmol/L (ref 135–145)
Total Bilirubin: 0.5 mg/dL (ref <1.2)
Total Protein: 5.8 g/dL — ABNORMAL LOW (ref 6.5–8.1)

## 2023-10-27 LAB — CBC WITH DIFFERENTIAL/PLATELET
Abs Immature Granulocytes: 0.01 K/uL (ref 0.00–0.07)
Basophils Absolute: 0 K/uL (ref 0.0–0.1)
Basophils Relative: 0 %
Eosinophils Absolute: 0 K/uL (ref 0.0–0.5)
Eosinophils Relative: 1 %
HCT: 36.2 % — ABNORMAL LOW (ref 39.0–52.0)
Hemoglobin: 11.6 g/dL — ABNORMAL LOW (ref 13.0–17.0)
Immature Granulocytes: 0 %
Lymphocytes Relative: 29 %
Lymphs Abs: 1.5 K/uL (ref 0.7–4.0)
MCH: 26 pg (ref 26.0–34.0)
MCHC: 32 g/dL (ref 30.0–36.0)
MCV: 81 fL (ref 80.0–100.0)
Monocytes Absolute: 0.2 K/uL (ref 0.1–1.0)
Monocytes Relative: 5 %
Neutro Abs: 3.3 K/uL (ref 1.7–7.7)
Neutrophils Relative %: 65 %
Platelets: 89 K/uL — ABNORMAL LOW (ref 150–400)
RBC: 4.47 MIL/uL (ref 4.22–5.81)
RDW: 16.3 % — ABNORMAL HIGH (ref 11.5–15.5)
WBC: 5 K/uL (ref 4.0–10.5)
nRBC: 0 % (ref 0.0–0.2)

## 2023-10-27 LAB — GLUCOSE, CAPILLARY
Glucose-Capillary: 102 mg/dL — ABNORMAL HIGH (ref 70–99)
Glucose-Capillary: 79 mg/dL (ref 70–99)
Glucose-Capillary: 94 mg/dL (ref 70–99)
Glucose-Capillary: 113 mg/dL — ABNORMAL HIGH (ref 70–99)
Glucose-Capillary: 121 mg/dL — ABNORMAL HIGH (ref 70–99)

## 2023-10-27 LAB — TECHNOLOGIST SMEAR REVIEW: Plt Morphology: NORMAL

## 2023-10-27 LAB — IMMATURE PLATELET FRACTION: Immature Platelet Fraction: 15.4 % — ABNORMAL HIGH (ref 1.2–8.6)

## 2023-10-27 LAB — URINE CULTURE: Culture: NO GROWTH

## 2023-10-27 LAB — LIPID PANEL
Cholesterol: 127 mg/dL (ref 0–200)
HDL: 54 mg/dL (ref >40)
LDL Cholesterol: 60 mg/dL (ref 0–99)
Total CHOL/HDL Ratio: 2.4 ratio
Triglycerides: 66 mg/dL (ref <150)
VLDL: 13 mg/dL (ref 0–40)

## 2023-10-27 LAB — CRYPTOCOCCAL ANTIGEN, CSF: Crypto Ag: NEGATIVE

## 2023-10-27 LAB — PROTEIN AND GLUCOSE, CSF
Glucose, CSF: 53 mg/dL (ref 40–70)
Total  Protein, CSF: 96 mg/dL — ABNORMAL HIGH (ref 15–45)

## 2023-10-27 LAB — RPR: RPR Ser Ql: NONREACTIVE

## 2023-10-27 LAB — MAGNESIUM: Magnesium: 1.9 mg/dL (ref 1.7–2.4)

## 2023-10-27 LAB — TSH: TSH: 1.608 u[IU]/mL (ref 0.350–4.500)

## 2023-10-27 LAB — PHOSPHORUS: Phosphorus: 2.4 mg/dL — ABNORMAL LOW (ref 2.5–4.6)

## 2023-10-27 SURGERY — IR WITH ANESTHESIA
Anesthesia: Monitor Anesthesia Care

## 2023-10-27 MED ORDER — SENNOSIDES-DOCUSATE SODIUM 8.6-50 MG PO TABS
2.0000 | ORAL_TABLET | Freq: Two times a day (BID) | ORAL | Status: DC
  Administered 2023-10-27 – 2023-10-28 (×3): 2
  Filled 2023-10-27 (×2): qty 2

## 2023-10-27 MED ORDER — ROSUVASTATIN CALCIUM 20 MG PO TABS
20.0000 mg | ORAL_TABLET | Freq: Every day | ORAL | Status: DC
  Administered 2023-10-27 – 2023-10-28 (×2): 20 mg
  Filled 2023-10-27: qty 1

## 2023-10-27 MED ORDER — ENOXAPARIN SODIUM 40 MG/0.4ML IJ SOSY
40.0000 mg | PREFILLED_SYRINGE | INTRAMUSCULAR | Status: DC
  Administered 2023-10-27: 40 mg via SUBCUTANEOUS
  Filled 2023-10-27: qty 0.4

## 2023-10-27 MED ORDER — DEXAMETHASONE SODIUM PHOSPHATE 10 MG/ML IJ SOLN
10.0000 mg | Freq: Four times a day (QID) | INTRAMUSCULAR | Status: DC
  Administered 2023-10-27: 10 mg via INTRAVENOUS
  Filled 2023-10-27 (×2): qty 1

## 2023-10-27 MED ORDER — MAGNESIUM SULFATE 2 GM/50ML IV SOLN
2.0000 g | Freq: Once | INTRAVENOUS | Status: AC
  Administered 2023-10-27: 2 g via INTRAVENOUS
  Filled 2023-10-27: qty 50

## 2023-10-27 MED ORDER — VITAL 1.5 CAL PO LIQD
1000.0000 mL | ORAL | Status: DC
  Administered 2023-10-27: 1000 mL

## 2023-10-27 MED ORDER — POTASSIUM CHLORIDE 10 MEQ/100ML IV SOLN
10.0000 meq | INTRAVENOUS | Status: AC
  Administered 2023-10-27 (×4): 10 meq via INTRAVENOUS
  Filled 2023-10-27 (×4): qty 100

## 2023-10-27 MED ORDER — THIAMINE MONONITRATE 100 MG PO TABS
100.0000 mg | ORAL_TABLET | Freq: Every day | ORAL | Status: DC
  Administered 2023-10-27 – 2023-10-28 (×2): 100 mg
  Filled 2023-10-27: qty 1

## 2023-10-27 MED ORDER — SODIUM CHLORIDE 0.9 % IV SOLN: 2.0000 g | INTRAVENOUS | Status: DC

## 2023-10-27 MED ORDER — POTASSIUM CHLORIDE 20 MEQ PO PACK
20.0000 meq | PACK | ORAL | Status: AC
  Administered 2023-10-27 (×2): 20 meq
  Filled 2023-10-27 (×2): qty 1

## 2023-10-27 MED ADMIN — Protein Liquid (Enteral): 60 mL | NDC 9468801446

## 2023-10-27 MED FILL — Protein Liquid (Enteral): 60.0000 mL | ENTERAL | Qty: 60 | Status: AC

## 2023-10-27 MED FILL — Nutritional Supplement Liquid: 1000.0000 mL | ORAL | Qty: 1000 | Status: AC

## 2023-10-28 ENCOUNTER — Inpatient Hospital Stay (HOSPITAL_COMMUNITY): Payer: Self-pay

## 2023-10-28 LAB — COMPREHENSIVE METABOLIC PANEL WITH GFR
ALT: 52 U/L — ABNORMAL HIGH (ref 0–44)
AST: 62 U/L — ABNORMAL HIGH (ref 15–41)
Albumin: 2.8 g/dL — ABNORMAL LOW (ref 3.5–5.0)
Alkaline Phosphatase: 80 U/L (ref 38–126)
Anion gap: 10 (ref 5–15)
BUN: 23 mg/dL — ABNORMAL HIGH (ref 6–20)
CO2: 20 mmol/L — ABNORMAL LOW (ref 22–32)
Calcium: 9 mg/dL (ref 8.9–10.3)
Chloride: 107 mmol/L (ref 98–111)
Creatinine, Ser: 1.17 mg/dL (ref 0.61–1.24)
GFR, Estimated: >60 mL/min (ref >60)
Glucose, Bld: 135 mg/dL — ABNORMAL HIGH (ref 70–99)
Potassium: 3.9 mmol/L (ref 3.5–5.1)
Sodium: 137 mmol/L (ref 135–145)
Total Bilirubin: 0.7 mg/dL (ref <1.2)
Total Protein: 6.5 g/dL (ref 6.5–8.1)

## 2023-10-28 LAB — CULTURE, RESPIRATORY W GRAM STAIN: Culture: NORMAL

## 2023-10-28 LAB — CBC WITH DIFFERENTIAL/PLATELET
Abs Immature Granulocytes: 0.02 K/uL (ref 0.00–0.07)
Basophils Absolute: 0 K/uL (ref 0.0–0.1)
Basophils Relative: 0 %
Eosinophils Absolute: 0 K/uL (ref 0.0–0.5)
Eosinophils Relative: 0 %
HCT: 39.2 % (ref 39.0–52.0)
Hemoglobin: 12.8 g/dL — ABNORMAL LOW (ref 13.0–17.0)
Immature Granulocytes: 1 %
Lymphocytes Relative: 18 %
Lymphs Abs: 0.7 K/uL (ref 0.7–4.0)
MCH: 25.7 pg — ABNORMAL LOW (ref 26.0–34.0)
MCHC: 32.7 g/dL (ref 30.0–36.0)
MCV: 78.6 fL — ABNORMAL LOW (ref 80.0–100.0)
Monocytes Absolute: 0.2 K/uL (ref 0.1–1.0)
Monocytes Relative: 4 %
Neutro Abs: 3.1 K/uL (ref 1.7–7.7)
Neutrophils Relative %: 77 %
Platelets: 108 K/uL — ABNORMAL LOW (ref 150–400)
RBC: 4.99 MIL/uL (ref 4.22–5.81)
RDW: 16.1 % — ABNORMAL HIGH (ref 11.5–15.5)
WBC: 4.1 K/uL (ref 4.0–10.5)
nRBC: 0 % (ref 0.0–0.2)

## 2023-10-28 LAB — MAGNESIUM: Magnesium: 2.3 mg/dL (ref 1.7–2.4)

## 2023-10-28 LAB — PHOSPHORUS
Phosphorus: 4 mg/dL (ref 2.5–4.6)
Phosphorus: 3.6 mg/dL (ref 2.5–4.6)

## 2023-10-28 LAB — GLUCOSE, CAPILLARY
Glucose-Capillary: 101 mg/dL — ABNORMAL HIGH (ref 70–99)
Glucose-Capillary: 112 mg/dL — ABNORMAL HIGH (ref 70–99)
Glucose-Capillary: 113 mg/dL — ABNORMAL HIGH (ref 70–99)
Glucose-Capillary: 126 mg/dL — ABNORMAL HIGH (ref 70–99)
Glucose-Capillary: 130 mg/dL — ABNORMAL HIGH (ref 70–99)
Glucose-Capillary: 133 mg/dL — ABNORMAL HIGH (ref 70–99)
Glucose-Capillary: 133 mg/dL — ABNORMAL HIGH (ref 70–99)

## 2023-10-28 MED ORDER — SODIUM CHLORIDE 0.9 % IV SOLN
3.0000 g | Freq: Four times a day (QID) | INTRAVENOUS | Status: DC
  Administered 2023-10-28 – 2023-10-31 (×13): 3 g via INTRAVENOUS
  Filled 2023-10-28 (×13): qty 8

## 2023-10-28 MED ORDER — ENOXAPARIN SODIUM 80 MG/0.8ML IJ SOSY
70.0000 mg | PREFILLED_SYRINGE | INTRAMUSCULAR | Status: DC
  Administered 2023-10-28 – 2024-01-09 (×74): 70 mg via SUBCUTANEOUS
  Filled 2023-10-28 (×69): qty 0.8
  Filled 2023-10-28: qty 0.7
  Filled 2023-10-28 (×4): qty 0.8

## 2023-10-28 MED ORDER — ENSURE ENLIVE PO LIQD
237.0000 mL | Freq: Two times a day (BID) | ORAL | Status: DC
  Administered 2023-10-28 – 2024-05-10 (×359): 237 mL via ORAL

## 2023-10-28 MED ORDER — BUDESONIDE 0.5 MG/2ML IN SUSP
0.5000 mg | Freq: Two times a day (BID) | RESPIRATORY_TRACT | Status: DC
  Administered 2023-10-28 – 2023-11-13 (×32): 0.5 mg via RESPIRATORY_TRACT
  Filled 2023-10-28 (×33): qty 2

## 2023-10-28 MED ORDER — IOHEXOL 350 MG/ML SOLN
75.0000 mL | Freq: Once | INTRAVENOUS | Status: AC | PRN
  Administered 2023-10-28: 75 mL via INTRAVENOUS

## 2023-10-28 MED ORDER — SENNOSIDES-DOCUSATE SODIUM 8.6-50 MG PO TABS
2.0000 | ORAL_TABLET | Freq: Two times a day (BID) | ORAL | Status: DC
  Administered 2023-10-28 – 2024-06-05 (×397): 2 via ORAL
  Filled 2023-10-28 (×425): qty 2

## 2023-10-28 MED ORDER — ARFORMOTEROL TARTRATE 15 MCG/2ML IN NEBU
15.0000 ug | INHALATION_SOLUTION | Freq: Two times a day (BID) | RESPIRATORY_TRACT | Status: DC
  Administered 2023-10-28 – 2023-11-13 (×32): 15 ug via RESPIRATORY_TRACT
  Filled 2023-10-28 (×32): qty 2

## 2023-10-28 MED ORDER — THIAMINE HCL 100 MG/ML IJ SOLN
500.0000 mg | Freq: Three times a day (TID) | INTRAVENOUS | Status: AC
  Administered 2023-10-28 – 2023-10-31 (×9): 500 mg via INTRAVENOUS
  Filled 2023-10-28 (×9): qty 5

## 2023-10-28 MED ORDER — IPRATROPIUM-ALBUTEROL 0.5-2.5 (3) MG/3ML IN SOLN
3.0000 mL | Freq: Four times a day (QID) | RESPIRATORY_TRACT | Status: DC
  Administered 2023-10-28: 3 mL via RESPIRATORY_TRACT
  Filled 2023-10-28: qty 3

## 2023-10-28 MED ORDER — ROSUVASTATIN CALCIUM 20 MG PO TABS
20.0000 mg | ORAL_TABLET | Freq: Every day | ORAL | Status: DC
  Administered 2023-10-29 – 2024-06-05 (×221): 20 mg via ORAL
  Filled 2023-10-28 (×222): qty 1

## 2023-10-28 MED ORDER — THIAMINE MONONITRATE 100 MG PO TABS
100.0000 mg | ORAL_TABLET | Freq: Every day | ORAL | Status: DC
  Filled 2023-10-28 (×2): qty 1

## 2023-10-29 DIAGNOSIS — E512 Wernicke's encephalopathy: Secondary | ICD-10-CM | POA: Diagnosis present

## 2023-10-29 DIAGNOSIS — Z5900 Homelessness unspecified: Secondary | ICD-10-CM

## 2023-10-29 DIAGNOSIS — E66812 Obesity, class 2: Secondary | ICD-10-CM | POA: Diagnosis present

## 2023-10-29 LAB — GLUCOSE, CAPILLARY
Glucose-Capillary: 76 mg/dL (ref 70–99)
Glucose-Capillary: 83 mg/dL (ref 70–99)
Glucose-Capillary: 117 mg/dL — ABNORMAL HIGH (ref 70–99)
Glucose-Capillary: 93 mg/dL (ref 70–99)
Glucose-Capillary: 100 mg/dL — ABNORMAL HIGH (ref 70–99)

## 2023-10-29 LAB — BASIC METABOLIC PANEL WITH GFR
Anion gap: 12 (ref 5–15)
BUN: 30 mg/dL — ABNORMAL HIGH (ref 6–20)
CO2: 23 mmol/L (ref 22–32)
Calcium: 9.4 mg/dL (ref 8.9–10.3)
Chloride: 106 mmol/L (ref 98–111)
Creatinine, Ser: 1.25 mg/dL — ABNORMAL HIGH (ref 0.61–1.24)
GFR, Estimated: >60 mL/min (ref >60)
Glucose, Bld: 102 mg/dL — ABNORMAL HIGH (ref 70–99)
Potassium: 3.7 mmol/L (ref 3.5–5.1)
Sodium: 141 mmol/L (ref 135–145)

## 2023-10-29 LAB — CBC WITH DIFFERENTIAL/PLATELET
Abs Immature Granulocytes: 0 K/uL (ref 0.00–0.07)
Basophils Absolute: 0 K/uL (ref 0.0–0.1)
Basophils Relative: 0 %
Eosinophils Absolute: 0 K/uL (ref 0.0–0.5)
Eosinophils Relative: 0 %
HCT: 36.8 % — ABNORMAL LOW (ref 39.0–52.0)
Hemoglobin: 12.1 g/dL — ABNORMAL LOW (ref 13.0–17.0)
Lymphocytes Relative: 31 %
Lymphs Abs: 2.4 K/uL (ref 0.7–4.0)
MCH: 26.3 pg (ref 26.0–34.0)
MCHC: 32.9 g/dL (ref 30.0–36.0)
MCV: 80 fL (ref 80.0–100.0)
Monocytes Absolute: 0.3 K/uL (ref 0.1–1.0)
Monocytes Relative: 4 %
Neutro Abs: 5.1 K/uL (ref 1.7–7.7)
Neutrophils Relative %: 65 %
Platelets: 134 K/uL — ABNORMAL LOW (ref 150–400)
RBC: 4.6 MIL/uL (ref 4.22–5.81)
RDW: 15.8 % — ABNORMAL HIGH (ref 11.5–15.5)
WBC: 7.9 K/uL (ref 4.0–10.5)
nRBC: 0 % (ref 0.0–0.2)
nRBC: 0 /100{WBCs}

## 2023-10-29 LAB — MAGNESIUM: Magnesium: 1.9 mg/dL (ref 1.7–2.4)

## 2023-10-29 LAB — CULTURE, BLOOD (ROUTINE X 2): Culture: NO GROWTH

## 2023-10-29 LAB — PHOSPHORUS: Phosphorus: 4.4 mg/dL (ref 2.5–4.6)

## 2023-10-29 LAB — VITAMIN B1: Vitamin B1 (Thiamine): 106.1 nmol/L (ref 66.5–200.0)

## 2023-10-29 MED ORDER — INFLUENZA VIRUS VACC SPLIT PF (FLUZONE) 0.5 ML IM SUSY
0.5000 mL | PREFILLED_SYRINGE | INTRAMUSCULAR | Status: DC
  Filled 2023-10-29: qty 0.5

## 2023-10-29 MED ORDER — ORAL CARE MOUTH RINSE
15.0000 mL | OROMUCOSAL | Status: DC
  Administered 2023-10-29 – 2023-11-21 (×99): 15 mL via OROMUCOSAL

## 2023-10-29 MED ADMIN — Potassium Chloride Microencapsulated Crys ER Tab 20 mEq: 40 meq | ORAL | NDC 00245531989

## 2023-10-29 MED FILL — Potassium Chloride Microencapsulated Crys ER Tab 20 mEq: 40.0000 meq | ORAL | Qty: 2 | Status: AC

## 2023-10-29 NOTE — Progress Notes (Addendum)
 NEUROLOGY CONSULT FOLLOW UP NOTE   Date of service: October 29, 2023 Patient Name: Colton Lee MRN:  [id number] DOB:  10/28/1966  Brief HPI  Colton Lee is a 57 y.o. male  past medical history of limited access to healthcare and housing instability. He has had repeated presentations to the ED for gait instability, foot pain, tooth pain   Presented on 12/2 with 2 pain and foot pain.  Subsequently found to be hypothermic.  Noted to be incontinent about 4 AM on 12/3.  Somnolent around 10:30 AM on admitting hospitalist evaluation, by 4 PM no longer following commands.   He has been started on acyclovir , ceftriaxone  and azithromycin . Unrevealing metabolic and infectious workup, no record of any sedating medications administered in ED    Interval Hx/subjective   On NP Eval Extubated, speaking clearly. Lots of confusion, states we are in [place], speech is tangential. Difficulty managing silverware on breakfast tray but during evaluation hand grasp is strong and he is able to elevate extremities antigravity without drift.  Unable to identify fork, spoon, or knife- states they are speculums. Unable to tell me month, year, or DOB.    Vitals   Vitals:   10/29/23 0700 10/29/23 0727 10/29/23 0800 10/29/23 0803  BP: (!) 135/91  (!) 137/115   Pulse: 65  64   Resp: 15  17   Temp:  98.7 F (37.1 C)    TempSrc:  Oral    SpO2: 97%  96% 94%  Weight:      Height:         Body mass index is 37.46 kg/m.  Physical Exam   Constitutional: Appears ill Psych: Pleasantly confused Eyes: No scleral injection.  Head: Normocephalic.  Cardiovascular: Normal rate and regular rhythm.  Respiratory: Respirations synchronous with ventilator GI: Soft.  No distension. There is no tenderness.  Skin: Scaling dry skin of the bilateral lower extremities without obvious acute lesions   Neurologic Examination    Neuro: Mental Status: Alert to self, pleasant. Unable to identify common objects, unable to  state DOB, current month or year. States we are in [place]. Needs step by step instructions with re enforcement to use fork and spoon to eat breakfast. Continuously speaking prior to my entrance to the room.  Cranial Nerves: II: No blink to threat.  Right pupil postsurgical, left very small but reactive III,IV, VI/VIII: Control and instrumentation engineer with multiple prompts V/VII: Facial sensation is symmetric to eyelash brush VIII: hearing intact X/XI: palate elevates symmetrically XII: tongue protrudes symmetrically  Motor/Sensory: Tone normal.  Bulk appears normal.  Elevates all extremities antigravity  Cerebellar: FNF without ataxia, asterixis seems resolved on exam, difficulty completely HKS due to difficulty following multi step commands   On attending eval: Very similar exam, marked improvement in asterixis though still slightly present, marked improvement in dysarthria. Very tangential speech and loose associations.   Labs and Diagnostic Imaging    Ammonia 33 on 12/3 B1 106 on 12/4 B12 504 HIV neg RPR neg    CBC:  Recent Labs  Lab 10/28/23 0429 10/29/23 0223  WBC 4.1 7.9  NEUTROABS 3.1 5.1  HGB 12.8* 12.1*  HCT 39.2 36.8*  MCV 78.6* 80.0  PLT 108* 134*    Basic Metabolic Panel:  Lab Results  Component Value Date   NA 141 10/29/2023   K 3.7 10/29/2023   CO2 23 10/29/2023   GLUCOSE 102 (H) 10/29/2023   BUN 30 (H) 10/29/2023   CREATININE 1.25 (H) 10/29/2023   CALCIUM  9.4  10/29/2023   GFRNONAA >60 10/29/2023   Urine Drug Screen:     Component Value Date/Time   LABOPIA NONE DETECTED 10/26/2023 1326   COCAINSCRNUR NONE DETECTED 10/26/2023 1326   LABBENZ NONE DETECTED 10/26/2023 1326   AMPHETMU NONE DETECTED 10/26/2023 1326   THCU NONE DETECTED 10/26/2023 1326   LABBARB NONE DETECTED 10/26/2023 1326    Alcohol Level     Component Value Date/Time   ETH <10 10/23/2023 0611   INR  Lab Results  Component Value Date   INR 1.0 10/24/2023   APTT  Lab Results   Component Value Date   APTT 38 (H) 10/24/2023   LP  Glucose 53 (serum 94), Protein 96, RBC 4/1, WBC 0/0  meningitis panel negative Cryptococcal antigen negative Opening pressure of 44 -- while intubated   CT Head without contrast(Personally reviewed): 11/30 1. Stable and normal appearance of the brain. 2. Active left sinusitis.   12/3 1. No evidence of acute intracranial abnormality. 2. Persistent left-sided sinusitis.    MRI Brain 12/4 (Personally reviewed): 1. Intermittently motion degraded examination. 2. No evidence of an acute intracranial abnormality. The diffusion-weighted imaging is of good quality and there is no evidence of an acute infarct. 3. Mild multifocal T2 FLAIR hyperintense signal abnormality within the cerebral white matter, nonspecific but most often secondary to chronic small vessel ischemia. 4. 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 5. Paranasal sinus disease as described.    CTA, CTV 1. No acute intracranial abnormality. 2. Assessment is limited due to poor opacification of the arterial system. The distal vasculature in the bilateral MCA and ACA territories could not be assessed due to poor opacification. The cavernous and petrous segments of the bilateral internal carotid arteries could not be assessed due to streak artifact.  3. No evidence of dural venous sinus thrombosis. 4. No hemodynamically significant stenosis in the neck.  Neurodiagnostics EEG: 10/26/2023 Routine ABNORMALITY - Intermittent slow, generalized IMPRESSION: This study is suggestive of mild diffuse encephalopathy. No seizures or epileptiform discharges were seen throughout the recording. 10/26/2023 2325 to 10/27/2023 0830  - Continuous slow, generalized IMPRESSION: This study is suggestive of moderate to severe diffuse encephalopathy. No seizures or epileptiform discharges were seen throughout the recording.  Impression   Colton Lee is a 57 y.o. male with  worsening mental status without clear precipitant on review of toxic/metabolic processes and infectious workup to date unrevealing. LP benign within 24 hours of starting antibiotics.   Examination continues to markedly improve  Etiology of change in mentation remains unclear but gradually we have ruling out primary neurologic etiologies with negative MRI brain, reassuring EEG and reassuring CSF. He does have an elevated opening pressure of unclear etiology although at least some of the increased ICP could be secondary to intubation, body habitus or positioning.  Secondary causes of elevated intracranial pressure given reports of headache on prior ED visit ruled out with CTV although CTA was nondiagnostic do not feel the need to repeat CTA based on his clinical exam.  Isolated elevated protein in CSF is a nonspecific finding, for example can even be seen with degenerative disc disease etc.  For me his exam/progression over the last few days is not consistent with acute Wernicke's, nor are there classic MRI findings of this condition  Consider primary psychiatric etiology of his continued altered mental status  Recommendations  - No further inpatient neurological workup if he continues to improve - Please reach out if questions/concerns arise, neurology will sign off at  this time. Discussed with primary via secure chat on 12/7 and also discussed with Dr. Dennise on 12/8 with impression updated for clarity ______________________________________________________________________   Thank you for the opportunity to take part in the care of this patient. If you have any further questions, please contact the neurology consultation team on call. Updated oncall schedule is listed on AMION.  Signed,  Patient seen and examined by NP/APP with MD. MD to update note as needed.   Jorene Last, DNP, FNP-BC [facility] Pager: [phone]  Attending Neurologist's note:  I personally saw this  patient, gathering history, performing a full neurologic examination, reviewing relevant labs, personally reviewing relevant imaging including CTA and CTV, and formulated the assessment and plan, adding the note above for completeness and clarity to accurately reflect my thoughts  Lola Jernigan MD-PhD [facility] [phone] Available 7 AM to 7 PM, outside these hours please contact Neurologist on call listed on AMION

## 2023-10-30 ENCOUNTER — Inpatient Hospital Stay (HOSPITAL_COMMUNITY): Payer: Self-pay

## 2023-10-30 LAB — CSF CULTURE W GRAM STAIN
Culture: NO GROWTH
Gram Stain: NONE SEEN

## 2023-10-30 LAB — CBC WITH DIFFERENTIAL/PLATELET
Abs Immature Granulocytes: 0.03 K/uL (ref 0.00–0.07)
Basophils Absolute: 0 K/uL (ref 0.0–0.1)
Basophils Relative: 0 %
Eosinophils Absolute: 0.1 K/uL (ref 0.0–0.5)
Eosinophils Relative: 2 %
HCT: 37.2 % — ABNORMAL LOW (ref 39.0–52.0)
Hemoglobin: 12 g/dL — ABNORMAL LOW (ref 13.0–17.0)
Immature Granulocytes: 1 %
Lymphocytes Relative: 34 %
Lymphs Abs: 1.7 K/uL (ref 0.7–4.0)
MCH: 26.1 pg (ref 26.0–34.0)
MCHC: 32.3 g/dL (ref 30.0–36.0)
MCV: 80.9 fL (ref 80.0–100.0)
Monocytes Absolute: 0.6 K/uL (ref 0.1–1.0)
Monocytes Relative: 11 %
Neutro Abs: 2.6 K/uL (ref 1.7–7.7)
Neutrophils Relative %: 52 %
Platelets: 140 K/uL — ABNORMAL LOW (ref 150–400)
RBC: 4.6 MIL/uL (ref 4.22–5.81)
RDW: 16.1 % — ABNORMAL HIGH (ref 11.5–15.5)
WBC: 4.9 K/uL (ref 4.0–10.5)
nRBC: 0 % (ref 0.0–0.2)

## 2023-10-30 LAB — CULTURE, BLOOD (ROUTINE X 2): Culture: NO GROWTH

## 2023-10-30 LAB — RETICULOCYTES
Immature Retic Fract: 24.8 % — ABNORMAL HIGH (ref 2.3–15.9)
RBC.: 4.62 MIL/uL (ref 4.22–5.81)
Retic Count, Absolute: 38.3 K/uL (ref 19.0–186.0)
Retic Ct Pct: 0.8 % (ref 0.4–3.1)

## 2023-10-30 LAB — GLUCOSE, CAPILLARY
Glucose-Capillary: 92 mg/dL (ref 70–99)
Glucose-Capillary: 84 mg/dL (ref 70–99)
Glucose-Capillary: 83 mg/dL (ref 70–99)
Glucose-Capillary: 115 mg/dL — ABNORMAL HIGH (ref 70–99)
Glucose-Capillary: 96 mg/dL (ref 70–99)
Glucose-Capillary: 126 mg/dL — ABNORMAL HIGH (ref 70–99)

## 2023-10-30 LAB — IRON AND TIBC
Iron: 68 ug/dL (ref 45–182)
Saturation Ratios: 18 % (ref 17.9–39.5)
TIBC: 381 ug/dL (ref 250–450)
UIBC: 313 ug/dL

## 2023-10-30 LAB — MRSA NEXT GEN BY PCR, NASAL: MRSA by PCR Next Gen: NOT DETECTED

## 2023-10-30 LAB — MAGNESIUM: Magnesium: 2 mg/dL (ref 1.7–2.4)

## 2023-10-30 LAB — FERRITIN: Ferritin: 169 ng/mL (ref 24–336)

## 2023-10-30 LAB — VITAMIN B12: Vitamin B-12: 496 pg/mL (ref 180–914)

## 2023-10-30 LAB — PROCALCITONIN: Procalcitonin: <0.1 ng/mL

## 2023-10-30 LAB — FOLATE: Folate: 11.6 ng/mL (ref >5.9)

## 2023-10-30 LAB — C-REACTIVE PROTEIN: CRP: 2 mg/dL — ABNORMAL HIGH (ref <1.0)

## 2023-10-30 LAB — TSH: TSH: 3.705 u[IU]/mL (ref 0.350–4.500)

## 2023-10-30 LAB — CORTISOL: Cortisol, Plasma: 10.3 ug/dL

## 2023-10-30 LAB — AMMONIA: Ammonia: 29 umol/L (ref 9–35)

## 2023-10-30 LAB — PHOSPHORUS: Phosphorus: 3.8 mg/dL (ref 2.5–4.6)

## 2023-10-30 LAB — BRAIN NATRIURETIC PEPTIDE: B Natriuretic Peptide: 43.1 pg/mL (ref 0.0–100.0)

## 2023-10-30 MED ORDER — PANTOPRAZOLE SODIUM 40 MG PO TBEC
40.0000 mg | DELAYED_RELEASE_TABLET | Freq: Every day | ORAL | Status: DC
  Administered 2023-10-30 – 2024-06-05 (×220): 40 mg via ORAL
  Filled 2023-10-30 (×220): qty 1

## 2023-10-30 MED ORDER — QUETIAPINE FUMARATE 25 MG PO TABS
25.0000 mg | ORAL_TABLET | Freq: Two times a day (BID) | ORAL | Status: DC
  Administered 2023-10-30 – 2023-11-01 (×5): 25 mg via ORAL
  Filled 2023-10-30 (×5): qty 1

## 2023-10-31 ENCOUNTER — Inpatient Hospital Stay (HOSPITAL_COMMUNITY): Payer: Self-pay

## 2023-10-31 LAB — OSMOLALITY: Osmolality: 308 mosm/kg — ABNORMAL HIGH (ref 275–295)

## 2023-10-31 LAB — CBC WITH DIFFERENTIAL/PLATELET
Abs Immature Granulocytes: 0.03 K/uL (ref 0.00–0.07)
Basophils Absolute: 0 K/uL (ref 0.0–0.1)
Basophils Relative: 0 %
Eosinophils Absolute: 0.1 K/uL (ref 0.0–0.5)
Eosinophils Relative: 2 %
HCT: 40.1 % (ref 39.0–52.0)
Hemoglobin: 12.8 g/dL — ABNORMAL LOW (ref 13.0–17.0)
Immature Granulocytes: 1 %
Lymphocytes Relative: 40 %
Lymphs Abs: 2.1 K/uL (ref 0.7–4.0)
MCH: 26.1 pg (ref 26.0–34.0)
MCHC: 31.9 g/dL (ref 30.0–36.0)
MCV: 81.8 fL (ref 80.0–100.0)
Monocytes Absolute: 0.6 K/uL (ref 0.1–1.0)
Monocytes Relative: 11 %
Neutro Abs: 2.5 K/uL (ref 1.7–7.7)
Neutrophils Relative %: 46 %
Platelets: 174 K/uL (ref 150–400)
RBC: 4.9 MIL/uL (ref 4.22–5.81)
RDW: 16 % — ABNORMAL HIGH (ref 11.5–15.5)
WBC: 5.3 K/uL (ref 4.0–10.5)
nRBC: 0 % (ref 0.0–0.2)

## 2023-10-31 LAB — BASIC METABOLIC PANEL WITH GFR
Anion gap: 10 (ref 5–15)
BUN: 17 mg/dL (ref 6–20)
CO2: 24 mmol/L (ref 22–32)
Calcium: 9.1 mg/dL (ref 8.9–10.3)
Chloride: 106 mmol/L (ref 98–111)
Creatinine, Ser: 1.32 mg/dL — ABNORMAL HIGH (ref 0.61–1.24)
GFR, Estimated: >60 mL/min (ref >60)
Glucose, Bld: 98 mg/dL (ref 70–99)
Potassium: 3.9 mmol/L (ref 3.5–5.1)
Sodium: 140 mmol/L (ref 135–145)

## 2023-10-31 LAB — OSMOLALITY, URINE: Osmolality, Ur: 833 mosm/kg (ref 300–900)

## 2023-10-31 LAB — URIC ACID: Uric Acid, Serum: 8.1 mg/dL (ref 3.7–8.6)

## 2023-10-31 LAB — CREATININE, URINE, RANDOM: Creatinine, Urine: 156 mg/dL

## 2023-10-31 LAB — PHOSPHORUS: Phosphorus: 3.7 mg/dL (ref 2.5–4.6)

## 2023-10-31 LAB — GLUCOSE, CAPILLARY
Glucose-Capillary: 173 mg/dL — ABNORMAL HIGH (ref 70–99)
Glucose-Capillary: 113 mg/dL — ABNORMAL HIGH (ref 70–99)
Glucose-Capillary: 104 mg/dL — ABNORMAL HIGH (ref 70–99)

## 2023-10-31 LAB — PROCALCITONIN: Procalcitonin: <0.1 ng/mL

## 2023-10-31 LAB — MAGNESIUM: Magnesium: 2.2 mg/dL (ref 1.7–2.4)

## 2023-10-31 LAB — BRAIN NATRIURETIC PEPTIDE: B Natriuretic Peptide: 24.1 pg/mL (ref 0.0–100.0)

## 2023-10-31 LAB — C-REACTIVE PROTEIN: CRP: 1.2 mg/dL — ABNORMAL HIGH (ref <1.0)

## 2023-10-31 LAB — SODIUM, URINE, RANDOM: Sodium, Ur: 192 mmol/L

## 2023-10-31 MED ORDER — LORAZEPAM 1 MG PO TABS: 1.0000 mg | ORAL_TABLET | Freq: Three times a day (TID) | ORAL | Status: DC

## 2023-10-31 MED ORDER — LORAZEPAM 1 MG PO TABS: 1.0000 mg | ORAL_TABLET | Freq: Two times a day (BID) | ORAL | Status: DC

## 2023-10-31 MED ORDER — LORAZEPAM 1 MG PO TABS: 1.0000 mg | ORAL_TABLET | Freq: Once | ORAL | Status: DC

## 2023-10-31 MED ORDER — AMOXICILLIN-POT CLAVULANATE 875-125 MG PO TABS
1.0000 | ORAL_TABLET | Freq: Two times a day (BID) | ORAL | Status: AC
  Administered 2023-10-31 – 2023-11-01 (×3): 1 via ORAL
  Filled 2023-10-31 (×3): qty 1

## 2023-10-31 MED ORDER — LORAZEPAM 1 MG PO TABS
1.0000 mg | ORAL_TABLET | Freq: Four times a day (QID) | ORAL | Status: DC
  Administered 2023-10-31 (×3): 1 mg via ORAL
  Filled 2023-10-31 (×3): qty 1

## 2023-10-31 MED ORDER — THIAMINE MONONITRATE 100 MG PO TABS
100.0000 mg | ORAL_TABLET | Freq: Every day | ORAL | Status: DC
  Administered 2023-10-31 – 2023-11-01 (×2): 100 mg via ORAL
  Filled 2023-10-31 (×2): qty 1

## 2023-10-31 MED ORDER — LACTATED RINGERS IV SOLN: INTRAVENOUS | Status: DC

## 2023-10-31 MED ORDER — ADULT MULTIVITAMIN W/MINERALS CH
1.0000 | ORAL_TABLET | Freq: Every day | ORAL | Status: DC
  Administered 2023-10-31 – 2024-06-05 (×219): 1 via ORAL
  Filled 2023-10-31 (×219): qty 1

## 2023-10-31 MED ORDER — FOLIC ACID 1 MG PO TABS
1.0000 mg | ORAL_TABLET | Freq: Every day | ORAL | Status: DC
  Administered 2023-10-31 – 2024-06-05 (×219): 1 mg via ORAL
  Filled 2023-10-31 (×218): qty 1

## 2023-11-01 DIAGNOSIS — F259 Schizoaffective disorder, unspecified: Secondary | ICD-10-CM

## 2023-11-01 LAB — CBC WITH DIFFERENTIAL/PLATELET
Abs Immature Granulocytes: 0.03 K/uL (ref 0.00–0.07)
Basophils Absolute: 0 K/uL (ref 0.0–0.1)
Basophils Relative: 0 %
Eosinophils Absolute: 0.1 K/uL (ref 0.0–0.5)
Eosinophils Relative: 3 %
HCT: 38.7 % — ABNORMAL LOW (ref 39.0–52.0)
Hemoglobin: 12.2 g/dL — ABNORMAL LOW (ref 13.0–17.0)
Immature Granulocytes: 1 %
Lymphocytes Relative: 38 %
Lymphs Abs: 2 K/uL (ref 0.7–4.0)
MCH: 26.2 pg (ref 26.0–34.0)
MCHC: 31.5 g/dL (ref 30.0–36.0)
MCV: 83 fL (ref 80.0–100.0)
Monocytes Absolute: 0.4 K/uL (ref 0.1–1.0)
Monocytes Relative: 8 %
Neutro Abs: 2.7 K/uL (ref 1.7–7.7)
Neutrophils Relative %: 50 %
Platelets: 174 K/uL (ref 150–400)
RBC: 4.66 MIL/uL (ref 4.22–5.81)
RDW: 16.1 % — ABNORMAL HIGH (ref 11.5–15.5)
WBC: 5.2 K/uL (ref 4.0–10.5)
nRBC: 0 % (ref 0.0–0.2)

## 2023-11-01 LAB — PHOSPHORUS: Phosphorus: 3.8 mg/dL (ref 2.5–4.6)

## 2023-11-01 LAB — BASIC METABOLIC PANEL WITH GFR
Anion gap: 8 (ref 5–15)
BUN: 15 mg/dL (ref 6–20)
CO2: 27 mmol/L (ref 22–32)
Calcium: 9 mg/dL (ref 8.9–10.3)
Chloride: 107 mmol/L (ref 98–111)
Creatinine, Ser: 1.19 mg/dL (ref 0.61–1.24)
GFR, Estimated: >60 mL/min (ref >60)
Glucose, Bld: 95 mg/dL (ref 70–99)
Potassium: 3.9 mmol/L (ref 3.5–5.1)
Sodium: 142 mmol/L (ref 135–145)

## 2023-11-01 LAB — GLUCOSE, CAPILLARY
Glucose-Capillary: 104 mg/dL — ABNORMAL HIGH (ref 70–99)
Glucose-Capillary: 114 mg/dL — ABNORMAL HIGH (ref 70–99)
Glucose-Capillary: 86 mg/dL (ref 70–99)
Glucose-Capillary: 90 mg/dL (ref 70–99)
Glucose-Capillary: 98 mg/dL (ref 70–99)

## 2023-11-01 LAB — BRAIN NATRIURETIC PEPTIDE: B Natriuretic Peptide: 17.8 pg/mL (ref 0.0–100.0)

## 2023-11-01 LAB — PROCALCITONIN: Procalcitonin: <0.1 ng/mL

## 2023-11-01 LAB — C-REACTIVE PROTEIN: CRP: 0.7 mg/dL (ref <1.0)

## 2023-11-01 LAB — MAGNESIUM: Magnesium: 2 mg/dL (ref 1.7–2.4)

## 2023-11-01 MED ORDER — RISPERIDONE 1 MG PO TABS
1.0000 mg | ORAL_TABLET | Freq: Two times a day (BID) | ORAL | Status: DC
  Administered 2023-11-01: 1 mg via ORAL
  Filled 2023-11-01 (×2): qty 1

## 2023-11-02 LAB — BASIC METABOLIC PANEL WITH GFR
Anion gap: 5 (ref 5–15)
BUN: 15 mg/dL (ref 6–20)
CO2: 24 mmol/L (ref 22–32)
Calcium: 8.7 mg/dL — ABNORMAL LOW (ref 8.9–10.3)
Chloride: 107 mmol/L (ref 98–111)
Creatinine, Ser: 1.11 mg/dL (ref 0.61–1.24)
GFR, Estimated: >60 mL/min (ref >60)
Glucose, Bld: 93 mg/dL (ref 70–99)
Potassium: 3.7 mmol/L (ref 3.5–5.1)
Sodium: 136 mmol/L (ref 135–145)

## 2023-11-02 LAB — CBC WITH DIFFERENTIAL/PLATELET
Abs Immature Granulocytes: 0.03 K/uL (ref 0.00–0.07)
Basophils Absolute: 0 K/uL (ref 0.0–0.1)
Basophils Relative: 0 %
Eosinophils Absolute: 0.2 K/uL (ref 0.0–0.5)
Eosinophils Relative: 3 %
HCT: 38.2 % — ABNORMAL LOW (ref 39.0–52.0)
Hemoglobin: 12 g/dL — ABNORMAL LOW (ref 13.0–17.0)
Immature Granulocytes: 1 %
Lymphocytes Relative: 31 %
Lymphs Abs: 1.7 K/uL (ref 0.7–4.0)
MCH: 26 pg (ref 26.0–34.0)
MCHC: 31.4 g/dL (ref 30.0–36.0)
MCV: 82.7 fL (ref 80.0–100.0)
Monocytes Absolute: 0.4 K/uL (ref 0.1–1.0)
Monocytes Relative: 6 %
Neutro Abs: 3.3 K/uL (ref 1.7–7.7)
Neutrophils Relative %: 59 %
Platelets: 233 K/uL (ref 150–400)
RBC: 4.62 MIL/uL (ref 4.22–5.81)
RDW: 15.9 % — ABNORMAL HIGH (ref 11.5–15.5)
WBC: 5.5 K/uL (ref 4.0–10.5)
nRBC: 0 % (ref 0.0–0.2)

## 2023-11-02 LAB — GLUCOSE, CAPILLARY
Glucose-Capillary: 84 mg/dL (ref 70–99)
Glucose-Capillary: 117 mg/dL — ABNORMAL HIGH (ref 70–99)
Glucose-Capillary: 141 mg/dL — ABNORMAL HIGH (ref 70–99)
Glucose-Capillary: 113 mg/dL — ABNORMAL HIGH (ref 70–99)
Glucose-Capillary: 185 mg/dL — ABNORMAL HIGH (ref 70–99)

## 2023-11-02 LAB — PHOSPHORUS: Phosphorus: 3.4 mg/dL (ref 2.5–4.6)

## 2023-11-02 LAB — MAGNESIUM: Magnesium: 2.3 mg/dL (ref 1.7–2.4)

## 2023-11-02 LAB — C-REACTIVE PROTEIN: CRP: 0.6 mg/dL (ref <1.0)

## 2023-11-02 LAB — BRAIN NATRIURETIC PEPTIDE: B Natriuretic Peptide: 17.4 pg/mL (ref 0.0–100.0)

## 2023-11-02 LAB — PROCALCITONIN: Procalcitonin: <0.1 ng/mL

## 2023-11-02 MED ORDER — RISPERIDONE 2 MG PO TABS: 2.0000 mg | ORAL_TABLET | Freq: Two times a day (BID) | ORAL | Status: DC

## 2023-11-02 MED ORDER — THIAMINE MONONITRATE 100 MG PO TABS
100.0000 mg | ORAL_TABLET | Freq: Every day | ORAL | Status: DC
  Administered 2023-11-02 – 2024-06-05 (×217): 100 mg via ORAL
  Filled 2023-11-02 (×217): qty 1

## 2023-11-02 MED ORDER — RISPERIDONE 1 MG PO TABS
1.0000 mg | ORAL_TABLET | Freq: Two times a day (BID) | ORAL | Status: DC
Start: 2023-11-02 — End: 2023-11-03
  Administered 2023-11-02 – 2023-11-03 (×2): 1 mg via ORAL
  Filled 2023-11-02 (×2): qty 1

## 2023-11-02 MED ORDER — RISPERIDONE 1 MG PO TABS
1.0000 mg | ORAL_TABLET | Freq: Two times a day (BID) | ORAL | Status: AC
  Administered 2023-11-02: 1 mg via ORAL
  Filled 2023-11-02: qty 1

## 2023-11-02 MED ORDER — THIAMINE HCL 100 MG/ML IJ SOLN: 500.0000 mg | Freq: Three times a day (TID) | INTRAVENOUS | Status: DC

## 2023-11-03 LAB — CBC WITH DIFFERENTIAL/PLATELET
Abs Immature Granulocytes: 0.03 K/uL (ref 0.00–0.07)
Basophils Absolute: 0 K/uL (ref 0.0–0.1)
Basophils Relative: 0 %
Eosinophils Absolute: 0.2 K/uL (ref 0.0–0.5)
Eosinophils Relative: 3 %
HCT: 39.5 % (ref 39.0–52.0)
Hemoglobin: 12.3 g/dL — ABNORMAL LOW (ref 13.0–17.0)
Immature Granulocytes: 1 %
Lymphocytes Relative: 32 %
Lymphs Abs: 1.9 K/uL (ref 0.7–4.0)
MCH: 26 pg (ref 26.0–34.0)
MCHC: 31.1 g/dL (ref 30.0–36.0)
MCV: 83.5 fL (ref 80.0–100.0)
Monocytes Absolute: 0.3 K/uL (ref 0.1–1.0)
Monocytes Relative: 6 %
Neutro Abs: 3.6 K/uL (ref 1.7–7.7)
Neutrophils Relative %: 58 %
Platelets: 310 K/uL (ref 150–400)
RBC: 4.73 MIL/uL (ref 4.22–5.81)
RDW: 16 % — ABNORMAL HIGH (ref 11.5–15.5)
WBC: 6.1 K/uL (ref 4.0–10.5)
nRBC: 0 % (ref 0.0–0.2)

## 2023-11-03 LAB — BASIC METABOLIC PANEL WITH GFR
Anion gap: 10 (ref 5–15)
BUN: 18 mg/dL (ref 6–20)
CO2: 25 mmol/L (ref 22–32)
Calcium: 9.3 mg/dL (ref 8.9–10.3)
Chloride: 106 mmol/L (ref 98–111)
Creatinine, Ser: 1.08 mg/dL (ref 0.61–1.24)
GFR, Estimated: >60 mL/min (ref >60)
Glucose, Bld: 97 mg/dL (ref 70–99)
Potassium: 3.9 mmol/L (ref 3.5–5.1)
Sodium: 141 mmol/L (ref 135–145)

## 2023-11-03 LAB — PROCALCITONIN: Procalcitonin: <0.1 ng/mL

## 2023-11-03 LAB — GLUCOSE, CAPILLARY
Glucose-Capillary: 103 mg/dL — ABNORMAL HIGH (ref 70–99)
Glucose-Capillary: 122 mg/dL — ABNORMAL HIGH (ref 70–99)
Glucose-Capillary: 132 mg/dL — ABNORMAL HIGH (ref 70–99)
Glucose-Capillary: 133 mg/dL — ABNORMAL HIGH (ref 70–99)
Glucose-Capillary: 151 mg/dL — ABNORMAL HIGH (ref 70–99)
Glucose-Capillary: 79 mg/dL (ref 70–99)

## 2023-11-03 LAB — PHOSPHORUS: Phosphorus: 3.4 mg/dL (ref 2.5–4.6)

## 2023-11-03 LAB — BRAIN NATRIURETIC PEPTIDE: B Natriuretic Peptide: 17.4 pg/mL (ref 0.0–100.0)

## 2023-11-03 LAB — C-REACTIVE PROTEIN: CRP: 0.6 mg/dL (ref <1.0)

## 2023-11-03 LAB — MAGNESIUM: Magnesium: 2.2 mg/dL (ref 1.7–2.4)

## 2023-11-03 MED ORDER — RISPERIDONE 2 MG PO TABS
2.0000 mg | ORAL_TABLET | Freq: Two times a day (BID) | ORAL | Status: DC
  Administered 2023-11-03 – 2023-11-04 (×2): 2 mg via ORAL
  Filled 2023-11-03 (×3): qty 1

## 2023-11-03 MED ORDER — RISPERIDONE 1 MG PO TABS
1.0000 mg | ORAL_TABLET | Freq: Once | ORAL | Status: AC
  Administered 2023-11-03: 1 mg via ORAL
  Filled 2023-11-03: qty 1

## 2023-11-04 LAB — GLUCOSE, CAPILLARY
Glucose-Capillary: 106 mg/dL — ABNORMAL HIGH (ref 70–99)
Glucose-Capillary: 111 mg/dL — ABNORMAL HIGH (ref 70–99)
Glucose-Capillary: 139 mg/dL — ABNORMAL HIGH (ref 70–99)
Glucose-Capillary: 77 mg/dL (ref 70–99)
Glucose-Capillary: 91 mg/dL (ref 70–99)
Glucose-Capillary: 92 mg/dL (ref 70–99)
Glucose-Capillary: 98 mg/dL (ref 70–99)

## 2023-11-04 MED ORDER — RISPERIDONE 3 MG PO TABS
3.0000 mg | ORAL_TABLET | Freq: Every day | ORAL | Status: DC
  Administered 2023-11-04: 3 mg via ORAL
  Filled 2023-11-04 (×2): qty 1

## 2023-11-04 MED ORDER — RISPERIDONE 3 MG PO TABS: 3.0000 mg | ORAL_TABLET | Freq: Every day | ORAL | Status: DC

## 2023-11-04 MED FILL — Emollient - Lotion: CUTANEOUS | Qty: 473 | Status: AC

## 2023-11-04 MED FILL — Emollient - Lotion: CUTANEOUS | Qty: 473 | Status: CN

## 2023-11-05 LAB — GLUCOSE, CAPILLARY
Glucose-Capillary: 102 mg/dL — ABNORMAL HIGH (ref 70–99)
Glucose-Capillary: 120 mg/dL — ABNORMAL HIGH (ref 70–99)
Glucose-Capillary: 125 mg/dL — ABNORMAL HIGH (ref 70–99)

## 2023-11-05 MED ORDER — RISPERIDONE 1 MG PO TABS
1.0000 mg | ORAL_TABLET | Freq: Every day | ORAL | Status: DC
  Administered 2023-11-05 – 2023-11-06 (×2): 1 mg via ORAL
  Filled 2023-11-05 (×3): qty 1

## 2023-11-06 DIAGNOSIS — R419 Unspecified symptoms and signs involving cognitive functions and awareness: Secondary | ICD-10-CM

## 2023-11-06 LAB — GLUCOSE, CAPILLARY
Glucose-Capillary: 85 mg/dL (ref 70–99)
Glucose-Capillary: 122 mg/dL — ABNORMAL HIGH (ref 70–99)

## 2023-11-07 LAB — GLUCOSE, CAPILLARY
Glucose-Capillary: 96 mg/dL (ref 70–99)
Glucose-Capillary: 139 mg/dL — ABNORMAL HIGH (ref 70–99)

## 2023-11-08 DIAGNOSIS — F04 Amnestic disorder due to known physiological condition: Secondary | ICD-10-CM | POA: Insufficient documentation

## 2023-11-09 NOTE — Progress Notes (Addendum)
 PROGRESS NOTE        PATIENT DETAILS Name: Colton Lee Age: 57 y.o. Sex: male Date of Birth: [birth year] Admit Date: 10/24/2023 Admitting Physician Russella Dar, NP PCP:Pcp, No  Brief Summary: Patient is a 57 y.o.  male with history of suspected peripheral neuropathy-who presented to the ED on 12/2 with bilateral leg pain/tooth pain-presented with hypothermia/altered mental status.  Underwent extensive workup including neuroimaging/CSF analysis/LTM EEG-all unrevealing.  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  Psychiatry was then consulted for concern of possible underlying schizophrenia-he was started on Risperdal without any improvement-and in fact developed adverse effects like bradykinesia/rigidity/masked facies.  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  He is felt not to have capacity-he is homeless-TOC following-APS involved for guardianship process-with plans to ultimately transfer to SNF.  Significant events: 12/3>> hypothermic-somnolent-mild hypercarbia-started on BiPAP-admit to [facility] 12/4>> liberated on BiPAP.  Given Ativan for attempted lumbar puncture-subsequently became more somnolent-transferred to ICU-intubated. 12/5>> LP by IR (CSF WBC 0, protein 96) 12/6>> extubated 12/7>> transfer back to [facility] 12/10>> Dr. 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  Significant studies: 12/2>> CXR: Bibasilar atelectasis/infiltrate 12/2>> CT head: No acute intracranial abnormality-left-sided sinusitis 12/3>> NH4: Within normal limit 12/3>> HIV: Negative 12/4>> vitamin B1: 106.1 (normal) 12/4>> vitamin B12: 504 12/4>> RPR: Pending 12/4>> MRI brain: No acute CVA/intracranial abnormality. 12 4-12/5>>  LTM EEG: Negative 12/6>> CTA head/neck: No LVO/hemodynamically significant stenosis-but motion artifact study. 12/6>> CT venogram head: No sinus thrombosis 12/8>> TSH: Normal limit 12/8>> a.m. cortisol: 10.3  Significant microbiology data: 12/2>> COVID/influenza/RSV PCR: Negative 12/2>> blood culture: Negative 12/3>> blood culture: Negative 12/3>> respiratory virus panel: Negative 12/3>> urine culture: Negative 12/4>> tracheal aspirate: Normal flora 12/4>> CSF culture: Negative 12/4>> CSF meningitis/encephalitis panel: Negative  Procedures: 12/4-12/6>> ETT 12/5>> LP under fluoroscopy  Consults: PCCM Neurology Psych   Subjective: Lying comfortably in bed-answer simple questions occasionally.  Speech is fluent.  Objective: Vitals: Blood pressure 135/82, pulse (!) 57, temperature 97.7 F (36.5 C), temperature source Oral, resp. rate 14, height 6\' 5"  (1.956 m), weight 134 kg, SpO2 95%.   Exam: Gen Exam:not in any distress HEENT:atraumatic, normocephalic Chest: B/L clear to auscultation anteriorly CVS:S1S2 regular Abdomen:soft non tender, non distended Extremities:no edema Neurology: Non focal Skin: no rash  Pertinent Labs/Radiology:    Latest Ref Rng & Units 11/03/2023    5:07 AM 11/02/2023    5:40 AM 11/01/2023    4:36 AM  CBC  WBC 4.0 - 10.5 K/uL 6.1  5.5  5.2   Hemoglobin 13.0 - 17.0 g/dL 96.2  95.2  84.1   Hematocrit 39.0 - 52.0 % 39.5  38.2  38.7   Platelets 150 - 400 K/uL 310  233  174     Lab Results  Component Value Date   NA 141 11/03/2023   K 3.9 11/03/2023   CL 106 11/03/2023   CO2 25 11/03/2023      Assessment/Plan: Acute metabolic encephalopathy Extensive workup negative 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  Hypothermia Due to  environment/cold exposure TSH/a.m.  cortisol stable Sepsis/infection ruled out-all cultures negative  Acute hypercarbic/hypoxic respiratory failure Aspiration pneumonia Secondary to somnolence/Ativan use Intubated 12/4-12/6-currently stable on room air Completed a course of Augmentin on 12/11  AKI Hemodynamically mediated Resolved  Homelessness TOC following SNF planned  BMI: Estimated body mass index is 35.03 kg/m as calculated from the following:   Height as of this encounter: 6\' 5"  (1.956 m).   Weight as of this encounter: 134 kg.    Code status:   Code Status: Full Code   DVT Prophylaxis: SQ Lovenox   Family Communication: None at bedside  Disposition Plan: Status is: Inpatient Remains inpatient appropriate because: Severity of illness   Planned Discharge Destination:SNF   Diet: Diet Order             Diet regular Room service appropriate? Yes with Assist; Fluid consistency: Thin  Diet effective now                     Antimicrobial agents: Anti-infectives (From admission, onward)    Start     Dose/Rate Route Frequency Ordered Stop   10/31/23 2200  amoxicillin-clavulanate (AUGMENTIN) 875-125 MG per tablet 1 tablet        1 tablet Oral Every 12 hours 10/31/23 1355 11/01/23 2158   10/28/23 1200  cefTRIAXone (ROCEPHIN) 2 g in sodium chloride 0.9 % 100 mL IVPB  Status:  Discontinued        2 g 200 mL/hr over 30 Minutes Intravenous Every 24 hours 10/27/23 1402 10/28/23 1049   10/28/23 1145  Ampicillin-Sulbactam (UNASYN) 3 g in sodium chloride 0.9 % 100 mL IVPB  Status:  Discontinued        3 g 200 mL/hr over 30 Minutes Intravenous Every 6 hours 10/28/23 1049 10/31/23 1355   10/27/23 0500  cefTRIAXone (ROCEPHIN) 2 g in sodium chloride 0.9 % 100 mL IVPB  Status:  Discontinued        2 g 200 mL/hr over 30 Minutes Intravenous Every 24 hours 10/26/23 1015 10/26/23 1103   10/27/23 0200  vancomycin (VANCOCIN) IVPB 1000 mg/200 mL premix  Status:   Discontinued        1,000 mg 200 mL/hr over 60 Minutes Intravenous Every 12 hours 10/26/23 1136 10/27/23 1402   10/26/23 1500  vancomycin (VANCOCIN) IVPB 1000 mg/200 mL premix       Placed in "Followed by" Linked Group   1,000 mg 200 mL/hr over 60 Minutes Intravenous  Once 10/26/23 1252 10/26/23 1651   10/26/23 1400  vancomycin (VANCOCIN) IVPB 1000 mg/200 mL premix       Placed in "Followed by" Linked Group   1,000 mg 200 mL/hr over 60 Minutes Intravenous  Once 10/26/23 1252 10/26/23 1923   10/26/23 1230  acyclovir (ZOVIRAX) 990 mg in dextrose 5 % 250 mL IVPB  Status:  Discontinued        10 mg/kg  98.8 kg (Adjusted) 269.8 mL/hr over 60 Minutes Intravenous Every 8 hours 10/26/23 1136 10/27/23 1402   10/26/23 1230  vancomycin (VANCOREADY) IVPB 2000 mg/400 mL  Status:  Discontinued        2,000 mg 200 mL/hr over 120 Minutes Intravenous  Once 10/26/23 1136 10/26/23 1252   10/26/23 1200  cefTRIAXone (ROCEPHIN) 2 g in sodium chloride 0.9 % 100 mL IVPB  Status:  Discontinued        2 g 200 mL/hr over 30 Minutes Intravenous Every 12 hours 10/26/23 1103 10/27/23 1402   10/26/23 1200  ampicillin (OMNIPEN) 2  g in sodium chloride 0.9 % 100 mL IVPB  Status:  Discontinued        2 g 300 mL/hr over 20 Minutes Intravenous Every 4 hours 10/26/23 1105 10/27/23 1402   10/26/23 1115  cefTRIAXone (ROCEPHIN) 1 g in sodium chloride 0.9 % 100 mL IVPB  Status:  Discontinued        1 g 200 mL/hr over 30 Minutes Intravenous  Once 10/26/23 1016 10/26/23 1103   10/26/23 0500  azithromycin (ZITHROMAX) 500 mg in sodium chloride 0.9 % 250 mL IVPB  Status:  Discontinued        500 mg 250 mL/hr over 60 Minutes Intravenous Every 24 hours 10/25/23 0840 10/26/23 1102   10/26/23 0500  cefTRIAXone (ROCEPHIN) 1 g in sodium chloride 0.9 % 100 mL IVPB  Status:  Discontinued        1 g 200 mL/hr over 30 Minutes Intravenous Every 24 hours 10/25/23 0840 10/26/23 1015   10/25/23 0400  cefTRIAXone (ROCEPHIN) 1 g in sodium  chloride 0.9 % 100 mL IVPB        1 g 200 mL/hr over 30 Minutes Intravenous  Once 10/25/23 0356 10/25/23 0459   10/25/23 0400  azithromycin (ZITHROMAX) 500 mg in sodium chloride 0.9 % 250 mL IVPB        500 mg 250 mL/hr over 60 Minutes Intravenous  Once 10/25/23 0356 10/25/23 0708        MEDICATIONS: Scheduled Meds:  arformoterol  15 mcg Nebulization BID   budesonide (PULMICORT) nebulizer solution  0.5 mg Nebulization BID   cetaphil   Topical Daily   enoxaparin (LOVENOX) injection  70 mg Subcutaneous Q24H   feeding supplement  237 mL Oral BID BM   folic acid  1 mg Oral Daily   influenza vac split trivalent PF  0.5 mL Intramuscular Tomorrow-1000   multivitamin with minerals  1 tablet Oral Daily   mouth rinse  15 mL Mouth Rinse Q4H   pantoprazole  40 mg Oral Daily   rosuvastatin  20 mg Oral Daily   senna-docusate  2 tablet Oral BID   sodium chloride flush  3 mL Intravenous Q12H   thiamine  100 mg Oral Daily   Continuous Infusions:   PRN Meds:.acetaminophen **OR** acetaminophen, naLOXone (NARCAN)  injection, [DISCONTINUED] ondansetron **OR** ondansetron (ZOFRAN) IV, mouth rinse   I have personally reviewed following labs and imaging studies  LABORATORY DATA: CBC: Recent Labs  Lab 11/03/23 0507  WBC 6.1  NEUTROABS 3.6  HGB 12.3*  HCT 39.5  MCV 83.5  PLT 310    Basic Metabolic Panel: Recent Labs  Lab 11/03/23 0507  NA 141  K 3.9  CL 106  CO2 25  GLUCOSE 97  BUN 18  CREATININE 1.08  CALCIUM 9.3  MG 2.2  PHOS 3.4    GFR: Estimated Creatinine Clearance: 114.3 mL/min (by C-G formula based on SCr of 1.08 mg/dL).  Liver Function Tests: No results for input(s): "AST", "ALT", "ALKPHOS", "BILITOT", "PROT", "ALBUMIN" in the last 168 hours.  No results for input(s): "LIPASE", "AMYLASE" in the last 168 hours. No results for input(s): "AMMONIA" in the last 168 hours.   Coagulation Profile: No results for input(s): "INR", "PROTIME" in the last 168  hours.   Cardiac Enzymes: No results for input(s): "CKTOTAL", "CKMB", "CKMBINDEX", "TROPONINI" in the last 168 hours.   BNP (last 3 results) No results for input(s): "PROBNP" in the last 8760 hours.  Lipid Profile: No results for input(s): "CHOL", "HDL", "LDLCALC", "TRIG", "  CHOLHDL", "LDLDIRECT" in the last 72 hours.  Thyroid Function Tests: No results for input(s): "TSH", "T4TOTAL", "FREET4", "T3FREE", "THYROIDAB" in the last 72 hours.  Anemia Panel: No results for input(s): "VITAMINB12", "FOLATE", "FERRITIN", "TIBC", "IRON", "RETICCTPCT" in the last 72 hours.  Urine analysis:    Component Value Date/Time   COLORURINE YELLOW 10/25/2023 0850   APPEARANCEUR CLEAR 10/25/2023 0850   LABSPEC 1.016 10/25/2023 0850   PHURINE 6.0 10/25/2023 0850   GLUCOSEU NEGATIVE 10/25/2023 0850   HGBUR NEGATIVE 10/25/2023 0850   BILIRUBINUR NEGATIVE 10/25/2023 0850   KETONESUR NEGATIVE 10/25/2023 0850   PROTEINUR NEGATIVE 10/25/2023 0850   NITRITE NEGATIVE 10/25/2023 0850   LEUKOCYTESUR NEGATIVE 10/25/2023 0850    Sepsis Labs: Lactic Acid, Venous    Component Value Date/Time   LATICACIDVEN 0.6 10/25/2023 1138    MICROBIOLOGY: No results found for this or any previous visit (from the past 240 hours).   RADIOLOGY STUDIES/RESULTS: No results found.   LOS: 15 days   Jeoffrey Massed, MD  [facility]    To contact the attending provider between 7A-7P or the covering provider during after hours 7P-7A, please log into the web site [email or web address] and access using universal [place] password for that web site. If you do not have the password, please call the hospital operator.  11/09/2023, 12:30 PM

## 2023-11-10 LAB — BASIC METABOLIC PANEL WITH GFR
Anion gap: 10 (ref 5–15)
BUN: 16 mg/dL (ref 6–20)
CO2: 24 mmol/L (ref 22–32)
Calcium: 8.6 mg/dL — ABNORMAL LOW (ref 8.9–10.3)
Chloride: 105 mmol/L (ref 98–111)
Creatinine, Ser: 1.16 mg/dL (ref 0.61–1.24)
GFR, Estimated: >60 mL/min (ref >60)
Glucose, Bld: 148 mg/dL — ABNORMAL HIGH (ref 70–99)
Potassium: 3.8 mmol/L (ref 3.5–5.1)
Sodium: 139 mmol/L (ref 135–145)

## 2023-11-10 LAB — CBC
HCT: 37.3 % — ABNORMAL LOW (ref 39.0–52.0)
Hemoglobin: 11.6 g/dL — ABNORMAL LOW (ref 13.0–17.0)
MCH: 26.6 pg (ref 26.0–34.0)
MCHC: 31.1 g/dL (ref 30.0–36.0)
MCV: 85.6 fL (ref 80.0–100.0)
Platelets: 363 K/uL (ref 150–400)
RBC: 4.36 MIL/uL (ref 4.22–5.81)
RDW: 15.6 % — ABNORMAL HIGH (ref 11.5–15.5)
WBC: 5.5 K/uL (ref 4.0–10.5)
nRBC: 0 % (ref 0.0–0.2)

## 2023-11-10 LAB — GLUCOSE, CAPILLARY
Glucose-Capillary: 103 mg/dL — ABNORMAL HIGH (ref 70–99)
Glucose-Capillary: 105 mg/dL — ABNORMAL HIGH (ref 70–99)
Glucose-Capillary: 131 mg/dL — ABNORMAL HIGH (ref 70–99)

## 2023-11-13 LAB — GLUCOSE, CAPILLARY
Glucose-Capillary: 185 mg/dL — ABNORMAL HIGH (ref 70–99)
Glucose-Capillary: 86 mg/dL (ref 70–99)
Glucose-Capillary: 100 mg/dL — ABNORMAL HIGH (ref 70–99)

## 2023-11-13 MED ORDER — MOMETASONE FURO-FORMOTEROL FUM 100-5 MCG/ACT IN AERO
2.0000 | INHALATION_SPRAY | Freq: Two times a day (BID) | RESPIRATORY_TRACT | Status: DC
  Administered 2023-11-13 – 2024-06-05 (×381): 2 via RESPIRATORY_TRACT
  Filled 2023-11-13 (×13): qty 8.8

## 2023-11-14 LAB — GLUCOSE, CAPILLARY: Glucose-Capillary: 100 mg/dL — ABNORMAL HIGH (ref 70–99)

## 2023-11-14 MED ORDER — HALOPERIDOL LACTATE 5 MG/ML IJ SOLN: 2.5000 mg | Freq: Four times a day (QID) | INTRAMUSCULAR | Status: DC | PRN

## 2023-11-14 MED ORDER — HALOPERIDOL LACTATE 5 MG/ML IJ SOLN
INTRAMUSCULAR | Status: AC
  Filled 2023-11-14: qty 1

## 2023-11-14 MED ORDER — HALOPERIDOL LACTATE 5 MG/ML IJ SOLN
2.0000 mg | INTRAMUSCULAR | Status: AC
  Administered 2023-11-14: 2 mg via INTRAVENOUS
  Filled 2023-11-14: qty 1

## 2023-11-14 MED ORDER — MAGNESIUM SULFATE 2 GM/50ML IV SOLN: 2.0000 g | Freq: Once | INTRAVENOUS | Status: DC

## 2023-11-15 DIAGNOSIS — F05 Delirium due to known physiological condition: Secondary | ICD-10-CM

## 2023-11-15 MED ORDER — OLANZAPINE 10 MG IM SOLR
10.0000 mg | Freq: Three times a day (TID) | INTRAMUSCULAR | Status: DC | PRN
  Filled 2023-11-15: qty 10

## 2023-11-15 MED ORDER — OLANZAPINE 5 MG PO TABS
10.0000 mg | ORAL_TABLET | Freq: Three times a day (TID) | ORAL | Status: DC | PRN
  Administered 2023-12-21 – 2024-05-07 (×18): 10 mg via ORAL
  Filled 2023-11-15 (×21): qty 2

## 2023-11-16 LAB — COMPREHENSIVE METABOLIC PANEL WITH GFR
ALT: 48 U/L — ABNORMAL HIGH (ref 0–44)
AST: 24 U/L (ref 15–41)
Albumin: 3.6 g/dL (ref 3.5–5.0)
Alkaline Phosphatase: 76 U/L (ref 38–126)
Anion gap: 10 (ref 5–15)
BUN: 11 mg/dL (ref 6–20)
CO2: 23 mmol/L (ref 22–32)
Calcium: 9.1 mg/dL (ref 8.9–10.3)
Chloride: 103 mmol/L (ref 98–111)
Creatinine, Ser: 1.01 mg/dL (ref 0.61–1.24)
GFR, Estimated: >60 mL/min (ref >60)
Glucose, Bld: 113 mg/dL — ABNORMAL HIGH (ref 70–99)
Potassium: 4 mmol/L (ref 3.5–5.1)
Sodium: 136 mmol/L (ref 135–145)
Total Bilirubin: 0.4 mg/dL (ref <1.2)
Total Protein: 6.9 g/dL (ref 6.5–8.1)

## 2023-11-16 LAB — CBC
HCT: 39.7 % (ref 39.0–52.0)
Hemoglobin: 12.4 g/dL — ABNORMAL LOW (ref 13.0–17.0)
MCH: 26.1 pg (ref 26.0–34.0)
MCHC: 31.2 g/dL (ref 30.0–36.0)
MCV: 83.4 fL (ref 80.0–100.0)
Platelets: 352 K/uL (ref 150–400)
RBC: 4.76 MIL/uL (ref 4.22–5.81)
RDW: 15.1 % (ref 11.5–15.5)
WBC: 4.6 K/uL (ref 4.0–10.5)
nRBC: 0 % (ref 0.0–0.2)

## 2023-11-16 LAB — MAGNESIUM: Magnesium: 2.1 mg/dL (ref 1.7–2.4)

## 2023-11-19 MED ORDER — INFLUENZA VIRUS VACC SPLIT PF (FLUZONE) 0.5 ML IM SUSY
0.5000 mL | PREFILLED_SYRINGE | INTRAMUSCULAR | Status: AC
  Administered 2023-11-20: 0.5 mL via INTRAMUSCULAR
  Filled 2023-11-19: qty 0.5

## 2023-11-22 NOTE — Progress Notes (Signed)
 Mobility Specialist Progress Note;   11/22/23 1015  Mobility  Activity Ambulated with assistance in hallway  Level of Assistance Minimal assist, patient does 75% or more  Assistive Device None  Distance Ambulated (ft) 250 ft  Activity Response Tolerated well  Mobility Referral Yes  Mobility visit 1 Mobility  Mobility Specialist Start Time (ACUTE ONLY) 1015  Mobility Specialist Stop Time (ACUTE ONLY) 1030  Mobility Specialist Time Calculation (min) (ACUTE ONLY) 15 min   Pt eager for mboility. Required MinA during ambulation d/t lateral swaying while walking. Practiced some balancing movements while ambulating, pt was successful w/o any LOBs. C/o lower back pain. Still mildly confused. Pt returned back to chair with all needs met.   Lauraine Erm Mobility Specialist Please contact via SecureChat or [employer] [phone]

## 2023-11-22 NOTE — Progress Notes (Signed)
 PROGRESS NOTE        PATIENT DETAILS Name: Colton Lee Age: 57 y.o. Sex: male Date of Birth: 10/28/1966 Admit Date: 10/24/2023 Admitting Physician Isaiah LITTIE Lever, NP PCP:Pcp, No  Brief Summary:  Patient is a 57 y.o.  male with history of suspected peripheral neuropathy-who presented to the ED on 12/2 with bilateral leg pain/tooth pain-found to have hypothermia and altered mental status.  Admitted to [facility]-underwent extensive workup including neuroimaging/CSF sampling/LTM EEG which were all unrevealing.  After obtaining additional collaborative history from a casual contact/friend-who confirmed that at baseline patient has disorganized thinking-it was felt that patient likely had schizophrenia.  Seen by psychiatry-trial of Risperdal  started-with no improvement-and in fact had side effects like bradykinesia/rigidity/masked facies.  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  He unfortunately is homeless-has no capacity-TOC following-APS now involved for guardianship-currently medically stable for discharge-once SNF bed available.   Significant events: 12/3>> hypothermic-somnolent-mild hypercarbia-started on BiPAP-admit to [facility] 12/4>> liberated on BiPAP.  Neuro consulted-MRI/LP recommended. MRI neg for CVA. Given Ativan  for attempted lumbar puncture-subsequently became more somnolent-transferred to ICU-intubated. 12/5>> LP by IR (CSF WBC 0, protein 96) 12/6>> extubated 12/7>> transfer back to [facility] 12/10>> Dr. 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   Significant studies: 12/2>> CXR: Bibasilar atelectasis/infiltrate 12/2>> CT head: No acute intracranial abnormality-left-sided sinusitis 12/3>> NH4: Within normal limit 12/3>> HIV: Negative 12/4>> vitamin B1: 106.1 (normal) 12/4>> vitamin  B12: 504 12/4>> RPR: Pending 12/4>> MRI brain: No acute CVA/intracranial abnormality. 12 4-12/5>> LTM EEG: Negative 12/6>> CTA head/neck: No LVO/hemodynamically significant stenosis-but motion artifact study. 12/6>> CT venogram head: No sinus thrombosis 12/8>> TSH: Normal limit 12/8>> a.m. cortisol: 10.3  Significant microbiology data: 12/2>> COVID/influenza/RSV PCR: Negative 12/2>> blood culture: Negative 12/3>> blood culture: Negative 12/3>> respiratory virus panel: Negative 12/3>> urine culture: Negative 12/4>> tracheal aspirate: Normal flora 12/4>> CSF culture: Negative 12/4>> CSF meningitis/encephalitis panel: Negative  Procedures: 12/4-12/6>> ETT 12/5>> LP under fluoroscopy  Consults: PCCM Neurology Psych   Subjective:  No significant events overnight as discussed with staff, patient denies any complaints, but he is very poor historian   objective: Vitals: Blood pressure (!) 132/92, pulse 72, temperature 97.8 F (36.6 C), temperature source Oral, resp. rate 20, height 6' 5 (1.956 m), weight 132.1 kg, SpO2 97%.   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  Evaluated by psychiatry/neurology. Remains confused with disorganized thought process. 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 Labs were stable on 12/25. Overall patient with no behavioral issues, he is on as needed Zyprexa  otherwise avoid scheduled first-generation agents and Risperdal  due to EPS in the past  Hypothermia Due to environment/cold exposure TSH/Cortisol stable Sepsis/infection ruled out-all cultures negative  Acute hypercarbic/hypoxic respiratory failure  Aspiration pneumonia Secondary to somnolence/Ativan  use Intubated 12/4-12/6-currently stable on room air Completed a course of Augmentin  on 12/11 Respiratory status is stable. Continue inhaler.  AKI Hemodynamically mediated Resolved  Homelessness TOC following SNF planned  BMI: Estimated body mass index is 34.53 kg/m as calculated from the following:   Height as of this encounter: 6' 5 (1.956 m).   Weight as of this encounter: 132.1 kg.    Code status:   Code Status: Full Code   DVT Prophylaxis: SQ Lovenox    Family Communication: None at bedside  Disposition Plan: To be determined.  Guardianship being pursued.   Diet: Diet Order             Diet regular Room service appropriate? Yes with Assist; Fluid consistency: Thin  Diet effective now                     Antimicrobial agents: Anti-infectives (From admission, onward)    Start     Dose/Rate Route Frequency Ordered Stop   10/31/23 2200  amoxicillin -clavulanate (AUGMENTIN ) 875-125 MG per tablet 1 tablet        1 tablet Oral Every 12 hours 10/31/23 1355 11/01/23 2158   10/28/23 1200  cefTRIAXone  (ROCEPHIN ) 2 g in sodium chloride  0.9 % 100 mL IVPB  Status:  Discontinued        2 g 200 mL/hr over 30 Minutes Intravenous Every 24 hours 10/27/23 1402 10/28/23 1049   10/28/23 1145  Ampicillin -Sulbactam (UNASYN ) 3 g in sodium chloride  0.9 % 100 mL IVPB  Status:  Discontinued        3 g 200 mL/hr over 30 Minutes Intravenous Every 6 hours 10/28/23 1049 10/31/23 1355   10/27/23 0500   cefTRIAXone  (ROCEPHIN ) 2 g in sodium chloride  0.9 % 100 mL IVPB  Status:  Discontinued        2 g 200 mL/hr over 30 Minutes Intravenous Every 24 hours 10/26/23 1015 10/26/23 1103   10/27/23 0200  vancomycin  (VANCOCIN ) IVPB 1000 mg/200 mL premix  Status:  Discontinued        1,000 mg 200 mL/hr over 60 Minutes Intravenous Every 12 hours 10/26/23 1136 10/27/23 1402   10/26/23 1500  vancomycin  (VANCOCIN ) IVPB 1000 mg/200 mL premix       Placed in Followed by Linked Group   1,000 mg 200 mL/hr over 60 Minutes Intravenous  Once 10/26/23 1252 10/26/23 1651   10/26/23 1400  vancomycin  (VANCOCIN ) IVPB 1000 mg/200 mL premix       Placed in Followed by Linked Group   1,000 mg 200 mL/hr over 60 Minutes Intravenous  Once 10/26/23 1252 10/26/23 1923   10/26/23 1230  acyclovir  (ZOVIRAX ) 990 mg in dextrose  5 % 250 mL IVPB  Status:  Discontinued        10 mg/kg  98.8 kg (Adjusted) 269.8 mL/hr over 60 Minutes Intravenous Every 8 hours 10/26/23 1136 10/27/23 1402   10/26/23 1230  vancomycin  (VANCOREADY) IVPB 2000 mg/400 mL  Status:  Discontinued        2,000 mg 200 mL/hr over 120 Minutes Intravenous  Once 10/26/23 1136 10/26/23 1252  10/26/23 1200  cefTRIAXone  (ROCEPHIN ) 2 g in sodium chloride  0.9 % 100 mL IVPB  Status:  Discontinued        2 g 200 mL/hr over 30 Minutes Intravenous Every 12 hours 10/26/23 1103 10/27/23 1402   10/26/23 1200  ampicillin  (OMNIPEN) 2 g in sodium chloride  0.9 % 100 mL IVPB  Status:  Discontinued        2 g 300 mL/hr over 20 Minutes Intravenous Every 4 hours 10/26/23 1105 10/27/23 1402   10/26/23 1115  cefTRIAXone  (ROCEPHIN ) 1 g in sodium chloride  0.9 % 100 mL IVPB  Status:  Discontinued        1 g 200 mL/hr over 30 Minutes Intravenous  Once 10/26/23 1016 10/26/23 1103   10/26/23 0500  azithromycin  (ZITHROMAX ) 500 mg in sodium chloride  0.9 % 250 mL IVPB  Status:  Discontinued        500 mg 250 mL/hr over 60 Minutes Intravenous Every 24 hours 10/25/23 0840 10/26/23 1102    10/26/23 0500  cefTRIAXone  (ROCEPHIN ) 1 g in sodium chloride  0.9 % 100 mL IVPB  Status:  Discontinued        1 g 200 mL/hr over 30 Minutes Intravenous Every 24 hours 10/25/23 0840 10/26/23 1015   10/25/23 0400  cefTRIAXone  (ROCEPHIN ) 1 g in sodium chloride  0.9 % 100 mL IVPB        1 g 200 mL/hr over 30 Minutes Intravenous  Once 10/25/23 0356 10/25/23 0459   10/25/23 0400  azithromycin  (ZITHROMAX ) 500 mg in sodium chloride  0.9 % 250 mL IVPB        500 mg 250 mL/hr over 60 Minutes Intravenous  Once 10/25/23 0356 10/25/23 0708        MEDICATIONS: Scheduled Meds:  cetaphil   Topical Daily   enoxaparin  (LOVENOX ) injection  70 mg Subcutaneous Q24H   feeding supplement  237 mL Oral BID BM   folic acid   1 mg Oral Daily   mometasone -formoterol   2 puff Inhalation BID   multivitamin with minerals  1 tablet Oral Daily   pantoprazole   40 mg Oral Daily   rosuvastatin   20 mg Oral Daily   senna-docusate  2 tablet Oral BID   sodium chloride  flush  3 mL Intravenous Q12H   thiamine   100 mg Oral Daily   Continuous Infusions:   PRN Meds:.acetaminophen  **OR** acetaminophen , naLOXone  (NARCAN )  injection, OLANZapine  **OR** OLANZapine , [DISCONTINUED] ondansetron  **OR** ondansetron  (ZOFRAN ) IV   I have personally reviewed following labs and imaging studies  LABORATORY DATA: CBC: Recent Labs  Lab 11/16/23 1051  WBC 4.6  HGB 12.4*  HCT 39.7  MCV 83.4  PLT 352    Basic Metabolic Panel: Recent Labs  Lab 11/16/23 1051  NA 136  K 4.0  CL 103  CO2 23  GLUCOSE 113*  BUN 11  CREATININE 1.01  CALCIUM  9.1  MG 2.1    GFR: Estimated Creatinine Clearance: 121.3 mL/min (by C-G formula based on SCr of 1.01 mg/dL).   Urine analysis:    Component Value Date/Time   COLORURINE YELLOW 10/25/2023 0850   APPEARANCEUR CLEAR 10/25/2023 0850   LABSPEC 1.016 10/25/2023 0850   PHURINE 6.0 10/25/2023 0850   GLUCOSEU NEGATIVE 10/25/2023 0850   HGBUR NEGATIVE 10/25/2023 0850   BILIRUBINUR NEGATIVE  10/25/2023 0850   KETONESUR NEGATIVE 10/25/2023 0850   PROTEINUR NEGATIVE 10/25/2023 0850   NITRITE NEGATIVE 10/25/2023 0850   LEUKOCYTESUR NEGATIVE 10/25/2023 0850    Sepsis Labs: Lactic Acid, Venous    Component Value  Date/Time   LATICACIDVEN 0.6 10/25/2023 1138    MICROBIOLOGY: No results found for this or any previous visit (from the past 240 hours).   RADIOLOGY STUDIES/RESULTS: No results found.   LOS: 28 days    Brayton Lye, MD  [facility]    To contact the attending provider between 7A-7P or the covering provider during after hours 7P-7A, please log into the web site [email or web address] and access using universal [place] password for that web site. If you do not have the password, please call the hospital operator.  11/22/2023, 12:38 PM

## 2023-11-22 NOTE — Plan of Care (Signed)
  Problem: Education: Goal: Knowledge of General Education information will improve Description: Including pain rating scale, medication(s)/side effects and non-pharmacologic comfort measures Outcome: Progressing   Problem: Health Behavior/Discharge Planning: Goal: Ability to manage health-related needs will improve Outcome: Progressing   Problem: Activity: Goal: Risk for activity intolerance will decrease Outcome: Progressing   Problem: Nutrition: Goal: Adequate nutrition will be maintained Outcome: Progressing   Problem: Coping: Goal: Level of anxiety will decrease Outcome: Progressing   Problem: Elimination: Goal: Will not experience complications related to bowel motility Outcome: Progressing Goal: Will not experience complications related to urinary retention Outcome: Progressing   Problem: Pain Management: Goal: General experience of comfort will improve Outcome: Progressing   Problem: Safety: Goal: Ability to remain free from injury will improve Outcome: Progressing

## 2023-11-22 NOTE — Plan of Care (Signed)

## 2023-11-22 NOTE — Consult Note (Signed)
 [name] Consult Evaluation  Service Date: November 22, 2023 LOS:  LOS: 28 days    Primary Psychiatric Diagnoses  Korsakoff syndrome  Assessment  Colton Lee is a 57 y.o. male admitted medically for 10/24/2023  8:36 PM for altered mental status. He carries no psychiatric diagnoses and has an undetermined past medical. Psychiatry was consulted for ruling out psychiatric causes by Dr. Dennise.  His current presentation of altered mental status and confabulations was most consistent with differential of alcohol withdrawal, postictal state, autoimmune cephalitis, major neurocognitive disorder, Warnicke Korsakoff syndrome.  baseline mentation unknown but based on ED presentations last week appears to have able to communicate meaningfully prior to admission and has significantly declined since 12/2.  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  Neurology team has ruled out acute infectious and metabolic causes of encephalopathy.  There was a suspicion that alcohol withdraw was producing delirium but patient had minimal specific withdrawal sx and no response to benzos and regimen was discontinued with withdrawal ruled out.  Ruled out autoimmune encephalitis with send out panel.  Due to the confabulations, patient appears to have Korsakoff but was given a trial of high-dose thiamine  x3 days with no response.  Although presentation was not consistent with schizophrenia, we trialed an antipsychotic as a last hope for treating a reversible cause - unfortunately by 12/14 was found to have no real improvement in overall mental status after fairly adequate trial but with some side effects such as bradykinesia, cogwheeling, and masked facies. Risperdal  downtitrated/discontinued with continued lack of change in mentation and  improvement of EPS.  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  As schizophrenia (or reversible cause of AMS) fairly unlikely, we recommend next steps to involve APS for legal guardianship process, as patient clearly lacks competency and requires support with ADLs and IADLs likely for the rest of his life and has no next-of-kin to support decision making.   Psychiatry re-consulted for agitation by Dr. Verdene on 12/23 for agitation.  Patient apparently had became irritated the prior night and wanted to leave the hospital but was able to be redirected and did not require PRN.  Given cognitive impairment with Korsakoff syndrome, patient is unable to understand why he is here despite discussion why he is here numerous times (memory impairment). This agitation appears to only be an isolated episode, so we deferred starting any scheduled medications at this time but will added on PRN zyprexa . We would like to avoid 1st generation agents and risperdal  due to EPS in the past.   Next steps with agitation in order would include:  1. Verbal de-escalation (active listening, calm tone, validate, problem-solve, reassurance, set boundaries/expectations) and environmental modification (loud noises, bright lights) and assess bowel/bladder, hunger/thirst, pain, re-direct patient. 2. Pharmacological interventions with prn Zyprexa  PO (or IM if necessary) 3. Physical restraints as a last resort.   12/31: pt has been without behavioral incidents for 1 week. Overall mental status the same to very marginally improved from the last time I personally saw the pt in mid-December. No residual bradykinesia/masked facies. Psychiatry signing off.   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   ## Medical Decision Making Capacity:  Patient does not have capacity for medical decision making at this time due to severe memory impairment. Pt has no next of kin.  Given that this is very likely irreversible secondary to Korsakoff syndrome and in context of ruling out reversible medical, neurologic, and psychiatric causes we recommend involving APS for legal guardianship process due to concern for lack of competency.   ## Further Work-up:  --none  -- most recent EKG on 10/24/23 had QtC of 481 -- Pertinent labwork/imaging reviewed earlier this admission includes:  LP negative, CT head negative, MRI brain negative, CTA negative, EEG routine mild diffuse encephalopathy, UDS negative, ethanol level less than 10, unremarkable BMP, ammonia 33, B12 504, HIV negative, RPR negative, B1 106, autoimmune panel negative  ## Disposition:  -- There are no psychiatric contraindications to discharge at this time. Recommend involving APS for legal guardianship process due to concern for lack of competency. Would be unsafe discharge to self with current (likely permanent) mentation and ruled out all reversible causes.   ## Behavioral / Environmental:  --  Delirium Precautions: Delirium Interventions for Nursing and Staff: - RN to open blinds every AM. - To Bedside: Glasses, hearing aide, and pt's own shoes. Make available to patients. when possible and encourage use. - Encourage po fluids when appropriate, keep fluids within reach. - OOB to chair with meals. - Passive ROM exercises to all extremities with AM & PM care. - RN to assess orientation to person,  time and place QAM and PRN. - Recommend extended visitation hours with familiar family/friends as feasible. - Staff to minimize disturbances at night. Turn off television when pt asleep or when not in use.    ## Safety and Observation Level:  - Based on my clinical evaluation, I estimate the patient to be at low risk of self harm in the current setting - At this time, we recommend a low level of observation. This decision is based on my review of the chart including patient's history and current presentation, interview of the patient, mental status examination, and consideration of suicide risk including evaluating suicidal ideation, plan, intent, suicidal or self-harm behaviors, risk factors, and protective factors. This judgment is based on our ability to directly address suicide risk, implement suicide prevention strategies and develop a safety plan while the patient is in the clinical setting. Please contact our team if there is a concern that risk level has changed.  Suicide risk assessment  Patient has following modifiable risk factors for suicide: None  Patient has following non-modifiable or demographic risk factors for suicide: male gender  Patient has the following protective factors against suicide: None Identified due to altered mental status   Thank you for this consult request. Recommendations have been communicated to the primary team.  We follow peripherally by chart review at this time.    Colton Cornish, MD PGY-1 Psychiatry Resident 11/22/2023, 11:47 AM   Psychiatric and Social History   Relevant Aspects of Hospital Course:  Admitted on 10/24/2023 for altered mental status.  Full medical workup including infectious was negative.  Neurologic workup negative.  Psych consulted.   Patient Report:  Pt seen in late AM. He is oriented to self (gives name [facility]). He is able to identify 2/3 objects. He knows it is the last day of the year, but thinks it  is 1987 going into 1988 and he is about to play in a football championship. At another point states he thinks he is in an airport in [place]. Thinks he was being treated for aches and pains. Asked hima bout the movie he was watching (Gene [employer]acken) and he stated it was about the FDA and produce. Overall about the same as the last time I saw him, possibly marginally improved as he is now oriented x1.   Psych ROS:  Depression: Unable to assess Anxiety:  Unable to assess Mania (lifetime and current): Unable to assess Psychosis: (lifetime and current): Unable to assess  Collateral information:  Continued lack of collateral contacts.  Per Dr. Michaela who spoke with someone who knew the patient (since 2009), patient has long history of delusions including that his parents own [place]  and disorganized speech. Probable heavy drinking history.  Reports that in the past patient has been able to ask security for cigarette.  Psychiatric History:  Information collected from chart, patient unable to answer questions  Prev Dx/Sx: None Current Psych Provider: None Home Meds (current): None Previous Med Trials: None Therapy: None  Prior ECT: None Prior Psych Hospitalization: None Prior Self Harm: None Prior Violence: None  Family Psych History: None Family Hx suicide: None  Social History: Patient unable to provide answers due to altered mental status Living Situation: Experiencing homelessness Access to weapons: Unable to assess  Substance History Tobacco use: Unable to assess Alcohol use: Suggestive alcohol use history per discussion with other providers Drug use: unable to assess, negative uds   Exam Findings   Psychiatric Specialty Exam:    Mental Status Exam: General Appearance: Casual  Orientation:  x1  Memory:  Immediate;   Poor Recent;   Poor Remote;   Poor  Concentration:  Poor on narrative interview. Did not attempt testing.   Recall:  Poor   Attention  Poor  Eye Contact:  Good  Speech:  Clear and Coherent  Language:  Good  Volume:  Normal  Mood: did not state  Affect:  inappropriately euphoric, no signs of agitation during interview  Thought Process:  Irrelevant  Thought Content:  Illogical and Tangential,  subjectively answering questions a little more directly.   Suicidal Thoughts:  No  Homicidal Thoughts:  No  Judgement:  Impaired  Insight:  Lacking  Psychomotor Activity: Normal  Akathisia:  No  Fund of Knowledge:  Poor      Assets:  relative physical health   Cognition:  Poor  ADL's:  Clearly impaired  AIMS (if indicated):   No tardive dyskinesia noted on brief exam.      Physical Exam: Vital signs:  Temp:  [97.8 F (36.6 C)-98.5 F (36.9 C)] 97.8 F (36.6 C) (12/31 0449) Pulse Rate:  [65-72] 72 (12/31 0449) Resp:  [18-20] 20 (12/31 0449) BP: (132-143)/(73-93) 132/92 (12/31 0449) SpO2:  [96 %-99 %] 97 % (12/31 0449) Weight:  [132.1 kg] 132.1 kg (12/31 0449)  Physical Exam Vitals and nursing note reviewed.  HENT:     Head: Normocephalic and atraumatic.  Pulmonary:     Effort: Pulmonary effort is normal.  Neurological:     General: No focal deficit present.     Mental  Status: He is alert.     Blood pressure (!) 132/92, pulse 72, temperature 97.8 F (36.6 C), temperature source Oral, resp. rate 20, height 6' 5 (1.956 m), weight 132.1 kg, SpO2 97%. Body mass index is 34.53 kg/m.   Other History   These have been pulled in through the EMR, reviewed, and updated if appropriate.   Family History:  The patient's family history is not on file.  Medical History: History reviewed. No pertinent past medical history.  Surgical History: History reviewed. No pertinent surgical history.  Medications:   Current Facility-Administered Medications:  .  acetaminophen  (TYLENOL ) tablet 650 mg, 650 mg, Oral, Q6H PRN, 650 mg at 11/08/23 0407 **OR** acetaminophen  (TYLENOL ) suppository 650 mg, 650 mg,  Rectal, Q6H PRN, Alto Isaiah CROME, NP .  cetaphil lotion, , Topical, Daily, Cornelius Dines, MD, Given at 11/22/23 [phone] .  enoxaparin  (LOVENOX ) injection 70 mg, 70 mg, Subcutaneous, Q24H, Alva, Rakesh V, MD, 70 mg at 11/21/23 2205 .  feeding supplement (ENSURE ENLIVE / ENSURE PLUS) liquid 237 mL, 237 mL, Oral, BID BM, Alva, Rakesh V, MD, 237 mL at 11/22/23 0903 .  folic acid  (FOLVITE ) tablet 1 mg, 1 mg, Oral, Daily, Singh, Prashant K, MD, 1 mg at 11/22/23 0902 .  mometasone -formoterol  (DULERA ) 100-5 MCG/ACT inhaler 2 puff, 2 puff, Inhalation, BID, Krishnan, Gokul, MD, 2 puff at 11/22/23 [phone] .  multivitamin with minerals tablet 1 tablet, 1 tablet, Oral, Daily, Singh, Prashant K, MD, 1 tablet at 11/22/23 0902 .  naloxone  (NARCAN ) injection 0.4 mg, 0.4 mg, Intravenous, PRN, Lue Elsie BROCKS, MD, 0.4 mg at 10/25/23 1615 .  OLANZapine  (ZYPREXA ) tablet 10 mg, 10 mg, Oral, Q8H PRN **OR** OLANZapine  (ZYPREXA ) injection 10 mg, 10 mg, Intramuscular, Q8H PRN, McCarty, Artie, MD .  [DISCONTINUED] ondansetron  (ZOFRAN ) tablet 4 mg, 4 mg, Oral, Q6H PRN **OR** ondansetron  (ZOFRAN ) injection 4 mg, 4 mg, Intravenous, Q6H PRN, Alto Isaiah CROME, NP .  pantoprazole  (PROTONIX ) EC tablet 40 mg, 40 mg, Oral, Daily, Singh, Prashant K, MD, 40 mg at 11/22/23 0902 .  rosuvastatin  (CRESTOR ) tablet 20 mg, 20 mg, Oral, Daily, Fernand Prost, MD, 20 mg at 11/22/23 0902 .  senna-docusate (Senokot-S) tablet 2 tablet, 2 tablet, Oral, BID, Fernand Prost, MD, 2 tablet at 11/22/23 0902 .  sodium chloride  flush (NS) 0.9 % injection 3 mL, 3 mL, Intravenous, Q12H, Alto Isaiah L, NP, 3 mL at 11/20/23 1112 .  thiamine  (VITAMIN B1) tablet 100 mg, 100 mg, Oral, Daily, Elgergawy, Dawood S, MD, 100 mg at 11/22/23 9097  Allergies: No Known Allergies

## 2023-11-23 LAB — BASIC METABOLIC PANEL WITH GFR
Anion gap: 10 (ref 5–15)
BUN: 14 mg/dL (ref 6–20)
CO2: 25 mmol/L (ref 22–32)
Calcium: 9.1 mg/dL (ref 8.9–10.3)
Chloride: 104 mmol/L (ref 98–111)
Creatinine, Ser: 1.04 mg/dL (ref 0.61–1.24)
GFR, Estimated: >60 mL/min (ref >60)
Glucose, Bld: 134 mg/dL — ABNORMAL HIGH (ref 70–99)
Potassium: 3.5 mmol/L (ref 3.5–5.1)
Sodium: 139 mmol/L (ref 135–145)

## 2023-11-23 LAB — CBC
HCT: 38.8 % — ABNORMAL LOW (ref 39.0–52.0)
Hemoglobin: 12.1 g/dL — ABNORMAL LOW (ref 13.0–17.0)
MCH: 26.2 pg (ref 26.0–34.0)
MCHC: 31.2 g/dL (ref 30.0–36.0)
MCV: 84.2 fL (ref 80.0–100.0)
Platelets: 253 K/uL (ref 150–400)
RBC: 4.61 MIL/uL (ref 4.22–5.81)
RDW: 15.3 % (ref 11.5–15.5)
WBC: 5.3 K/uL (ref 4.0–10.5)
nRBC: 0 % (ref 0.0–0.2)

## 2023-11-23 LAB — MAGNESIUM: Magnesium: 2.1 mg/dL (ref 1.7–2.4)

## 2023-11-23 NOTE — Progress Notes (Signed)
 Mobility Specialist Progress Note;   11/23/23 1010  Mobility  Activity Ambulated with assistance in hallway  Level of Assistance Minimal assist, patient does 75% or more  Assistive Device None  Distance Ambulated (ft) 300 ft  Activity Response Tolerated well  Mobility Referral Yes  Mobility visit 1 Mobility  Mobility Specialist Start Time (ACUTE ONLY) 1010  Mobility Specialist Stop Time (ACUTE ONLY) 1022  Mobility Specialist Time Calculation (min) (ACUTE ONLY) 12 min   Pt agreeable to mobility. Required MinA during ambulation for safety. Practiced some balancing tasks during ambulation, pt performed successfully. VSS throughout and no c/o. Pt returned back to chair with all needs met. Alarm on.  Lauraine Erm Mobility Specialist Please contact via SecureChat or [employer] [phone]

## 2023-11-23 NOTE — Progress Notes (Signed)
 PROGRESS NOTE        PATIENT DETAILS Name: Colton Lee Age: 58 y.o. Sex: male Date of Birth: 10/28/1966 Admit Date: 10/24/2023 Admitting Physician Isaiah LITTIE Lever, NP PCP:Pcp, No  Brief Summary:  Patient is a 58 y.o.  male with history of suspected peripheral neuropathy-who presented to the ED on 12/2 with bilateral leg pain/tooth pain-found to have hypothermia and altered mental status.  Admitted to [facility]-underwent extensive workup including neuroimaging/CSF sampling/LTM EEG which were all unrevealing.  After obtaining additional collaborative history from a casual contact/friend-who confirmed that at baseline patient has disorganized thinking-it was felt that patient likely had schizophrenia.  Seen by psychiatry-trial of Risperdal  started-with no improvement-and in fact had side effects like bradykinesia/rigidity/masked facies.  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  He unfortunately is homeless-has no capacity-TOC following-APS now involved for guardianship-currently medically stable for discharge-once SNF bed available.   Significant events: 12/3>> hypothermic-somnolent-mild hypercarbia-started on BiPAP-admit to [facility] 12/4>> liberated on BiPAP.  Neuro consulted-MRI/LP recommended. MRI neg for CVA. Given Ativan  for attempted lumbar puncture-subsequently became more somnolent-transferred to ICU-intubated. 12/5>> LP by IR (CSF WBC 0, protein 96) 12/6>> extubated 12/7>> transfer back to [facility] 12/10>> Dr. 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   Significant studies: 12/2>> CXR: Bibasilar atelectasis/infiltrate 12/2>> CT head: No acute intracranial abnormality-left-sided sinusitis 12/3>> NH4: Within normal limit 12/3>> HIV: Negative 12/4>> vitamin B1: 106.1 (normal) 12/4>> vitamin  B12: 504 12/4>> RPR: Pending 12/4>> MRI brain: No acute CVA/intracranial abnormality. 12 4-12/5>> LTM EEG: Negative 12/6>> CTA head/neck: No LVO/hemodynamically significant stenosis-but motion artifact study. 12/6>> CT venogram head: No sinus thrombosis 12/8>> TSH: Normal limit 12/8>> a.m. cortisol: 10.3  Significant microbiology data: 12/2>> COVID/influenza/RSV PCR: Negative 12/2>> blood culture: Negative 12/3>> blood culture: Negative 12/3>> respiratory virus panel: Negative 12/3>> urine culture: Negative 12/4>> tracheal aspirate: Normal flora 12/4>> CSF culture: Negative 12/4>> CSF meningitis/encephalitis panel: Negative  Procedures: 12/4-12/6>> ETT 12/5>> LP under fluoroscopy  Consults: PCCM Neurology Psych   Subjective: Pleasantly confused  objective: Vitals: Blood pressure (!) 131/91, pulse 72, temperature 98.2 F (36.8 C), temperature source Oral, resp. rate 19, height 6' 5 (1.956 m), weight 132.1 kg, SpO2 97%.   Exam: Sitting up in bed No distress Moving all 4 extremities   Pertinent Labs/Radiology:    Latest Ref Rng & Units 11/23/2023    5:04 AM 11/16/2023   10:51 AM 11/10/2023    4:35 AM  CBC  WBC 4.0 - 10.5 K/uL 5.3  4.6  5.5   Hemoglobin 13.0 - 17.0 g/dL 87.8  87.5  88.3   Hematocrit 39.0 - 52.0 % 38.8  39.7  37.3   Platelets 150 - 400 K/uL 253  352  363     Lab Results  Component Value Date   NA 139 11/23/2023   K 3.5 11/23/2023   CL 104 11/23/2023   CO2 25 11/23/2023      Assessment/Plan: Korsakoff psychosis related dementia Extensive workup negative-see above.  Evaluated by psychiatry/neurology. Remains confused with disorganized thought process. At this time-thought to have Korsakoff psychosis at baseline (disorganized thought process-neurology able to get in touch with casual contact-[name]-[phone] on 12/10)  TOC following for guardianship/SNF-patient cannot sign out AMA-he has no capacity Both neurology/psychiatry had  signed off. Labs  were stable on 12/25. Overall patient with no behavioral issues, he is on as needed Zyprexa  otherwise avoid scheduled first-generation agents and Risperdal  due to EPS in the past  Hypothermia Due to environment/cold exposure TSH/Cortisol stable Sepsis/infection ruled out-all cultures negative  Acute hypercarbic/hypoxic respiratory failure  Aspiration pneumonia Secondary to somnolence/Ativan  use Intubated 12/4-12/6-currently stable on room air Completed a course of Augmentin  on 12/11 Respiratory status is stable. Continue inhaler.  AKI Hemodynamically mediated Resolved  Homelessness TOC following SNF planned  BMI: Estimated body mass index is 34.53 kg/m as calculated from the following:   Height as of this encounter: 6' 5 (1.956 m).   Weight as of this encounter: 132.1 kg.    Code status:   Code Status: Full Code   DVT Prophylaxis: SQ Lovenox    Family Communication: None at bedside  Disposition Plan: To be determined.  Guardianship being pursued.   Diet: Diet Order             Diet regular Room service appropriate? Yes with Assist; Fluid consistency: Thin  Diet effective now                     Antimicrobial agents: Anti-infectives (From admission, onward)    Start     Dose/Rate Route Frequency Ordered Stop   10/31/23 2200  amoxicillin -clavulanate (AUGMENTIN ) 875-125 MG per tablet 1 tablet        1 tablet Oral Every 12 hours 10/31/23 1355 11/01/23 2158   10/28/23 1200  cefTRIAXone  (ROCEPHIN ) 2 g in sodium chloride  0.9 % 100 mL IVPB  Status:  Discontinued        2 g 200 mL/hr over 30 Minutes Intravenous Every 24 hours 10/27/23 1402 10/28/23 1049   10/28/23 1145  Ampicillin -Sulbactam (UNASYN ) 3 g in sodium chloride  0.9 % 100 mL IVPB  Status:  Discontinued        3 g 200 mL/hr over 30 Minutes Intravenous Every 6 hours 10/28/23 1049 10/31/23 1355   10/27/23 0500  cefTRIAXone  (ROCEPHIN ) 2 g in sodium chloride  0.9 % 100 mL IVPB   Status:  Discontinued        2 g 200 mL/hr over 30 Minutes Intravenous Every 24 hours 10/26/23 1015 10/26/23 1103   10/27/23 0200  vancomycin  (VANCOCIN ) IVPB 1000 mg/200 mL premix  Status:  Discontinued        1,000 mg 200 mL/hr over 60 Minutes Intravenous Every 12 hours 10/26/23 1136 10/27/23 1402   10/26/23 1500  vancomycin  (VANCOCIN ) IVPB 1000 mg/200 mL premix       Placed in Followed by Linked Group   1,000 mg 200 mL/hr over 60 Minutes Intravenous  Once 10/26/23 1252 10/26/23 1651   10/26/23 1400  vancomycin  (VANCOCIN ) IVPB 1000 mg/200 mL premix       Placed in Followed by Linked Group   1,000 mg 200 mL/hr over 60 Minutes Intravenous  Once 10/26/23 1252 10/26/23 1923   10/26/23 1230  acyclovir  (ZOVIRAX ) 990 mg in dextrose  5 % 250 mL IVPB  Status:  Discontinued        10 mg/kg  98.8 kg (Adjusted) 269.8 mL/hr over 60 Minutes Intravenous Every 8 hours 10/26/23 1136 10/27/23 1402   10/26/23 1230  vancomycin  (VANCOREADY) IVPB 2000 mg/400 mL  Status:  Discontinued        2,000 mg 200 mL/hr over 120 Minutes Intravenous  Once 10/26/23 1136 10/26/23 1252   10/26/23 1200  cefTRIAXone  (ROCEPHIN ) 2 g in sodium chloride  0.9 % 100 mL IVPB  Status:  Discontinued        2 g 200 mL/hr over 30 Minutes Intravenous Every 12 hours 10/26/23 1103 10/27/23 1402   10/26/23 1200  ampicillin  (OMNIPEN) 2 g in sodium chloride  0.9 % 100 mL IVPB  Status:  Discontinued        2 g 300 mL/hr over 20 Minutes Intravenous Every 4 hours 10/26/23 1105 10/27/23 1402   10/26/23 1115  cefTRIAXone  (ROCEPHIN ) 1 g in sodium chloride  0.9 % 100 mL IVPB  Status:  Discontinued        1 g 200 mL/hr over 30 Minutes Intravenous  Once 10/26/23 1016 10/26/23 1103   10/26/23 0500  azithromycin  (ZITHROMAX ) 500 mg in sodium chloride  0.9 % 250 mL IVPB  Status:  Discontinued        500 mg 250 mL/hr over 60 Minutes Intravenous Every 24 hours 10/25/23 0840 10/26/23 1102   10/26/23 0500  cefTRIAXone  (ROCEPHIN ) 1 g in sodium chloride  0.9  % 100 mL IVPB  Status:  Discontinued        1 g 200 mL/hr over 30 Minutes Intravenous Every 24 hours 10/25/23 0840 10/26/23 1015   10/25/23 0400  cefTRIAXone  (ROCEPHIN ) 1 g in sodium chloride  0.9 % 100 mL IVPB        1 g 200 mL/hr over 30 Minutes Intravenous  Once 10/25/23 0356 10/25/23 0459   10/25/23 0400  azithromycin  (ZITHROMAX ) 500 mg in sodium chloride  0.9 % 250 mL IVPB        500 mg 250 mL/hr over 60 Minutes Intravenous  Once 10/25/23 0356 10/25/23 0708        MEDICATIONS: Scheduled Meds:  cetaphil   Topical Daily   enoxaparin  (LOVENOX ) injection  70 mg Subcutaneous Q24H   feeding supplement  237 mL Oral BID BM   folic acid   1 mg Oral Daily   mometasone -formoterol   2 puff Inhalation BID   multivitamin with minerals  1 tablet Oral Daily   pantoprazole   40 mg Oral Daily   rosuvastatin   20 mg Oral Daily   senna-docusate  2 tablet Oral BID   sodium chloride  flush  3 mL Intravenous Q12H   thiamine   100 mg Oral Daily   Continuous Infusions:   PRN Meds:.acetaminophen  **OR** acetaminophen , naLOXone  (NARCAN )  injection, OLANZapine  **OR** OLANZapine , [DISCONTINUED] ondansetron  **OR** ondansetron  (ZOFRAN ) IV   I have personally reviewed following labs and imaging studies  LABORATORY DATA: CBC: Recent Labs  Lab 11/16/23 1051 11/23/23 0504  WBC 4.6 5.3  HGB 12.4* 12.1*  HCT 39.7 38.8*  MCV 83.4 84.2  PLT 352 253    Basic Metabolic Panel: Recent Labs  Lab 11/16/23 1051 11/23/23 0504  NA 136 139  K 4.0 3.5  CL 103 104  CO2 23 25  GLUCOSE 113* 134*  BUN 11 14  CREATININE 1.01 1.04  CALCIUM  9.1 9.1  MG 2.1 2.1    GFR: Estimated Creatinine Clearance: 117.8 mL/min (by C-G formula based on SCr of 1.04 mg/dL).   Urine analysis:    Component Value Date/Time   COLORURINE YELLOW 10/25/2023 0850   APPEARANCEUR CLEAR 10/25/2023 0850   LABSPEC 1.016 10/25/2023 0850   PHURINE 6.0 10/25/2023 0850   GLUCOSEU NEGATIVE 10/25/2023 0850   HGBUR NEGATIVE 10/25/2023  0850   BILIRUBINUR NEGATIVE 10/25/2023 0850   KETONESUR NEGATIVE 10/25/2023 0850   PROTEINUR NEGATIVE 10/25/2023 0850   NITRITE NEGATIVE 10/25/2023 0850   LEUKOCYTESUR NEGATIVE 10/25/2023 0850    Sepsis Labs: Lactic Acid, Venous  Component Value Date/Time   LATICACIDVEN 0.6 10/25/2023 1138    MICROBIOLOGY: No results found for this or any previous visit (from the past 240 hours).   RADIOLOGY STUDIES/RESULTS: No results found.   LOS: 29 days    Donalda Applebaum, MD  [facility]    To contact the attending provider between 7A-7P or the covering provider during after hours 7P-7A, please log into the web site [email or web address] and access using universal [place] password for that web site. If you do not have the password, please call the hospital operator.  11/23/2023, 10:15 AM

## 2023-11-23 NOTE — Plan of Care (Signed)

## 2023-11-24 NOTE — Progress Notes (Signed)
 Mobility Specialist Progress Note;   11/24/23 1025  Mobility  Activity Ambulated with assistance in hallway  Level of Assistance Contact guard assist, steadying assist  Assistive Device None  Distance Ambulated (ft) 300 ft  Activity Response Tolerated well  Mobility Referral Yes  Mobility visit 1 Mobility  Mobility Specialist Start Time (ACUTE ONLY) 1025  Mobility Specialist Stop Time (ACUTE ONLY) 1038  Mobility Specialist Time Calculation (min) (ACUTE ONLY) 13 min   Pt eager for mobility. Required MinG assistance throughout ambulation for safety. Practiced some balancing tasks while ambulating, pt performed successfully. C/o lower back pain once returned to room. Pt returned back to chair with all needs met.   Lauraine Erm Mobility Specialist Please contact via SecureChat or [employer] [phone]

## 2023-11-24 NOTE — Plan of Care (Signed)

## 2023-11-24 NOTE — TOC Progression Note (Signed)
 Transition of Care [facility]) - Progression Note    Patient Details  Name: Silvano Bulls MRN: [id number] Date of Birth: 10/28/1966  [facility] CM/SW Contact  Inocente GORMAN Kindle, LCSW Phone Number: 11/24/2023, 8:50 AM  Clinical Narrative:    CSW continuing to follow and await next steps from [facility].    Expected Discharge Plan: Skilled Nursing Facility Barriers to Discharge: Homeless with medical needs, Inadequate or no insurance  Expected Discharge Plan and Services In-house Referral: Clinical Social Work     Living arrangements for the past 2 months: Homeless                                       Social Determinants of Health (SDOH) Interventions SDOH Screenings   Food Insecurity: Patient Unable To Answer (10/25/2023)  Housing: Patient Unable To Answer (10/25/2023)  Transportation Needs: Patient Unable To Answer (10/25/2023)  Utilities: Patient Unable To Answer (10/25/2023)  Tobacco Use: High Risk (10/27/2023)    Readmission Risk Interventions     No data to display

## 2023-11-24 NOTE — Progress Notes (Signed)
 PROGRESS NOTE        PATIENT DETAILS Name: Colton Lee Age: 58 y.o. Sex: male Date of Birth: 10/28/1966 Admit Date: 10/24/2023 Admitting Physician Isaiah LITTIE Lever, NP PCP:Pcp, No  Brief Summary:  Patient is a 58 y.o.  male with history of suspected peripheral neuropathy-who presented to the ED on 12/2 with bilateral leg pain/tooth pain-found to have hypothermia and altered mental status.  Admitted to [facility]-underwent extensive workup including neuroimaging/CSF sampling/LTM EEG which were all unrevealing.  After obtaining additional collaborative history from a casual contact/friend-who confirmed that at baseline patient has disorganized thinking-it was felt that patient likely had schizophrenia.  Seen by psychiatry-trial of Risperdal  started-with no improvement-and in fact had side effects like bradykinesia/rigidity/masked facies.  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  He unfortunately is homeless-has no capacity-TOC following-APS now involved for guardianship-currently medically stable for discharge-once SNF bed available.   Significant events: 12/3>> hypothermic-somnolent-mild hypercarbia-started on BiPAP-admit to [facility] 12/4>> liberated on BiPAP.  Neuro consulted-MRI/LP recommended. MRI neg for CVA. Given Ativan  for attempted lumbar puncture-subsequently became more somnolent-transferred to ICU-intubated. 12/5>> LP by IR (CSF WBC 0, protein 96) 12/6>> extubated 12/7>> transfer back to [facility] 12/10>> Dr. 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   Significant studies: 12/2>> CXR: Bibasilar atelectasis/infiltrate 12/2>> CT head: No acute intracranial abnormality-left-sided sinusitis 12/3>> NH4: Within normal limit 12/3>> HIV: Negative 12/4>> vitamin B1: 106.1 (normal) 12/4>> vitamin  B12: 504 12/4>> RPR: Pending 12/4>> MRI brain: No acute CVA/intracranial abnormality. 12 4-12/5>> LTM EEG: Negative 12/6>> CTA head/neck: No LVO/hemodynamically significant stenosis-but motion artifact study. 12/6>> CT venogram head: No sinus thrombosis 12/8>> TSH: Normal limit 12/8>> a.m. cortisol: 10.3  Significant microbiology data: 12/2>> COVID/influenza/RSV PCR: Negative 12/2>> blood culture: Negative 12/3>> blood culture: Negative 12/3>> respiratory virus panel: Negative 12/3>> urine culture: Negative 12/4>> tracheal aspirate: Normal flora 12/4>> CSF culture: Negative 12/4>> CSF meningitis/encephalitis panel: Negative  Procedures: 12/4-12/6>> ETT 12/5>> LP under fluoroscopy  Consults: PCCM Neurology Psych   Subjective: No complaint-pleasantly confused.  objective: Vitals: Blood pressure (!) 113/94, pulse 79, temperature 98.8 F (37.1 C), temperature source Oral, resp. rate 20, height 6' 5 (1.956 m), weight 132.1 kg, SpO2 94%.   Exam: Walking in the room when I saw him earlier this morning   Pertinent Labs/Radiology:    Latest Ref Rng & Units 11/23/2023    5:04 AM 11/16/2023   10:51 AM 11/10/2023    4:35 AM  CBC  WBC 4.0 - 10.5 K/uL 5.3  4.6  5.5   Hemoglobin 13.0 - 17.0 g/dL 87.8  87.5  88.3   Hematocrit 39.0 - 52.0 % 38.8  39.7  37.3   Platelets 150 - 400 K/uL 253  352  363     Lab Results  Component Value Date   NA 139 11/23/2023   K 3.5 11/23/2023   CL 104 11/23/2023   CO2 25 11/23/2023      Assessment/Plan: Korsakoff psychosis related dementia Extensive workup negative-see above.  Evaluated by psychiatry/neurology. Remains confused with disorganized thought process. At this time-thought to have Korsakoff psychosis at baseline (disorganized thought process-neurology able to get in touch with casual contact-[name]-[phone] on 12/10)  TOC following for guardianship/SNF-patient cannot sign out AMA-he has no capacity Both  neurology/psychiatry had signed  off. Labs were stable on 12/25. Overall patient with no behavioral issues, he is on as needed Zyprexa  otherwise avoid scheduled first-generation agents and Risperdal  due to EPS in the past  Hypothermia Due to environment/cold exposure TSH/Cortisol stable Sepsis/infection ruled out-all cultures negative  Acute hypercarbic/hypoxic respiratory failure  Aspiration pneumonia Secondary to somnolence/Ativan  use Intubated 12/4-12/6-currently stable on room air Completed a course of Augmentin  on 12/11 Respiratory status is stable. Continue inhaler.  AKI Hemodynamically mediated Resolved  Homelessness TOC following SNF planned  BMI: Estimated body mass index is 34.53 kg/m as calculated from the following:   Height as of this encounter: 6' 5 (1.956 m).   Weight as of this encounter: 132.1 kg.    Code status:   Code Status: Full Code   DVT Prophylaxis: SQ Lovenox    Family Communication: None at bedside  Disposition Plan: To be determined.  Guardianship being pursued.   Diet: Diet Order             Diet regular Room service appropriate? Yes with Assist; Fluid consistency: Thin  Diet effective now                     Antimicrobial agents: Anti-infectives (From admission, onward)    Start     Dose/Rate Route Frequency Ordered Stop   10/31/23 2200  amoxicillin -clavulanate (AUGMENTIN ) 875-125 MG per tablet 1 tablet        1 tablet Oral Every 12 hours 10/31/23 1355 11/01/23 2158   10/28/23 1200  cefTRIAXone  (ROCEPHIN ) 2 g in sodium chloride  0.9 % 100 mL IVPB  Status:  Discontinued        2 g 200 mL/hr over 30 Minutes Intravenous Every 24 hours 10/27/23 1402 10/28/23 1049   10/28/23 1145  Ampicillin -Sulbactam (UNASYN ) 3 g in sodium chloride  0.9 % 100 mL IVPB  Status:  Discontinued        3 g 200 mL/hr over 30 Minutes Intravenous Every 6 hours 10/28/23 1049 10/31/23 1355   10/27/23 0500  cefTRIAXone  (ROCEPHIN ) 2 g in sodium chloride   0.9 % 100 mL IVPB  Status:  Discontinued        2 g 200 mL/hr over 30 Minutes Intravenous Every 24 hours 10/26/23 1015 10/26/23 1103   10/27/23 0200  vancomycin  (VANCOCIN ) IVPB 1000 mg/200 mL premix  Status:  Discontinued        1,000 mg 200 mL/hr over 60 Minutes Intravenous Every 12 hours 10/26/23 1136 10/27/23 1402   10/26/23 1500  vancomycin  (VANCOCIN ) IVPB 1000 mg/200 mL premix       Placed in Followed by Linked Group   1,000 mg 200 mL/hr over 60 Minutes Intravenous  Once 10/26/23 1252 10/26/23 1651   10/26/23 1400  vancomycin  (VANCOCIN ) IVPB 1000 mg/200 mL premix       Placed in Followed by Linked Group   1,000 mg 200 mL/hr over 60 Minutes Intravenous  Once 10/26/23 1252 10/26/23 1923   10/26/23 1230  acyclovir  (ZOVIRAX ) 990 mg in dextrose  5 % 250 mL IVPB  Status:  Discontinued        10 mg/kg  98.8 kg (Adjusted) 269.8 mL/hr over 60 Minutes Intravenous Every 8 hours 10/26/23 1136 10/27/23 1402   10/26/23 1230  vancomycin  (VANCOREADY) IVPB 2000 mg/400 mL  Status:  Discontinued        2,000 mg 200 mL/hr over 120 Minutes Intravenous  Once 10/26/23 1136 10/26/23 1252   10/26/23 1200  cefTRIAXone  (ROCEPHIN ) 2 g in sodium chloride  0.9 % 100  mL IVPB  Status:  Discontinued        2 g 200 mL/hr over 30 Minutes Intravenous Every 12 hours 10/26/23 1103 10/27/23 1402   10/26/23 1200  ampicillin  (OMNIPEN) 2 g in sodium chloride  0.9 % 100 mL IVPB  Status:  Discontinued        2 g 300 mL/hr over 20 Minutes Intravenous Every 4 hours 10/26/23 1105 10/27/23 1402   10/26/23 1115  cefTRIAXone  (ROCEPHIN ) 1 g in sodium chloride  0.9 % 100 mL IVPB  Status:  Discontinued        1 g 200 mL/hr over 30 Minutes Intravenous  Once 10/26/23 1016 10/26/23 1103   10/26/23 0500  azithromycin  (ZITHROMAX ) 500 mg in sodium chloride  0.9 % 250 mL IVPB  Status:  Discontinued        500 mg 250 mL/hr over 60 Minutes Intravenous Every 24 hours 10/25/23 0840 10/26/23 1102   10/26/23 0500  cefTRIAXone  (ROCEPHIN ) 1 g in  sodium chloride  0.9 % 100 mL IVPB  Status:  Discontinued        1 g 200 mL/hr over 30 Minutes Intravenous Every 24 hours 10/25/23 0840 10/26/23 1015   10/25/23 0400  cefTRIAXone  (ROCEPHIN ) 1 g in sodium chloride  0.9 % 100 mL IVPB        1 g 200 mL/hr over 30 Minutes Intravenous  Once 10/25/23 0356 10/25/23 0459   10/25/23 0400  azithromycin  (ZITHROMAX ) 500 mg in sodium chloride  0.9 % 250 mL IVPB        500 mg 250 mL/hr over 60 Minutes Intravenous  Once 10/25/23 0356 10/25/23 0708        MEDICATIONS: Scheduled Meds:  cetaphil   Topical Daily   enoxaparin  (LOVENOX ) injection  70 mg Subcutaneous Q24H   feeding supplement  237 mL Oral BID BM   folic acid   1 mg Oral Daily   mometasone -formoterol   2 puff Inhalation BID   multivitamin with minerals  1 tablet Oral Daily   pantoprazole   40 mg Oral Daily   rosuvastatin   20 mg Oral Daily   senna-docusate  2 tablet Oral BID   sodium chloride  flush  3 mL Intravenous Q12H   thiamine   100 mg Oral Daily   Continuous Infusions:   PRN Meds:.acetaminophen  **OR** acetaminophen , naLOXone  (NARCAN )  injection, OLANZapine  **OR** OLANZapine , [DISCONTINUED] ondansetron  **OR** ondansetron  (ZOFRAN ) IV   I have personally reviewed following labs and imaging studies  LABORATORY DATA: CBC: Recent Labs  Lab 11/23/23 0504  WBC 5.3  HGB 12.1*  HCT 38.8*  MCV 84.2  PLT 253    Basic Metabolic Panel: Recent Labs  Lab 11/23/23 0504  NA 139  K 3.5  CL 104  CO2 25  GLUCOSE 134*  BUN 14  CREATININE 1.04  CALCIUM  9.1  MG 2.1    GFR: Estimated Creatinine Clearance: 117.8 mL/min (by C-G formula based on SCr of 1.04 mg/dL).   Urine analysis:    Component Value Date/Time   COLORURINE YELLOW 10/25/2023 0850   APPEARANCEUR CLEAR 10/25/2023 0850   LABSPEC 1.016 10/25/2023 0850   PHURINE 6.0 10/25/2023 0850   GLUCOSEU NEGATIVE 10/25/2023 0850   HGBUR NEGATIVE 10/25/2023 0850   BILIRUBINUR NEGATIVE 10/25/2023 0850   KETONESUR NEGATIVE  10/25/2023 0850   PROTEINUR NEGATIVE 10/25/2023 0850   NITRITE NEGATIVE 10/25/2023 0850   LEUKOCYTESUR NEGATIVE 10/25/2023 0850    Sepsis Labs: Lactic Acid, Venous    Component Value Date/Time   LATICACIDVEN 0.6 10/25/2023 1138    MICROBIOLOGY: No results  found for this or any previous visit (from the past 240 hours).   RADIOLOGY STUDIES/RESULTS: No results found.   LOS: 30 days    Donalda Applebaum, MD  [facility]    To contact the attending provider between 7A-7P or the covering provider during after hours 7P-7A, please log into the web site [email or web address] and access using universal [place] password for that web site. If you do not have the password, please call the hospital operator.  11/24/2023, 2:21 PM

## 2023-11-25 NOTE — Progress Notes (Signed)
 PROGRESS NOTE        PATIENT DETAILS Name: Colton Lee Age: 58 y.o. Sex: male Date of Birth: 10/28/1966 Admit Date: 10/24/2023 Admitting Physician Isaiah LITTIE Lever, NP PCP:Pcp, No  Brief Summary:  Patient is a 58 y.o.  male with history of suspected peripheral neuropathy-who presented to the ED on 12/2 with bilateral leg pain/tooth pain-found to have hypothermia and altered mental status.  Admitted to [facility]-underwent extensive workup including neuroimaging/CSF sampling/LTM EEG which were all unrevealing.  After obtaining additional collaborative history from a casual contact/friend-who confirmed that at baseline patient has disorganized thinking-it was felt that patient likely had schizophrenia.  Seen by psychiatry-trial of Risperdal  started-with no improvement-and in fact had side effects like bradykinesia/rigidity/masked facies.  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  He unfortunately is homeless-has no capacity-TOC following-APS now involved for guardianship-currently medically stable for discharge-once SNF bed available.   Significant events: 12/3>> hypothermic-somnolent-mild hypercarbia-started on BiPAP-admit to [facility] 12/4>> liberated on BiPAP.  Neuro consulted-MRI/LP recommended. MRI neg for CVA. Given Ativan  for attempted lumbar puncture-subsequently became more somnolent-transferred to ICU-intubated. 12/5>> LP by IR (CSF WBC 0, protein 96) 12/6>> extubated 12/7>> transfer back to [facility] 12/10>> Dr. 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   Significant studies: 12/2>> CXR: Bibasilar atelectasis/infiltrate 12/2>> CT head: No acute intracranial abnormality-left-sided sinusitis 12/3>> NH4: Within normal limit 12/3>> HIV: Negative 12/4>> vitamin B1: 106.1 (normal) 12/4>> vitamin  B12: 504 12/4>> RPR: Pending 12/4>> MRI brain: No acute CVA/intracranial abnormality. 12 4-12/5>> LTM EEG: Negative 12/6>> CTA head/neck: No LVO/hemodynamically significant stenosis-but motion artifact study. 12/6>> CT venogram head: No sinus thrombosis 12/8>> TSH: Normal limit 12/8>> a.m. cortisol: 10.3  Significant microbiology data: 12/2>> COVID/influenza/RSV PCR: Negative 12/2>> blood culture: Negative 12/3>> blood culture: Negative 12/3>> respiratory virus panel: Negative 12/3>> urine culture: Negative 12/4>> tracheal aspirate: Normal flora 12/4>> CSF culture: Negative 12/4>> CSF meningitis/encephalitis panel: Negative  Procedures: 12/4-12/6>> ETT 12/5>> LP under fluoroscopy  Consults: PCCM Neurology Psych   Subjective: No complaints-pleasantly confused.  objective: Vitals: Blood pressure (!) 132/111, pulse 80, temperature 97.6 F (36.4 C), temperature source Oral, resp. rate 18, height 6' 5 (1.956 m), weight 132.1 kg, SpO2 98%.   Exam: Awake-not in any distress.   Pertinent Labs/Radiology:    Latest Ref Rng & Units 11/23/2023    5:04 AM 11/16/2023   10:51 AM 11/10/2023    4:35 AM  CBC  WBC 4.0 - 10.5 K/uL 5.3  4.6  5.5   Hemoglobin 13.0 - 17.0 g/dL 87.8  87.5  88.3   Hematocrit 39.0 - 52.0 % 38.8  39.7  37.3   Platelets 150 - 400 K/uL 253  352  363     Lab Results  Component Value Date   NA 139 11/23/2023   K 3.5 11/23/2023   CL 104 11/23/2023   CO2 25 11/23/2023      Assessment/Plan: Korsakoff psychosis related dementia Extensive workup negative-see above.  Evaluated by psychiatry/neurology. Remains confused with disorganized thought process. At this time-thought to have Korsakoff psychosis at baseline (disorganized thought process-neurology able to get in touch with casual contact-[name] on 12/10)  TOC following for guardianship/SNF-patient cannot sign out AMA-he has no capacity Both neurology/psychiatry had signed  off. Labs were stable on 12/25. Overall  patient with no behavioral issues, he is on as needed Zyprexa  otherwise avoid scheduled first-generation agents and Risperdal  due to EPS in the past  Hypothermia Due to environment/cold exposure TSH/Cortisol stable Sepsis/infection ruled out-all cultures negative  Acute hypercarbic/hypoxic respiratory failure  Aspiration pneumonia Secondary to somnolence/Ativan  use Intubated 12/4-12/6-currently stable on room air Completed a course of Augmentin  on 12/11 Respiratory status is stable. Continue inhaler.  AKI Hemodynamically mediated Resolved  Homelessness TOC following SNF planned  BMI: Estimated body mass index is 34.53 kg/m as calculated from the following:   Height as of this encounter: 6' 5 (1.956 m).   Weight as of this encounter: 132.1 kg.    Code status:   Code Status: Full Code   DVT Prophylaxis: SQ Lovenox    Family Communication: None at bedside  Disposition Plan: To be determined.  Guardianship being pursued.   Diet: Diet Order             Diet regular Room service appropriate? Yes with Assist; Fluid consistency: Thin  Diet effective now                     Antimicrobial agents: Anti-infectives (From admission, onward)    Start     Dose/Rate Route Frequency Ordered Stop   10/31/23 2200  amoxicillin -clavulanate (AUGMENTIN ) 875-125 MG per tablet 1 tablet        1 tablet Oral Every 12 hours 10/31/23 1355 11/01/23 2158   10/28/23 1200  cefTRIAXone  (ROCEPHIN ) 2 g in sodium chloride  0.9 % 100 mL IVPB  Status:  Discontinued        2 g 200 mL/hr over 30 Minutes Intravenous Every 24 hours 10/27/23 1402 10/28/23 1049   10/28/23 1145  Ampicillin -Sulbactam (UNASYN ) 3 g in sodium chloride  0.9 % 100 mL IVPB  Status:  Discontinued        3 g 200 mL/hr over 30 Minutes Intravenous Every 6 hours 10/28/23 1049 10/31/23 1355   10/27/23 0500  cefTRIAXone  (ROCEPHIN ) 2 g in sodium chloride  0.9 % 100 mL IVPB  Status:   Discontinued        2 g 200 mL/hr over 30 Minutes Intravenous Every 24 hours 10/26/23 1015 10/26/23 1103   10/27/23 0200  vancomycin  (VANCOCIN ) IVPB 1000 mg/200 mL premix  Status:  Discontinued        1,000 mg 200 mL/hr over 60 Minutes Intravenous Every 12 hours 10/26/23 1136 10/27/23 1402   10/26/23 1500  vancomycin  (VANCOCIN ) IVPB 1000 mg/200 mL premix       Placed in Followed by Linked Group   1,000 mg 200 mL/hr over 60 Minutes Intravenous  Once 10/26/23 1252 10/26/23 1651   10/26/23 1400  vancomycin  (VANCOCIN ) IVPB 1000 mg/200 mL premix       Placed in Followed by Linked Group   1,000 mg 200 mL/hr over 60 Minutes Intravenous  Once 10/26/23 1252 10/26/23 1923   10/26/23 1230  acyclovir  (ZOVIRAX ) 990 mg in dextrose  5 % 250 mL IVPB  Status:  Discontinued        10 mg/kg  98.8 kg (Adjusted) 269.8 mL/hr over 60 Minutes Intravenous Every 8 hours 10/26/23 1136 10/27/23 1402   10/26/23 1230  vancomycin  (VANCOREADY) IVPB 2000 mg/400 mL  Status:  Discontinued        2,000 mg 200 mL/hr over 120 Minutes Intravenous  Once 10/26/23 1136 10/26/23 1252   10/26/23 1200  cefTRIAXone  (ROCEPHIN ) 2 g in sodium chloride  0.9 % 100 mL IVPB  Status:  Discontinued  2 g 200 mL/hr over 30 Minutes Intravenous Every 12 hours 10/26/23 1103 10/27/23 1402   10/26/23 1200  ampicillin  (OMNIPEN) 2 g in sodium chloride  0.9 % 100 mL IVPB  Status:  Discontinued        2 g 300 mL/hr over 20 Minutes Intravenous Every 4 hours 10/26/23 1105 10/27/23 1402   10/26/23 1115  cefTRIAXone  (ROCEPHIN ) 1 g in sodium chloride  0.9 % 100 mL IVPB  Status:  Discontinued        1 g 200 mL/hr over 30 Minutes Intravenous  Once 10/26/23 1016 10/26/23 1103   10/26/23 0500  azithromycin  (ZITHROMAX ) 500 mg in sodium chloride  0.9 % 250 mL IVPB  Status:  Discontinued        500 mg 250 mL/hr over 60 Minutes Intravenous Every 24 hours 10/25/23 0840 10/26/23 1102   10/26/23 0500  cefTRIAXone  (ROCEPHIN ) 1 g in sodium chloride  0.9 % 100 mL  IVPB  Status:  Discontinued        1 g 200 mL/hr over 30 Minutes Intravenous Every 24 hours 10/25/23 0840 10/26/23 1015   10/25/23 0400  cefTRIAXone  (ROCEPHIN ) 1 g in sodium chloride  0.9 % 100 mL IVPB        1 g 200 mL/hr over 30 Minutes Intravenous  Once 10/25/23 0356 10/25/23 0459   10/25/23 0400  azithromycin  (ZITHROMAX ) 500 mg in sodium chloride  0.9 % 250 mL IVPB        500 mg 250 mL/hr over 60 Minutes Intravenous  Once 10/25/23 0356 10/25/23 0708        MEDICATIONS: Scheduled Meds:  cetaphil   Topical Daily   enoxaparin  (LOVENOX ) injection  70 mg Subcutaneous Q24H   feeding supplement  237 mL Oral BID BM   folic acid   1 mg Oral Daily   mometasone -formoterol   2 puff Inhalation BID   multivitamin with minerals  1 tablet Oral Daily   pantoprazole   40 mg Oral Daily   rosuvastatin   20 mg Oral Daily   senna-docusate  2 tablet Oral BID   sodium chloride  flush  3 mL Intravenous Q12H   thiamine   100 mg Oral Daily   Continuous Infusions:   PRN Meds:.acetaminophen  **OR** acetaminophen , naLOXone  (NARCAN )  injection, OLANZapine  **OR** OLANZapine , [DISCONTINUED] ondansetron  **OR** ondansetron  (ZOFRAN ) IV   I have personally reviewed following labs and imaging studies  LABORATORY DATA: CBC: Recent Labs  Lab 11/23/23 0504  WBC 5.3  HGB 12.1*  HCT 38.8*  MCV 84.2  PLT 253    Basic Metabolic Panel: Recent Labs  Lab 11/23/23 0504  NA 139  K 3.5  CL 104  CO2 25  GLUCOSE 134*  BUN 14  CREATININE 1.04  CALCIUM  9.1  MG 2.1    GFR: Estimated Creatinine Clearance: 117.8 mL/min (by C-G formula based on SCr of 1.04 mg/dL).   Urine analysis:    Component Value Date/Time   COLORURINE YELLOW 10/25/2023 0850   APPEARANCEUR CLEAR 10/25/2023 0850   LABSPEC 1.016 10/25/2023 0850   PHURINE 6.0 10/25/2023 0850   GLUCOSEU NEGATIVE 10/25/2023 0850   HGBUR NEGATIVE 10/25/2023 0850   BILIRUBINUR NEGATIVE 10/25/2023 0850   KETONESUR NEGATIVE 10/25/2023 0850   PROTEINUR  NEGATIVE 10/25/2023 0850   NITRITE NEGATIVE 10/25/2023 0850   LEUKOCYTESUR NEGATIVE 10/25/2023 0850    Sepsis Labs: Lactic Acid, Venous    Component Value Date/Time   LATICACIDVEN 0.6 10/25/2023 1138    MICROBIOLOGY: No results found for this or any previous visit (from the past 240 hours).  RADIOLOGY STUDIES/RESULTS: No results found.   LOS: 31 days    Donalda Applebaum, MD  [facility]    To contact the attending provider between 7A-7P or the covering provider during after hours 7P-7A, please log into the web site [email or web address] and access using universal [place] password for that web site. If you do not have the password, please call the hospital operator.  11/25/2023, 11:59 AM

## 2023-11-25 NOTE — Progress Notes (Signed)
 Mobility Specialist Progress Note;   11/25/23 1045  Mobility  Activity Ambulated with assistance in hallway  Level of Assistance Contact guard assist, steadying assist  Assistive Device None  Distance Ambulated (ft) 400 ft  Activity Response Tolerated well  Mobility Referral Yes  Mobility visit 1 Mobility  Mobility Specialist Start Time (ACUTE ONLY) 1045  Mobility Specialist Stop Time (ACUTE ONLY) 1100  Mobility Specialist Time Calculation (min) (ACUTE ONLY) 15 min   Pt eager for mobility. Required MinG assistance during ambulation for safety. Asx throughout w/ no c/o during session. Pt returned back to chair with all needs met.   Lauraine Erm Mobility Specialist Please contact via SecureChat or [employer] [phone]

## 2023-11-25 NOTE — Plan of Care (Signed)

## 2023-11-26 NOTE — Plan of Care (Signed)

## 2023-11-26 NOTE — Progress Notes (Signed)
 PROGRESS NOTE        PATIENT DETAILS Name: Colton Lee Age: 58 y.o. Sex: male Date of Birth: 10/28/1966 Admit Date: 10/24/2023 Admitting Physician Isaiah LITTIE Lever, NP PCP:Pcp, No  Brief Summary:  Patient is a 58 y.o.  male with history of suspected peripheral neuropathy-who presented to the ED on 12/2 with bilateral leg pain/tooth pain-found to have hypothermia and altered mental status.  Admitted to [facility]-underwent extensive workup including neuroimaging/CSF sampling/LTM EEG which were all unrevealing.  After obtaining additional collaborative history from a casual contact/friend-who confirmed that at baseline patient has disorganized thinking-it was felt that patient likely had schizophrenia.  Seen by psychiatry-trial of Risperdal  started-with no improvement-and in fact had side effects like bradykinesia/rigidity/masked facies.  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  He unfortunately is homeless-has no capacity-TOC following-APS now involved for guardianship-currently medically stable for discharge-once SNF bed available.   Significant events: 12/3>> hypothermic-somnolent-mild hypercarbia-started on BiPAP-admit to [facility] 12/4>> liberated on BiPAP.  Neuro consulted-MRI/LP recommended. MRI neg for CVA. Given Ativan  for attempted lumbar puncture-subsequently became more somnolent-transferred to ICU-intubated. 12/5>> LP by IR (CSF WBC 0, protein 96) 12/6>> extubated 12/7>> transfer back to [facility] 12/10>> Dr. 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   Significant studies: 12/2>> CXR: Bibasilar atelectasis/infiltrate 12/2>> CT head: No acute intracranial abnormality-left-sided sinusitis 12/3>> NH4: Within normal limit 12/3>> HIV: Negative 12/4>> vitamin B1: 106.1 (normal) 12/4>> vitamin  B12: 504 12/4>> RPR: Pending 12/4>> MRI brain: No acute CVA/intracranial abnormality. 12 4-12/5>> LTM EEG: Negative 12/6>> CTA head/neck: No LVO/hemodynamically significant stenosis-but motion artifact study. 12/6>> CT venogram head: No sinus thrombosis 12/8>> TSH: Normal limit 12/8>> a.m. cortisol: 10.3  Significant microbiology data: 12/2>> COVID/influenza/RSV PCR: Negative 12/2>> blood culture: Negative 12/3>> blood culture: Negative 12/3>> respiratory virus panel: Negative 12/3>> urine culture: Negative 12/4>> tracheal aspirate: Normal flora 12/4>> CSF culture: Negative 12/4>> CSF meningitis/encephalitis panel: Negative  Procedures: 12/4-12/6>> ETT 12/5>> LP under fluoroscopy  Consults: PCCM Neurology Psych   Subjective: No complaints-tells me he was born in [place] .  Answers some questions appropriately this morning.  objective: Vitals: Blood pressure (!) 144/85, pulse 75, temperature 97.6 F (36.4 C), temperature source Oral, resp. rate 16, height 6' 5 (1.956 m), weight 132.1 kg, SpO2 96%.   Exam: Awake/alert Not in any distress   Pertinent Labs/Radiology:    Latest Ref Rng & Units 11/23/2023    5:04 AM 11/16/2023   10:51 AM 11/10/2023    4:35 AM  CBC  WBC 4.0 - 10.5 K/uL 5.3  4.6  5.5   Hemoglobin 13.0 - 17.0 g/dL 87.8  87.5  88.3   Hematocrit 39.0 - 52.0 % 38.8  39.7  37.3   Platelets 150 - 400 K/uL 253  352  363     Lab Results  Component Value Date   NA 139 11/23/2023   K 3.5 11/23/2023   CL 104 11/23/2023   CO2 25 11/23/2023      Assessment/Plan: Korsakoff psychosis related dementia Extensive workup negative-see above.  Evaluated by psychiatry/neurology. Remains confused with disorganized thought process. At this time-thought to have Korsakoff psychosis at baseline (disorganized thought process-neurology able to get in touch with casual contact-[name]-[phone] on 12/10)  TOC following for guardianship/SNF-patient cannot sign  out AMA-he has  no capacity Both neurology/psychiatry had signed off. Labs were stable on 12/25. Overall patient with no behavioral issues, he is on as needed Zyprexa  otherwise avoid scheduled first-generation agents and Risperdal  due to EPS in the past  Hypothermia Due to environment/cold exposure TSH/Cortisol stable Sepsis/infection ruled out-all cultures negative  Acute hypercarbic/hypoxic respiratory failure  Aspiration pneumonia Secondary to somnolence/Ativan  use Intubated 12/4-12/6-currently stable on room air Completed a course of Augmentin  on 12/11 Respiratory status is stable. Continue inhaler.  AKI Hemodynamically mediated Resolved  Homelessness TOC following SNF planned  BMI: Estimated body mass index is 34.53 kg/m as calculated from the following:   Height as of this encounter: 6' 5 (1.956 m).   Weight as of this encounter: 132.1 kg.    Code status:   Code Status: Full Code   DVT Prophylaxis: SQ Lovenox    Family Communication: None at bedside  Disposition Plan: To be determined.  Guardianship being pursued.   Diet: Diet Order             Diet regular Room service appropriate? Yes with Assist; Fluid consistency: Thin  Diet effective now                     Antimicrobial agents: Anti-infectives (From admission, onward)    Start     Dose/Rate Route Frequency Ordered Stop   10/31/23 2200  amoxicillin -clavulanate (AUGMENTIN ) 875-125 MG per tablet 1 tablet        1 tablet Oral Every 12 hours 10/31/23 1355 11/01/23 2158   10/28/23 1200  cefTRIAXone  (ROCEPHIN ) 2 g in sodium chloride  0.9 % 100 mL IVPB  Status:  Discontinued        2 g 200 mL/hr over 30 Minutes Intravenous Every 24 hours 10/27/23 1402 10/28/23 1049   10/28/23 1145  Ampicillin -Sulbactam (UNASYN ) 3 g in sodium chloride  0.9 % 100 mL IVPB  Status:  Discontinued        3 g 200 mL/hr over 30 Minutes Intravenous Every 6 hours 10/28/23 1049 10/31/23 1355   10/27/23 0500  cefTRIAXone   (ROCEPHIN ) 2 g in sodium chloride  0.9 % 100 mL IVPB  Status:  Discontinued        2 g 200 mL/hr over 30 Minutes Intravenous Every 24 hours 10/26/23 1015 10/26/23 1103   10/27/23 0200  vancomycin  (VANCOCIN ) IVPB 1000 mg/200 mL premix  Status:  Discontinued        1,000 mg 200 mL/hr over 60 Minutes Intravenous Every 12 hours 10/26/23 1136 10/27/23 1402   10/26/23 1500  vancomycin  (VANCOCIN ) IVPB 1000 mg/200 mL premix       Placed in Followed by Linked Group   1,000 mg 200 mL/hr over 60 Minutes Intravenous  Once 10/26/23 1252 10/26/23 1651   10/26/23 1400  vancomycin  (VANCOCIN ) IVPB 1000 mg/200 mL premix       Placed in Followed by Linked Group   1,000 mg 200 mL/hr over 60 Minutes Intravenous  Once 10/26/23 1252 10/26/23 1923   10/26/23 1230  acyclovir  (ZOVIRAX ) 990 mg in dextrose  5 % 250 mL IVPB  Status:  Discontinued        10 mg/kg  98.8 kg (Adjusted) 269.8 mL/hr over 60 Minutes Intravenous Every 8 hours 10/26/23 1136 10/27/23 1402   10/26/23 1230  vancomycin  (VANCOREADY) IVPB 2000 mg/400 mL  Status:  Discontinued        2,000 mg 200 mL/hr over 120 Minutes Intravenous  Once 10/26/23 1136 10/26/23 1252   10/26/23 1200  cefTRIAXone  (ROCEPHIN ) 2 g  in sodium chloride  0.9 % 100 mL IVPB  Status:  Discontinued        2 g 200 mL/hr over 30 Minutes Intravenous Every 12 hours 10/26/23 1103 10/27/23 1402   10/26/23 1200  ampicillin  (OMNIPEN) 2 g in sodium chloride  0.9 % 100 mL IVPB  Status:  Discontinued        2 g 300 mL/hr over 20 Minutes Intravenous Every 4 hours 10/26/23 1105 10/27/23 1402   10/26/23 1115  cefTRIAXone  (ROCEPHIN ) 1 g in sodium chloride  0.9 % 100 mL IVPB  Status:  Discontinued        1 g 200 mL/hr over 30 Minutes Intravenous  Once 10/26/23 1016 10/26/23 1103   10/26/23 0500  azithromycin  (ZITHROMAX ) 500 mg in sodium chloride  0.9 % 250 mL IVPB  Status:  Discontinued        500 mg 250 mL/hr over 60 Minutes Intravenous Every 24 hours 10/25/23 0840 10/26/23 1102   10/26/23  0500  cefTRIAXone  (ROCEPHIN ) 1 g in sodium chloride  0.9 % 100 mL IVPB  Status:  Discontinued        1 g 200 mL/hr over 30 Minutes Intravenous Every 24 hours 10/25/23 0840 10/26/23 1015   10/25/23 0400  cefTRIAXone  (ROCEPHIN ) 1 g in sodium chloride  0.9 % 100 mL IVPB        1 g 200 mL/hr over 30 Minutes Intravenous  Once 10/25/23 0356 10/25/23 0459   10/25/23 0400  azithromycin  (ZITHROMAX ) 500 mg in sodium chloride  0.9 % 250 mL IVPB        500 mg 250 mL/hr over 60 Minutes Intravenous  Once 10/25/23 0356 10/25/23 0708        MEDICATIONS: Scheduled Meds:  cetaphil   Topical Daily   enoxaparin  (LOVENOX ) injection  70 mg Subcutaneous Q24H   feeding supplement  237 mL Oral BID BM   folic acid   1 mg Oral Daily   mometasone -formoterol   2 puff Inhalation BID   multivitamin with minerals  1 tablet Oral Daily   pantoprazole   40 mg Oral Daily   rosuvastatin   20 mg Oral Daily   senna-docusate  2 tablet Oral BID   sodium chloride  flush  3 mL Intravenous Q12H   thiamine   100 mg Oral Daily   Continuous Infusions:   PRN Meds:.acetaminophen  **OR** acetaminophen , naLOXone  (NARCAN )  injection, OLANZapine  **OR** OLANZapine , [DISCONTINUED] ondansetron  **OR** ondansetron  (ZOFRAN ) IV   I have personally reviewed following labs and imaging studies  LABORATORY DATA: CBC: Recent Labs  Lab 11/23/23 0504  WBC 5.3  HGB 12.1*  HCT 38.8*  MCV 84.2  PLT 253    Basic Metabolic Panel: Recent Labs  Lab 11/23/23 0504  NA 139  K 3.5  CL 104  CO2 25  GLUCOSE 134*  BUN 14  CREATININE 1.04  CALCIUM  9.1  MG 2.1    GFR: Estimated Creatinine Clearance: 117.8 mL/min (by C-G formula based on SCr of 1.04 mg/dL).   Urine analysis:    Component Value Date/Time   COLORURINE YELLOW 10/25/2023 0850   APPEARANCEUR CLEAR 10/25/2023 0850   LABSPEC 1.016 10/25/2023 0850   PHURINE 6.0 10/25/2023 0850   GLUCOSEU NEGATIVE 10/25/2023 0850   HGBUR NEGATIVE 10/25/2023 0850   BILIRUBINUR NEGATIVE  10/25/2023 0850   KETONESUR NEGATIVE 10/25/2023 0850   PROTEINUR NEGATIVE 10/25/2023 0850   NITRITE NEGATIVE 10/25/2023 0850   LEUKOCYTESUR NEGATIVE 10/25/2023 0850    Sepsis Labs: Lactic Acid, Venous    Component Value Date/Time   LATICACIDVEN 0.6 10/25/2023 1138  MICROBIOLOGY: No results found for this or any previous visit (from the past 240 hours).   RADIOLOGY STUDIES/RESULTS: No results found.   LOS: 32 days    Donalda Applebaum, MD  [facility]    To contact the attending provider between 7A-7P or the covering provider during after hours 7P-7A, please log into the web site [email or web address] and access using universal [place] password for that web site. If you do not have the password, please call the hospital operator.  11/26/2023, 10:15 AM

## 2023-11-27 NOTE — Plan of Care (Signed)
  Problem: Education: Goal: Knowledge of General Education information will improve Description: Including pain rating scale, medication(s)/side effects and non-pharmacologic comfort measures Outcome: Progressing   Problem: Clinical Measurements: Goal: Diagnostic test results will improve Outcome: Progressing Goal: Respiratory complications will improve Outcome: Progressing   Problem: Coping: Goal: Level of anxiety will decrease Outcome: Progressing   Problem: Skin Integrity: Goal: Risk for impaired skin integrity will decrease Outcome: Progressing

## 2023-11-27 NOTE — Progress Notes (Signed)
 PROGRESS NOTE        PATIENT DETAILS Name: Colton Lee Age: 58 y.o. Sex: male Date of Birth: 10/28/1966 Admit Date: 10/24/2023 Admitting Physician Isaiah LITTIE Lever, NP PCP:Pcp, No  Brief Summary:  Patient is a 58 y.o.  male with history of suspected peripheral neuropathy-who presented to the ED on 12/2 with bilateral leg pain/tooth pain-found to have hypothermia and altered mental status.  Admitted to [facility]-underwent extensive workup including neuroimaging/CSF sampling/LTM EEG which were all unrevealing.  After obtaining additional collaborative history from a casual contact/friend-who confirmed that at baseline patient has disorganized thinking-it was felt that patient likely had schizophrenia.  Seen by psychiatry-trial of Risperdal  started-with no improvement-and in fact had side effects like bradykinesia/rigidity/masked facies.  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  He unfortunately is homeless-has no capacity-TOC following-APS now involved for guardianship-currently medically stable for discharge-once SNF bed available.   Significant events: 12/3>> hypothermic-somnolent-mild hypercarbia-started on BiPAP-admit to [facility] 12/4>> liberated on BiPAP.  Neuro consulted-MRI/LP recommended. MRI neg for CVA. Given Ativan  for attempted lumbar puncture-subsequently became more somnolent-transferred to ICU-intubated. 12/5>> LP by IR (CSF WBC 0, protein 96) 12/6>> extubated 12/7>> transfer back to [facility] 12/10>> Dr. 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   Significant studies: 12/2>> CXR: Bibasilar atelectasis/infiltrate 12/2>> CT head: No acute intracranial abnormality-left-sided sinusitis 12/3>> NH4: Within normal limit 12/3>> HIV: Negative 12/4>> vitamin B1: 106.1 (normal) 12/4>> vitamin  B12: 504 12/4>> RPR: Pending 12/4>> MRI brain: No acute CVA/intracranial abnormality. 12 4-12/5>> LTM EEG: Negative 12/6>> CTA head/neck: No LVO/hemodynamically significant stenosis-but motion artifact study. 12/6>> CT venogram head: No sinus thrombosis 12/8>> TSH: Normal limit 12/8>> a.m. cortisol: 10.3  Significant microbiology data: 12/2>> COVID/influenza/RSV PCR: Negative 12/2>> blood culture: Negative 12/3>> blood culture: Negative 12/3>> respiratory virus panel: Negative 12/3>> urine culture: Negative 12/4>> tracheal aspirate: Normal flora 12/4>> CSF culture: Negative 12/4>> CSF meningitis/encephalitis panel: Negative  Procedures: 12/4-12/6>> ETT 12/5>> LP under fluoroscopy  Consults: PCCM Neurology Psych   Subjective: Unchanged-no complaints.  objective: Vitals: Blood pressure 128/82, pulse 76, temperature 97.6 F (36.4 C), temperature source Oral, resp. rate 19, height 6' 5 (1.956 m), weight 132.1 kg, SpO2 97%.   Exam: Awake-pleasantly confused-answers some questions appropriately Not in any distress.   Pertinent Labs/Radiology:    Latest Ref Rng & Units 11/23/2023    5:04 AM 11/16/2023   10:51 AM 11/10/2023    4:35 AM  CBC  WBC 4.0 - 10.5 K/uL 5.3  4.6  5.5   Hemoglobin 13.0 - 17.0 g/dL 87.8  87.5  88.3   Hematocrit 39.0 - 52.0 % 38.8  39.7  37.3   Platelets 150 - 400 K/uL 253  352  363     Lab Results  Component Value Date   NA 139 11/23/2023   K 3.5 11/23/2023   CL 104 11/23/2023   CO2 25 11/23/2023      Assessment/Plan: Korsakoff psychosis related dementia Extensive workup negative-see above.  Evaluated by psychiatry/neurology. Remains confused with disorganized thought process. At this time-thought to have Korsakoff psychosis at baseline (disorganized thought process-neurology able to get in touch with casual contact-[name]-[phone] on 12/10)  TOC following for guardianship/SNF-patient cannot sign out AMA-he has no  capacity Both neurology/psychiatry had signed off. Labs were stable  on 12/25. Overall patient with no behavioral issues, he is on as needed Zyprexa  otherwise avoid scheduled first-generation agents and Risperdal  due to EPS in the past  Hypothermia Due to environment/cold exposure TSH/Cortisol stable Sepsis/infection ruled out-all cultures negative  Acute hypercarbic/hypoxic respiratory failure  Aspiration pneumonia Secondary to somnolence/Ativan  use Intubated 12/4-12/6-currently stable on room air Completed a course of Augmentin  on 12/11 Respiratory status is stable. Continue inhaler.  AKI Hemodynamically mediated Resolved  Homelessness TOC following SNF planned  BMI: Estimated body mass index is 34.53 kg/m as calculated from the following:   Height as of this encounter: 6' 5 (1.956 m).   Weight as of this encounter: 132.1 kg.    Code status:   Code Status: Full Code   DVT Prophylaxis: SQ Lovenox    Family Communication: None at bedside  Disposition Plan: To be determined.  Guardianship being pursued.   Diet: Diet Order             Diet regular Room service appropriate? Yes with Assist; Fluid consistency: Thin  Diet effective now                     Antimicrobial agents: Anti-infectives (From admission, onward)    Start     Dose/Rate Route Frequency Ordered Stop   10/31/23 2200  amoxicillin -clavulanate (AUGMENTIN ) 875-125 MG per tablet 1 tablet        1 tablet Oral Every 12 hours 10/31/23 1355 11/01/23 2158   10/28/23 1200  cefTRIAXone  (ROCEPHIN ) 2 g in sodium chloride  0.9 % 100 mL IVPB  Status:  Discontinued        2 g 200 mL/hr over 30 Minutes Intravenous Every 24 hours 10/27/23 1402 10/28/23 1049   10/28/23 1145  Ampicillin -Sulbactam (UNASYN ) 3 g in sodium chloride  0.9 % 100 mL IVPB  Status:  Discontinued        3 g 200 mL/hr over 30 Minutes Intravenous Every 6 hours 10/28/23 1049 10/31/23 1355   10/27/23 0500  cefTRIAXone  (ROCEPHIN ) 2 g in  sodium chloride  0.9 % 100 mL IVPB  Status:  Discontinued        2 g 200 mL/hr over 30 Minutes Intravenous Every 24 hours 10/26/23 1015 10/26/23 1103   10/27/23 0200  vancomycin  (VANCOCIN ) IVPB 1000 mg/200 mL premix  Status:  Discontinued        1,000 mg 200 mL/hr over 60 Minutes Intravenous Every 12 hours 10/26/23 1136 10/27/23 1402   10/26/23 1500  vancomycin  (VANCOCIN ) IVPB 1000 mg/200 mL premix       Placed in Followed by Linked Group   1,000 mg 200 mL/hr over 60 Minutes Intravenous  Once 10/26/23 1252 10/26/23 1651   10/26/23 1400  vancomycin  (VANCOCIN ) IVPB 1000 mg/200 mL premix       Placed in Followed by Linked Group   1,000 mg 200 mL/hr over 60 Minutes Intravenous  Once 10/26/23 1252 10/26/23 1923   10/26/23 1230  acyclovir  (ZOVIRAX ) 990 mg in dextrose  5 % 250 mL IVPB  Status:  Discontinued        10 mg/kg  98.8 kg (Adjusted) 269.8 mL/hr over 60 Minutes Intravenous Every 8 hours 10/26/23 1136 10/27/23 1402   10/26/23 1230  vancomycin  (VANCOREADY) IVPB 2000 mg/400 mL  Status:  Discontinued        2,000 mg 200 mL/hr over 120 Minutes Intravenous  Once 10/26/23 1136 10/26/23 1252   10/26/23 1200  cefTRIAXone  (ROCEPHIN ) 2 g in sodium chloride  0.9 % 100 mL IVPB  Status:  Discontinued        2 g 200 mL/hr over 30 Minutes Intravenous Every 12 hours 10/26/23 1103 10/27/23 1402   10/26/23 1200  ampicillin  (OMNIPEN) 2 g in sodium chloride  0.9 % 100 mL IVPB  Status:  Discontinued        2 g 300 mL/hr over 20 Minutes Intravenous Every 4 hours 10/26/23 1105 10/27/23 1402   10/26/23 1115  cefTRIAXone  (ROCEPHIN ) 1 g in sodium chloride  0.9 % 100 mL IVPB  Status:  Discontinued        1 g 200 mL/hr over 30 Minutes Intravenous  Once 10/26/23 1016 10/26/23 1103   10/26/23 0500  azithromycin  (ZITHROMAX ) 500 mg in sodium chloride  0.9 % 250 mL IVPB  Status:  Discontinued        500 mg 250 mL/hr over 60 Minutes Intravenous Every 24 hours 10/25/23 0840 10/26/23 1102   10/26/23 0500  cefTRIAXone   (ROCEPHIN ) 1 g in sodium chloride  0.9 % 100 mL IVPB  Status:  Discontinued        1 g 200 mL/hr over 30 Minutes Intravenous Every 24 hours 10/25/23 0840 10/26/23 1015   10/25/23 0400  cefTRIAXone  (ROCEPHIN ) 1 g in sodium chloride  0.9 % 100 mL IVPB        1 g 200 mL/hr over 30 Minutes Intravenous  Once 10/25/23 0356 10/25/23 0459   10/25/23 0400  azithromycin  (ZITHROMAX ) 500 mg in sodium chloride  0.9 % 250 mL IVPB        500 mg 250 mL/hr over 60 Minutes Intravenous  Once 10/25/23 0356 10/25/23 0708        MEDICATIONS: Scheduled Meds:  cetaphil   Topical Daily   enoxaparin  (LOVENOX ) injection  70 mg Subcutaneous Q24H   feeding supplement  237 mL Oral BID BM   folic acid   1 mg Oral Daily   mometasone -formoterol   2 puff Inhalation BID   multivitamin with minerals  1 tablet Oral Daily   pantoprazole   40 mg Oral Daily   rosuvastatin   20 mg Oral Daily   senna-docusate  2 tablet Oral BID   sodium chloride  flush  3 mL Intravenous Q12H   thiamine   100 mg Oral Daily   Continuous Infusions:   PRN Meds:.acetaminophen  **OR** acetaminophen , naLOXone  (NARCAN )  injection, OLANZapine  **OR** OLANZapine , [DISCONTINUED] ondansetron  **OR** ondansetron  (ZOFRAN ) IV   I have personally reviewed following labs and imaging studies  LABORATORY DATA: CBC: Recent Labs  Lab 11/23/23 0504  WBC 5.3  HGB 12.1*  HCT 38.8*  MCV 84.2  PLT 253    Basic Metabolic Panel: Recent Labs  Lab 11/23/23 0504  NA 139  K 3.5  CL 104  CO2 25  GLUCOSE 134*  BUN 14  CREATININE 1.04  CALCIUM  9.1  MG 2.1    GFR: Estimated Creatinine Clearance: 117.8 mL/min (by C-G formula based on SCr of 1.04 mg/dL).   Urine analysis:    Component Value Date/Time   COLORURINE YELLOW 10/25/2023 0850   APPEARANCEUR CLEAR 10/25/2023 0850   LABSPEC 1.016 10/25/2023 0850   PHURINE 6.0 10/25/2023 0850   GLUCOSEU NEGATIVE 10/25/2023 0850   HGBUR NEGATIVE 10/25/2023 0850   BILIRUBINUR NEGATIVE 10/25/2023 0850    KETONESUR NEGATIVE 10/25/2023 0850   PROTEINUR NEGATIVE 10/25/2023 0850   NITRITE NEGATIVE 10/25/2023 0850   LEUKOCYTESUR NEGATIVE 10/25/2023 0850    Sepsis Labs: Lactic Acid, Venous    Component Value Date/Time   LATICACIDVEN 0.6 10/25/2023 1138    MICROBIOLOGY: No results found for this or any  previous visit (from the past 240 hours).   RADIOLOGY STUDIES/RESULTS: No results found.   LOS: 33 days    Donalda Applebaum, MD  [facility]    To contact the attending provider between 7A-7P or the covering provider during after hours 7P-7A, please log into the web site [email or web address] and access using universal [place] password for that web site. If you do not have the password, please call the hospital operator.  11/27/2023, 10:33 AM

## 2023-11-28 NOTE — Progress Notes (Signed)
 Mobility Specialist Progress Note;   11/28/23 1110  Mobility  Activity Ambulated with assistance in hallway  Level of Assistance Contact guard assist, steadying assist  Assistive Device None  Distance Ambulated (ft) 450 ft  Activity Response Tolerated well  Mobility Referral Yes  Mobility visit 1 Mobility  Mobility Specialist Start Time (ACUTE ONLY) 1110  Mobility Specialist Stop Time (ACUTE ONLY) 1125  Mobility Specialist Time Calculation (min) (ACUTE ONLY) 15 min   Pt eager for mobility. Required MinG assistance during ambulation for safety. C/o lower back pain towards EOS, otherwise no c/o. Pt returned back to chair with all needs met, lunch delivered.   Lauraine Erm Mobility Specialist Please contact via SecureChat or [employer] [phone]

## 2023-11-28 NOTE — Plan of Care (Signed)
   Problem: Education: Goal: Knowledge of General Education information will improve Description: Including pain rating scale, medication(s)/side effects and non-pharmacologic comfort measures Outcome: Progressing   Problem: Clinical Measurements: Goal: Will remain free from infection Outcome: Progressing   Problem: Coping: Goal: Level of anxiety will decrease Outcome: Progressing

## 2023-11-28 NOTE — Progress Notes (Signed)
 PROGRESS NOTE        PATIENT DETAILS Name: Colton Lee Age: 58 y.o. Sex: male Date of Birth: 10/28/1966 Admit Date: 10/24/2023 Admitting Physician Isaiah LITTIE Lever, NP PCP:Pcp, No  Brief Summary:  Patient is a 58 y.o.  male with history of suspected peripheral neuropathy-who presented to the ED on 12/2 with bilateral leg pain/tooth pain-found to have hypothermia and altered mental status.  Admitted to [facility]-underwent extensive workup including neuroimaging/CSF sampling/LTM EEG which were all unrevealing.  After obtaining additional collaborative history from a casual contact/friend-who confirmed that at baseline patient has disorganized thinking-it was felt that patient likely had schizophrenia.  Seen by psychiatry-trial of Risperdal  started-with no improvement-and in fact had side effects like bradykinesia/rigidity/masked facies.  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  He unfortunately is homeless-has no capacity-TOC following-APS now involved for guardianship-currently medically stable for discharge-once SNF bed available.   Significant events: 12/3>> hypothermic-somnolent-mild hypercarbia-started on BiPAP-admit to [facility] 12/4>> liberated on BiPAP.  Neuro consulted-MRI/LP recommended. MRI neg for CVA. Given Ativan  for attempted lumbar puncture-subsequently became more somnolent-transferred to ICU-intubated. 12/5>> LP by IR (CSF WBC 0, protein 96) 12/6>> extubated 12/7>> transfer back to [facility] 12/10>> Dr. 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   Significant studies: 12/2>> CXR: Bibasilar atelectasis/infiltrate 12/2>> CT head: No acute intracranial abnormality-left-sided sinusitis 12/3>> NH4: Within normal limit 12/3>> HIV: Negative 12/4>> vitamin B1: 106.1 (normal) 12/4>> vitamin  B12: 504 12/4>> RPR: Pending 12/4>> MRI brain: No acute CVA/intracranial abnormality. 12 4-12/5>> LTM EEG: Negative 12/6>> CTA head/neck: No LVO/hemodynamically significant stenosis-but motion artifact study. 12/6>> CT venogram head: No sinus thrombosis 12/8>> TSH: Normal limit 12/8>> a.m. cortisol: 10.3  Significant microbiology data: 12/2>> COVID/influenza/RSV PCR: Negative 12/2>> blood culture: Negative 12/3>> blood culture: Negative 12/3>> respiratory virus panel: Negative 12/3>> urine culture: Negative 12/4>> tracheal aspirate: Normal flora 12/4>> CSF culture: Negative 12/4>> CSF meningitis/encephalitis panel: Negative  Procedures: 12/4-12/6>> ETT 12/5>> LP under fluoroscopy  Consults: PCCM Neurology Psych   Subjective: No new events overnight  objective: Vitals: Blood pressure (!) 170/107, pulse 71, temperature 98.8 F (37.1 C), temperature source Oral, resp. rate 20, height 6' 5 (1.956 m), weight 132.1 kg, SpO2 98%.   Exam: Not in distress-sitting up in bed and eating breakfast.   Pertinent Labs/Radiology:    Latest Ref Rng & Units 11/23/2023    5:04 AM 11/16/2023   10:51 AM 11/10/2023    4:35 AM  CBC  WBC 4.0 - 10.5 K/uL 5.3  4.6  5.5   Hemoglobin 13.0 - 17.0 g/dL 87.8  87.5  88.3   Hematocrit 39.0 - 52.0 % 38.8  39.7  37.3   Platelets 150 - 400 K/uL 253  352  363     Lab Results  Component Value Date   NA 139 11/23/2023   K 3.5 11/23/2023   CL 104 11/23/2023   CO2 25 11/23/2023      Assessment/Plan: Korsakoff psychosis related dementia Extensive workup negative-see above.  Evaluated by psychiatry/neurology. Remains confused with disorganized thought process. At this time-thought to have Korsakoff psychosis at baseline (disorganized thought process-neurology able to get in touch with casual contact-[name]-[phone] on 12/10)  TOC following for guardianship/SNF-patient cannot sign out AMA-he has no capacity Both neurology/psychiatry  had signed off.  Labs were stable on 12/25. Overall patient with no behavioral issues, he is on as needed Zyprexa  otherwise avoid scheduled first-generation agents and Risperdal  due to EPS in the past  Hypothermia Due to environment/cold exposure TSH/Cortisol stable Sepsis/infection ruled out-all cultures negative  Acute hypercarbic/hypoxic respiratory failure  Aspiration pneumonia Secondary to somnolence/Ativan  use Intubated 12/4-12/6-currently stable on room air Completed a course of Augmentin  on 12/11 Respiratory status is stable. Continue inhaler.  AKI Hemodynamically mediated Resolved  Homelessness TOC following SNF planned  BMI: Estimated body mass index is 34.53 kg/m as calculated from the following:   Height as of this encounter: 6' 5 (1.956 m).   Weight as of this encounter: 132.1 kg.    Code status:   Code Status: Full Code   DVT Prophylaxis: SQ Lovenox    Family Communication: None at bedside  Disposition Plan: To be determined.  Guardianship being pursued.   Diet: Diet Order             Diet regular Room service appropriate? Yes with Assist; Fluid consistency: Thin  Diet effective now                     Antimicrobial agents: Anti-infectives (From admission, onward)    Start     Dose/Rate Route Frequency Ordered Stop   10/31/23 2200  amoxicillin -clavulanate (AUGMENTIN ) 875-125 MG per tablet 1 tablet        1 tablet Oral Every 12 hours 10/31/23 1355 11/01/23 2158   10/28/23 1200  cefTRIAXone  (ROCEPHIN ) 2 g in sodium chloride  0.9 % 100 mL IVPB  Status:  Discontinued        2 g 200 mL/hr over 30 Minutes Intravenous Every 24 hours 10/27/23 1402 10/28/23 1049   10/28/23 1145  Ampicillin -Sulbactam (UNASYN ) 3 g in sodium chloride  0.9 % 100 mL IVPB  Status:  Discontinued        3 g 200 mL/hr over 30 Minutes Intravenous Every 6 hours 10/28/23 1049 10/31/23 1355   10/27/23 0500  cefTRIAXone  (ROCEPHIN ) 2 g in sodium chloride  0.9 % 100 mL IVPB   Status:  Discontinued        2 g 200 mL/hr over 30 Minutes Intravenous Every 24 hours 10/26/23 1015 10/26/23 1103   10/27/23 0200  vancomycin  (VANCOCIN ) IVPB 1000 mg/200 mL premix  Status:  Discontinued        1,000 mg 200 mL/hr over 60 Minutes Intravenous Every 12 hours 10/26/23 1136 10/27/23 1402   10/26/23 1500  vancomycin  (VANCOCIN ) IVPB 1000 mg/200 mL premix       Placed in Followed by Linked Group   1,000 mg 200 mL/hr over 60 Minutes Intravenous  Once 10/26/23 1252 10/26/23 1651   10/26/23 1400  vancomycin  (VANCOCIN ) IVPB 1000 mg/200 mL premix       Placed in Followed by Linked Group   1,000 mg 200 mL/hr over 60 Minutes Intravenous  Once 10/26/23 1252 10/26/23 1923   10/26/23 1230  acyclovir  (ZOVIRAX ) 990 mg in dextrose  5 % 250 mL IVPB  Status:  Discontinued        10 mg/kg  98.8 kg (Adjusted) 269.8 mL/hr over 60 Minutes Intravenous Every 8 hours 10/26/23 1136 10/27/23 1402   10/26/23 1230  vancomycin  (VANCOREADY) IVPB 2000 mg/400 mL  Status:  Discontinued        2,000 mg 200 mL/hr over 120 Minutes Intravenous  Once 10/26/23 1136 10/26/23 1252   10/26/23 1200  cefTRIAXone  (ROCEPHIN ) 2 g in sodium chloride  0.9 % 100 mL  IVPB  Status:  Discontinued        2 g 200 mL/hr over 30 Minutes Intravenous Every 12 hours 10/26/23 1103 10/27/23 1402   10/26/23 1200  ampicillin  (OMNIPEN) 2 g in sodium chloride  0.9 % 100 mL IVPB  Status:  Discontinued        2 g 300 mL/hr over 20 Minutes Intravenous Every 4 hours 10/26/23 1105 10/27/23 1402   10/26/23 1115  cefTRIAXone  (ROCEPHIN ) 1 g in sodium chloride  0.9 % 100 mL IVPB  Status:  Discontinued        1 g 200 mL/hr over 30 Minutes Intravenous  Once 10/26/23 1016 10/26/23 1103   10/26/23 0500  azithromycin  (ZITHROMAX ) 500 mg in sodium chloride  0.9 % 250 mL IVPB  Status:  Discontinued        500 mg 250 mL/hr over 60 Minutes Intravenous Every 24 hours 10/25/23 0840 10/26/23 1102   10/26/23 0500  cefTRIAXone  (ROCEPHIN ) 1 g in sodium chloride  0.9  % 100 mL IVPB  Status:  Discontinued        1 g 200 mL/hr over 30 Minutes Intravenous Every 24 hours 10/25/23 0840 10/26/23 1015   10/25/23 0400  cefTRIAXone  (ROCEPHIN ) 1 g in sodium chloride  0.9 % 100 mL IVPB        1 g 200 mL/hr over 30 Minutes Intravenous  Once 10/25/23 0356 10/25/23 0459   10/25/23 0400  azithromycin  (ZITHROMAX ) 500 mg in sodium chloride  0.9 % 250 mL IVPB        500 mg 250 mL/hr over 60 Minutes Intravenous  Once 10/25/23 0356 10/25/23 0708        MEDICATIONS: Scheduled Meds:  cetaphil   Topical Daily   enoxaparin  (LOVENOX ) injection  70 mg Subcutaneous Q24H   feeding supplement  237 mL Oral BID BM   folic acid   1 mg Oral Daily   mometasone -formoterol   2 puff Inhalation BID   multivitamin with minerals  1 tablet Oral Daily   pantoprazole   40 mg Oral Daily   rosuvastatin   20 mg Oral Daily   senna-docusate  2 tablet Oral BID   sodium chloride  flush  3 mL Intravenous Q12H   thiamine   100 mg Oral Daily   Continuous Infusions:   PRN Meds:.acetaminophen  **OR** acetaminophen , naLOXone  (NARCAN )  injection, OLANZapine  **OR** OLANZapine , [DISCONTINUED] ondansetron  **OR** ondansetron  (ZOFRAN ) IV   I have personally reviewed following labs and imaging studies  LABORATORY DATA: CBC: Recent Labs  Lab 11/23/23 0504  WBC 5.3  HGB 12.1*  HCT 38.8*  MCV 84.2  PLT 253    Basic Metabolic Panel: Recent Labs  Lab 11/23/23 0504  NA 139  K 3.5  CL 104  CO2 25  GLUCOSE 134*  BUN 14  CREATININE 1.04  CALCIUM  9.1  MG 2.1    GFR: Estimated Creatinine Clearance: 117.8 mL/min (by C-G formula based on SCr of 1.04 mg/dL).   Urine analysis:    Component Value Date/Time   COLORURINE YELLOW 10/25/2023 0850   APPEARANCEUR CLEAR 10/25/2023 0850   LABSPEC 1.016 10/25/2023 0850   PHURINE 6.0 10/25/2023 0850   GLUCOSEU NEGATIVE 10/25/2023 0850   HGBUR NEGATIVE 10/25/2023 0850   BILIRUBINUR NEGATIVE 10/25/2023 0850   KETONESUR NEGATIVE 10/25/2023 0850    PROTEINUR NEGATIVE 10/25/2023 0850   NITRITE NEGATIVE 10/25/2023 0850   LEUKOCYTESUR NEGATIVE 10/25/2023 0850    Sepsis Labs: Lactic Acid, Venous    Component Value Date/Time   LATICACIDVEN 0.6 10/25/2023 1138    MICROBIOLOGY: No results found  for this or any previous visit (from the past 240 hours).   RADIOLOGY STUDIES/RESULTS: No results found.   LOS: 34 days    Donalda Applebaum, MD  [facility]    To contact the attending provider between 7A-7P or the covering provider during after hours 7P-7A, please log into the web site [email or web address] and access using universal [place] password for that web site. If you do not have the password, please call the hospital operator.  11/28/2023, 10:55 AM

## 2023-11-28 NOTE — TOC Progression Note (Signed)
 Transition of Care [facility]) - Progression Note    Patient Details  Name: Silvano Bulls MRN: [id number] Date of Birth: 10/28/1966  [facility]) CM/SW Contact  Inocente GORMAN Kindle, LCSW Phone Number: 11/28/2023, 11:38 AM  Clinical Narrative:    CSW continuing to follow. Awaiting APS next steps.    Expected Discharge Plan: Skilled Nursing Facility Barriers to Discharge: Homeless with medical needs, Inadequate or no insurance  Expected Discharge Plan and Services In-house Referral: Clinical Social Work     Living arrangements for the past 2 months: Homeless                                       Social Determinants of Health (SDOH) Interventions SDOH Screenings   Food Insecurity: Patient Unable To Answer (10/25/2023)  Housing: Patient Unable To Answer (10/25/2023)  Transportation Needs: Patient Unable To Answer (10/25/2023)  Utilities: Patient Unable To Answer (10/25/2023)  Tobacco Use: High Risk (10/27/2023)    Readmission Risk Interventions     No data to display

## 2023-11-29 MED ORDER — ORAL CARE MOUTH RINSE
15.0000 mL | OROMUCOSAL | Status: DC | PRN
  Administered 2024-02-04: 15 mL via OROMUCOSAL

## 2023-11-29 NOTE — Progress Notes (Signed)
 PROGRESS NOTE        PATIENT DETAILS Name: Colton Lee Age: 58 y.o. Sex: male Date of Birth: 10/28/1966 Admit Date: 10/24/2023 Admitting Physician Isaiah LITTIE Lever, NP PCP:Pcp, No  Brief Summary:  Patient is a 58 y.o.  male with history of suspected peripheral neuropathy-who presented to the ED on 12/2 with bilateral leg pain/tooth pain-found to have hypothermia and altered mental status.  Admitted to [facility]-underwent extensive workup including neuroimaging/CSF sampling/LTM EEG which were all unrevealing.  After obtaining additional collaborative history from a casual contact/friend-who confirmed that at baseline patient has disorganized thinking-it was felt that patient likely had schizophrenia.  Seen by psychiatry-trial of Risperdal  started-with no improvement-and in fact had side effects like bradykinesia/rigidity/masked facies.  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  He unfortunately is homeless-has no capacity-TOC following-APS now involved for guardianship-currently medically stable for discharge-once SNF bed available.   Significant events: 12/3>> hypothermic-somnolent-mild hypercarbia-started on BiPAP-admit to [facility] 12/4>> liberated on BiPAP.  Neuro consulted-MRI/LP recommended. MRI neg for CVA. Given Ativan  for attempted lumbar puncture-subsequently became more somnolent-transferred to ICU-intubated. 12/5>> LP by IR (CSF WBC 0, protein 96) 12/6>> extubated 12/7>> transfer back to [facility] 12/10>> Dr. 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   Significant studies: 12/2>> CXR: Bibasilar atelectasis/infiltrate 12/2>> CT head: No acute intracranial abnormality-left-sided sinusitis 12/3>> NH4: Within normal limit 12/3>> HIV: Negative 12/4>> vitamin B1: 106.1 (normal) 12/4>> vitamin  B12: 504 12/4>> RPR: Pending 12/4>> MRI brain: No acute CVA/intracranial abnormality. 12 4-12/5>> LTM EEG: Negative 12/6>> CTA head/neck: No LVO/hemodynamically significant stenosis-but motion artifact study. 12/6>> CT venogram head: No sinus thrombosis 12/8>> TSH: Normal limit 12/8>> a.m. cortisol: 10.3  Significant microbiology data: 12/2>> COVID/influenza/RSV PCR: Negative 12/2>> blood culture: Negative 12/3>> blood culture: Negative 12/3>> respiratory virus panel: Negative 12/3>> urine culture: Negative 12/4>> tracheal aspirate: Normal flora 12/4>> CSF culture: Negative 12/4>> CSF meningitis/encephalitis panel: Negative  Procedures: 12/4-12/6>> ETT 12/5>> LP under fluoroscopy  Consults: PCCM Neurology Psych   Subjective: No major issues overnight-remains unchanged.  Objective: Vitals: Blood pressure (!) 142/103, pulse 74, temperature 98.2 F (36.8 C), temperature source Oral, resp. rate 16, height 6' 5 (1.956 m), weight 132.1 kg, SpO2 98%.   Exam: Not in distress-sitting up in bed and eating breakfast.   Pertinent Labs/Radiology:    Latest Ref Rng & Units 11/23/2023    5:04 AM 11/16/2023   10:51 AM 11/10/2023    4:35 AM  CBC  WBC 4.0 - 10.5 K/uL 5.3  4.6  5.5   Hemoglobin 13.0 - 17.0 g/dL 87.8  87.5  88.3   Hematocrit 39.0 - 52.0 % 38.8  39.7  37.3   Platelets 150 - 400 K/uL 253  352  363     Lab Results  Component Value Date   NA 139 11/23/2023   K 3.5 11/23/2023   CL 104 11/23/2023   CO2 25 11/23/2023      Assessment/Plan: Korsakoff psychosis related dementia Extensive workup negative-see above.  Evaluated by psychiatry/neurology. Remains confused with disorganized thought process. At this time-thought to have Korsakoff psychosis at baseline (disorganized thought process-neurology able to get in touch with casual contact-[name] on 12/10)  TOC following for guardianship/SNF-patient cannot sign out AMA-he has no capacity Both  neurology/psychiatry had signed  off. Labs were stable on 12/25. Overall patient with no behavioral issues, he is on as needed Zyprexa  otherwise avoid scheduled first-generation agents and Risperdal  due to EPS in the past  Hypothermia Due to environment/cold exposure TSH/Cortisol stable Sepsis/infection ruled out-all cultures negative  Acute hypercarbic/hypoxic respiratory failure  Aspiration pneumonia Secondary to somnolence/Ativan  use Intubated 12/4-12/6-currently stable on room air Completed a course of Augmentin  on 12/11 Respiratory status is stable. Continue inhaler.  AKI Hemodynamically mediated Resolved  Homelessness TOC following SNF planned  BMI: Estimated body mass index is 34.53 kg/m as calculated from the following:   Height as of this encounter: 6' 5 (1.956 m).   Weight as of this encounter: 132.1 kg.    Code status:   Code Status: Full Code   DVT Prophylaxis: SQ Lovenox    Family Communication: None at bedside  Disposition Plan: To be determined.  Guardianship being pursued.   Diet: Diet Order             Diet regular Room service appropriate? Yes with Assist; Fluid consistency: Thin  Diet effective now                     Antimicrobial agents: Anti-infectives (From admission, onward)    Start     Dose/Rate Route Frequency Ordered Stop   10/31/23 2200  amoxicillin -clavulanate (AUGMENTIN ) 875-125 MG per tablet 1 tablet        1 tablet Oral Every 12 hours 10/31/23 1355 11/01/23 2158   10/28/23 1200  cefTRIAXone  (ROCEPHIN ) 2 g in sodium chloride  0.9 % 100 mL IVPB  Status:  Discontinued        2 g 200 mL/hr over 30 Minutes Intravenous Every 24 hours 10/27/23 1402 10/28/23 1049   10/28/23 1145  Ampicillin -Sulbactam (UNASYN ) 3 g in sodium chloride  0.9 % 100 mL IVPB  Status:  Discontinued        3 g 200 mL/hr over 30 Minutes Intravenous Every 6 hours 10/28/23 1049 10/31/23 1355   10/27/23 0500  cefTRIAXone  (ROCEPHIN ) 2 g in sodium chloride   0.9 % 100 mL IVPB  Status:  Discontinued        2 g 200 mL/hr over 30 Minutes Intravenous Every 24 hours 10/26/23 1015 10/26/23 1103   10/27/23 0200  vancomycin  (VANCOCIN ) IVPB 1000 mg/200 mL premix  Status:  Discontinued        1,000 mg 200 mL/hr over 60 Minutes Intravenous Every 12 hours 10/26/23 1136 10/27/23 1402   10/26/23 1500  vancomycin  (VANCOCIN ) IVPB 1000 mg/200 mL premix       Placed in Followed by Linked Group   1,000 mg 200 mL/hr over 60 Minutes Intravenous  Once 10/26/23 1252 10/26/23 1651   10/26/23 1400  vancomycin  (VANCOCIN ) IVPB 1000 mg/200 mL premix       Placed in Followed by Linked Group   1,000 mg 200 mL/hr over 60 Minutes Intravenous  Once 10/26/23 1252 10/26/23 1923   10/26/23 1230  acyclovir  (ZOVIRAX ) 990 mg in dextrose  5 % 250 mL IVPB  Status:  Discontinued        10 mg/kg  98.8 kg (Adjusted) 269.8 mL/hr over 60 Minutes Intravenous Every 8 hours 10/26/23 1136 10/27/23 1402   10/26/23 1230  vancomycin  (VANCOREADY) IVPB 2000 mg/400 mL  Status:  Discontinued        2,000 mg 200 mL/hr over 120 Minutes Intravenous  Once 10/26/23 1136 10/26/23 1252   10/26/23 1200  cefTRIAXone  (ROCEPHIN ) 2 g in sodium chloride  0.9 % 100  mL IVPB  Status:  Discontinued        2 g 200 mL/hr over 30 Minutes Intravenous Every 12 hours 10/26/23 1103 10/27/23 1402   10/26/23 1200  ampicillin  (OMNIPEN) 2 g in sodium chloride  0.9 % 100 mL IVPB  Status:  Discontinued        2 g 300 mL/hr over 20 Minutes Intravenous Every 4 hours 10/26/23 1105 10/27/23 1402   10/26/23 1115  cefTRIAXone  (ROCEPHIN ) 1 g in sodium chloride  0.9 % 100 mL IVPB  Status:  Discontinued        1 g 200 mL/hr over 30 Minutes Intravenous  Once 10/26/23 1016 10/26/23 1103   10/26/23 0500  azithromycin  (ZITHROMAX ) 500 mg in sodium chloride  0.9 % 250 mL IVPB  Status:  Discontinued        500 mg 250 mL/hr over 60 Minutes Intravenous Every 24 hours 10/25/23 0840 10/26/23 1102   10/26/23 0500  cefTRIAXone  (ROCEPHIN ) 1 g in  sodium chloride  0.9 % 100 mL IVPB  Status:  Discontinued        1 g 200 mL/hr over 30 Minutes Intravenous Every 24 hours 10/25/23 0840 10/26/23 1015   10/25/23 0400  cefTRIAXone  (ROCEPHIN ) 1 g in sodium chloride  0.9 % 100 mL IVPB        1 g 200 mL/hr over 30 Minutes Intravenous  Once 10/25/23 0356 10/25/23 0459   10/25/23 0400  azithromycin  (ZITHROMAX ) 500 mg in sodium chloride  0.9 % 250 mL IVPB        500 mg 250 mL/hr over 60 Minutes Intravenous  Once 10/25/23 0356 10/25/23 0708        MEDICATIONS: Scheduled Meds:  cetaphil   Topical Daily   enoxaparin  (LOVENOX ) injection  70 mg Subcutaneous Q24H   feeding supplement  237 mL Oral BID BM   folic acid   1 mg Oral Daily   mometasone -formoterol   2 puff Inhalation BID   multivitamin with minerals  1 tablet Oral Daily   pantoprazole   40 mg Oral Daily   rosuvastatin   20 mg Oral Daily   senna-docusate  2 tablet Oral BID   thiamine   100 mg Oral Daily   Continuous Infusions:   PRN Meds:.acetaminophen  **OR** acetaminophen , naLOXone  (NARCAN )  injection, OLANZapine  **OR** OLANZapine , [DISCONTINUED] ondansetron  **OR** ondansetron  (ZOFRAN ) IV, mouth rinse   I have personally reviewed following labs and imaging studies  LABORATORY DATA: CBC: Recent Labs  Lab 11/23/23 0504  WBC 5.3  HGB 12.1*  HCT 38.8*  MCV 84.2  PLT 253    Basic Metabolic Panel: Recent Labs  Lab 11/23/23 0504  NA 139  K 3.5  CL 104  CO2 25  GLUCOSE 134*  BUN 14  CREATININE 1.04  CALCIUM  9.1  MG 2.1    GFR: Estimated Creatinine Clearance: 117.8 mL/min (by C-G formula based on SCr of 1.04 mg/dL).   Urine analysis:    Component Value Date/Time   COLORURINE YELLOW 10/25/2023 0850   APPEARANCEUR CLEAR 10/25/2023 0850   LABSPEC 1.016 10/25/2023 0850   PHURINE 6.0 10/25/2023 0850   GLUCOSEU NEGATIVE 10/25/2023 0850   HGBUR NEGATIVE 10/25/2023 0850   BILIRUBINUR NEGATIVE 10/25/2023 0850   KETONESUR NEGATIVE 10/25/2023 0850   PROTEINUR NEGATIVE  10/25/2023 0850   NITRITE NEGATIVE 10/25/2023 0850   LEUKOCYTESUR NEGATIVE 10/25/2023 0850    Sepsis Labs: Lactic Acid, Venous    Component Value Date/Time   LATICACIDVEN 0.6 10/25/2023 1138    MICROBIOLOGY: No results found for this or any previous visit (from  the past 240 hours).   RADIOLOGY STUDIES/RESULTS: No results found.   LOS: 35 days    Donalda Applebaum, MD  [facility]    To contact the attending provider between 7A-7P or the covering provider during after hours 7P-7A, please log into the web site [email or web address] and access using universal [place] password for that web site. If you do not have the password, please call the hospital operator.  11/29/2023, 10:26 AM

## 2023-11-29 NOTE — Progress Notes (Signed)
 Mobility Specialist Progress Note;   11/29/23 1000  Mobility  Activity Ambulated with assistance in hallway  Level of Assistance Contact guard assist, steadying assist  Assistive Device None  Distance Ambulated (ft) 300 ft  Activity Response Tolerated well  Mobility Referral Yes  Mobility visit 1 Mobility  Mobility Specialist Start Time (ACUTE ONLY) 1000  Mobility Specialist Stop Time (ACUTE ONLY) 1015  Mobility Specialist Time Calculation (min) (ACUTE ONLY) 15 min   Pt eager for mobility. Required MinG assistance via gait belt for safety while ambulating. 1 LOB noted while ambulating, however pt able to self correct. C/o slight lower back pain at EOS. Pt returned back to chair w/ all needs met.   Lauraine Erm Mobility Specialist Please contact via SecureChat or [employer] [phone]

## 2023-11-29 NOTE — Plan of Care (Signed)
  Problem: Education: Goal: Knowledge of General Education information will improve Description: Including pain rating scale, medication(s)/side effects and non-pharmacologic comfort measures Outcome: Progressing   Problem: Clinical Measurements: Goal: Will remain free from infection Outcome: Progressing Goal: Diagnostic test results will improve Outcome: Progressing   Problem: Coping: Goal: Level of anxiety will decrease Outcome: Progressing   

## 2023-11-30 LAB — GLUCOSE, CAPILLARY
Glucose-Capillary: 66 mg/dL — ABNORMAL LOW (ref 70–99)
Glucose-Capillary: 119 mg/dL — ABNORMAL HIGH (ref 70–99)

## 2023-11-30 NOTE — Plan of Care (Signed)

## 2023-11-30 NOTE — Progress Notes (Signed)
 PROGRESS NOTE        PATIENT DETAILS Name: Colton Lee Age: 58 y.o. Sex: male Date of Birth: 10/28/1966 Admit Date: 10/24/2023 Admitting Physician Isaiah LITTIE Lever, NP PCP:Pcp, No  Brief Summary:  Patient is a 58 y.o.  male with history of suspected peripheral neuropathy-who presented to the ED on 12/2 with bilateral leg pain/tooth pain-found to have hypothermia and altered mental status.  Admitted to [facility]-underwent extensive workup including neuroimaging/CSF sampling/LTM EEG which were all unrevealing.  After obtaining additional collaborative history from a casual contact/friend-who confirmed that at baseline patient has disorganized thinking-it was felt that patient likely had schizophrenia.  Seen by psychiatry-trial of Risperdal  started-with no improvement-and in fact had side effects like bradykinesia/rigidity/masked facies.  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  He unfortunately is homeless-has no capacity-TOC following-APS now involved for guardianship-currently medically stable for discharge-once SNF bed available.   Significant events: 12/3>> hypothermic-somnolent-mild hypercarbia-started on BiPAP-admit to [facility] 12/4>> liberated on BiPAP.  Neuro consulted-MRI/LP recommended. MRI neg for CVA. Given Ativan  for attempted lumbar puncture-subsequently became more somnolent-transferred to ICU-intubated. 12/5>> LP by IR (CSF WBC 0, protein 96) 12/6>> extubated 12/7>> transfer back to [facility] 12/10>> Dr. 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   Significant studies: 12/2>> CXR: Bibasilar atelectasis/infiltrate 12/2>> CT head: No acute intracranial abnormality-left-sided sinusitis 12/3>> NH4: Within normal limit 12/3>> HIV: Negative 12/4>> vitamin B1: 106.1 (normal) 12/4>> vitamin  B12: 504 12/4>> RPR: Pending 12/4>> MRI brain: No acute CVA/intracranial abnormality. 12 4-12/5>> LTM EEG: Negative 12/6>> CTA head/neck: No LVO/hemodynamically significant stenosis-but motion artifact study. 12/6>> CT venogram head: No sinus thrombosis 12/8>> TSH: Normal limit 12/8>> a.m. cortisol: 10.3  Significant microbiology data: 12/2>> COVID/influenza/RSV PCR: Negative 12/2>> blood culture: Negative 12/3>> blood culture: Negative 12/3>> respiratory virus panel: Negative 12/3>> urine culture: Negative 12/4>> tracheal aspirate: Normal flora 12/4>> CSF culture: Negative 12/4>> CSF meningitis/encephalitis panel: Negative  Procedures: 12/4-12/6>> ETT 12/5>> LP under fluoroscopy  Consults: PCCM Neurology Psych   Subjective: Unchanged-no complaints  Objective: Vitals: Blood pressure (!) 140/96, pulse 76, temperature 98.1 F (36.7 C), temperature source Oral, resp. rate 17, height 6' 5 (1.956 m), weight 134 kg, SpO2 98%.   Exam: Confused-talking nonsensically at times-but very pleasant.   Pertinent Labs/Radiology:    Latest Ref Rng & Units 11/23/2023    5:04 AM 11/16/2023   10:51 AM 11/10/2023    4:35 AM  CBC  WBC 4.0 - 10.5 K/uL 5.3  4.6  5.5   Hemoglobin 13.0 - 17.0 g/dL 87.8  87.5  88.3   Hematocrit 39.0 - 52.0 % 38.8  39.7  37.3   Platelets 150 - 400 K/uL 253  352  363     Lab Results  Component Value Date   NA 139 11/23/2023   K 3.5 11/23/2023   CL 104 11/23/2023   CO2 25 11/23/2023      Assessment/Plan: Korsakoff psychosis related dementia Extensive workup negative-see above.  Evaluated by psychiatry/neurology. Remains confused with disorganized thought process. At this time-thought to have Korsakoff psychosis at baseline (disorganized thought process-neurology able to get in touch with casual contact-[name]-[phone] on 12/10)  TOC following for guardianship/SNF-patient cannot sign out AMA-he has no capacity Both neurology/psychiatry  had signed off. Labs were stable on 12/25.  Overall patient with no behavioral issues, he is on as needed Zyprexa  otherwise avoid scheduled first-generation agents and Risperdal  due to EPS in the past  Hypothermia Due to environment/cold exposure TSH/Cortisol stable Sepsis/infection ruled out-all cultures negative  Acute hypercarbic/hypoxic respiratory failure  Aspiration pneumonia Secondary to somnolence/Ativan  use Intubated 12/4-12/6-currently stable on room air Completed a course of Augmentin  on 12/11 Respiratory status is stable. Continue inhaler.  AKI Hemodynamically mediated Resolved  Homelessness TOC following SNF planned  BMI: Estimated body mass index is 35.03 kg/m as calculated from the following:   Height as of this encounter: 6' 5 (1.956 m).   Weight as of this encounter: 134 kg.    Code status:   Code Status: Full Code   DVT Prophylaxis: SQ Lovenox    Family Communication: None at bedside  Disposition Plan: To be determined.  Guardianship being pursued.   Diet: Diet Order             Diet regular Room service appropriate? Yes with Assist; Fluid consistency: Thin  Diet effective now                     Antimicrobial agents: Anti-infectives (From admission, onward)    Start     Dose/Rate Route Frequency Ordered Stop   10/31/23 2200  amoxicillin -clavulanate (AUGMENTIN ) 875-125 MG per tablet 1 tablet        1 tablet Oral Every 12 hours 10/31/23 1355 11/01/23 2158   10/28/23 1200  cefTRIAXone  (ROCEPHIN ) 2 g in sodium chloride  0.9 % 100 mL IVPB  Status:  Discontinued        2 g 200 mL/hr over 30 Minutes Intravenous Every 24 hours 10/27/23 1402 10/28/23 1049   10/28/23 1145  Ampicillin -Sulbactam (UNASYN ) 3 g in sodium chloride  0.9 % 100 mL IVPB  Status:  Discontinued        3 g 200 mL/hr over 30 Minutes Intravenous Every 6 hours 10/28/23 1049 10/31/23 1355   10/27/23 0500  cefTRIAXone  (ROCEPHIN ) 2 g in sodium chloride  0.9 % 100 mL IVPB   Status:  Discontinued        2 g 200 mL/hr over 30 Minutes Intravenous Every 24 hours 10/26/23 1015 10/26/23 1103   10/27/23 0200  vancomycin  (VANCOCIN ) IVPB 1000 mg/200 mL premix  Status:  Discontinued        1,000 mg 200 mL/hr over 60 Minutes Intravenous Every 12 hours 10/26/23 1136 10/27/23 1402   10/26/23 1500  vancomycin  (VANCOCIN ) IVPB 1000 mg/200 mL premix       Placed in Followed by Linked Group   1,000 mg 200 mL/hr over 60 Minutes Intravenous  Once 10/26/23 1252 10/26/23 1651   10/26/23 1400  vancomycin  (VANCOCIN ) IVPB 1000 mg/200 mL premix       Placed in Followed by Linked Group   1,000 mg 200 mL/hr over 60 Minutes Intravenous  Once 10/26/23 1252 10/26/23 1923   10/26/23 1230  acyclovir  (ZOVIRAX ) 990 mg in dextrose  5 % 250 mL IVPB  Status:  Discontinued        10 mg/kg  98.8 kg (Adjusted) 269.8 mL/hr over 60 Minutes Intravenous Every 8 hours 10/26/23 1136 10/27/23 1402   10/26/23 1230  vancomycin  (VANCOREADY) IVPB 2000 mg/400 mL  Status:  Discontinued        2,000 mg 200 mL/hr over 120 Minutes Intravenous  Once 10/26/23 1136 10/26/23 1252   10/26/23 1200  cefTRIAXone  (ROCEPHIN ) 2 g in sodium chloride  0.9 % 100 mL IVPB  Status:  Discontinued  2 g 200 mL/hr over 30 Minutes Intravenous Every 12 hours 10/26/23 1103 10/27/23 1402   10/26/23 1200  ampicillin  (OMNIPEN) 2 g in sodium chloride  0.9 % 100 mL IVPB  Status:  Discontinued        2 g 300 mL/hr over 20 Minutes Intravenous Every 4 hours 10/26/23 1105 10/27/23 1402   10/26/23 1115  cefTRIAXone  (ROCEPHIN ) 1 g in sodium chloride  0.9 % 100 mL IVPB  Status:  Discontinued        1 g 200 mL/hr over 30 Minutes Intravenous  Once 10/26/23 1016 10/26/23 1103   10/26/23 0500  azithromycin  (ZITHROMAX ) 500 mg in sodium chloride  0.9 % 250 mL IVPB  Status:  Discontinued        500 mg 250 mL/hr over 60 Minutes Intravenous Every 24 hours 10/25/23 0840 10/26/23 1102   10/26/23 0500  cefTRIAXone  (ROCEPHIN ) 1 g in sodium chloride  0.9  % 100 mL IVPB  Status:  Discontinued        1 g 200 mL/hr over 30 Minutes Intravenous Every 24 hours 10/25/23 0840 10/26/23 1015   10/25/23 0400  cefTRIAXone  (ROCEPHIN ) 1 g in sodium chloride  0.9 % 100 mL IVPB        1 g 200 mL/hr over 30 Minutes Intravenous  Once 10/25/23 0356 10/25/23 0459   10/25/23 0400  azithromycin  (ZITHROMAX ) 500 mg in sodium chloride  0.9 % 250 mL IVPB        500 mg 250 mL/hr over 60 Minutes Intravenous  Once 10/25/23 0356 10/25/23 0708        MEDICATIONS: Scheduled Meds:  cetaphil   Topical Daily   enoxaparin  (LOVENOX ) injection  70 mg Subcutaneous Q24H   feeding supplement  237 mL Oral BID BM   folic acid   1 mg Oral Daily   mometasone -formoterol   2 puff Inhalation BID   multivitamin with minerals  1 tablet Oral Daily   pantoprazole   40 mg Oral Daily   rosuvastatin   20 mg Oral Daily   senna-docusate  2 tablet Oral BID   thiamine   100 mg Oral Daily   Continuous Infusions:   PRN Meds:.acetaminophen  **OR** acetaminophen , naLOXone  (NARCAN )  injection, OLANZapine  **OR** OLANZapine , [DISCONTINUED] ondansetron  **OR** ondansetron  (ZOFRAN ) IV, mouth rinse   I have personally reviewed following labs and imaging studies  LABORATORY DATA: CBC: No results for input(s): WBC, NEUTROABS, HGB, HCT, MCV, PLT in the last 168 hours.   Basic Metabolic Panel: No results for input(s): NA, K, CL, CO2, GLUCOSE, BUN, CREATININE, CALCIUM , MG, PHOS in the last 168 hours.   GFR: Estimated Creatinine Clearance: 118.7 mL/min (by C-G formula based on SCr of 1.04 mg/dL).   Urine analysis:    Component Value Date/Time   COLORURINE YELLOW 10/25/2023 0850   APPEARANCEUR CLEAR 10/25/2023 0850   LABSPEC 1.016 10/25/2023 0850   PHURINE 6.0 10/25/2023 0850   GLUCOSEU NEGATIVE 10/25/2023 0850   HGBUR NEGATIVE 10/25/2023 0850   BILIRUBINUR NEGATIVE 10/25/2023 0850   KETONESUR NEGATIVE 10/25/2023 0850   PROTEINUR NEGATIVE 10/25/2023 0850    NITRITE NEGATIVE 10/25/2023 0850   LEUKOCYTESUR NEGATIVE 10/25/2023 0850    Sepsis Labs: Lactic Acid, Venous    Component Value Date/Time   LATICACIDVEN 0.6 10/25/2023 1138    MICROBIOLOGY: No results found for this or any previous visit (from the past 240 hours).   RADIOLOGY STUDIES/RESULTS: No results found.   LOS: 36 days    Donalda Applebaum, MD  [facility]    To contact the attending provider between 7A-7P  or the covering provider during after hours 7P-7A, please log into the web site [email or web address] and access using universal [place] password for that web site. If you do not have the password, please call the hospital operator.  11/30/2023, 11:49 AM

## 2023-11-30 NOTE — Progress Notes (Signed)
 Mobility Specialist Progress Note;   11/30/23 1040  Mobility  Activity Ambulated with assistance in hallway  Level of Assistance Contact guard assist, steadying assist  Assistive Device None  Distance Ambulated (ft) 400 ft  Activity Response Tolerated well  Mobility Referral Yes  Mobility visit 1 Mobility  Mobility Specialist Start Time (ACUTE ONLY) 1040  Mobility Specialist Stop Time (ACUTE ONLY) 1050  Mobility Specialist Time Calculation (min) (ACUTE ONLY) 10 min   Pt agreeable to mobility. Required MinG assistance during ambulation via gait belt for safety. No c/o during session. Practiced some balancing obstacles while ambulating, pt successfully completed w/ no LOB. Pt began to limp last 32ft of ambulation, however when asked if pt is in pain, pt stated no. Pt returned back to chair with all needs met.   Lauraine Erm Mobility Specialist Please contact via Secure[employer] or [employer] [phone]

## 2023-11-30 NOTE — TOC Progression Note (Signed)
 Transition of Care [facility]) - Progression Note    Patient Details  Name: Colton Lee MRN: [id number] Date of Birth: 10/28/1966  [facility]) CM/SW Contact  Inocente GORMAN Kindle, LCSW Phone Number: 11/30/2023, 8:39 AM  Clinical Narrative:    Continuing to await next steps for Guardianship from [facility].    Expected Discharge Plan: Skilled Nursing Facility Barriers to Discharge: Homeless with medical needs, Inadequate or no insurance  Expected Discharge Plan and Services In-house Referral: Clinical Social Work     Living arrangements for the past 2 months: Homeless                                       Social Determinants of Health (SDOH) Interventions SDOH Screenings   Food Insecurity: Patient Unable To Answer (10/25/2023)  Housing: Patient Unable To Answer (10/25/2023)  Transportation Needs: Patient Unable To Answer (10/25/2023)  Utilities: Patient Unable To Answer (10/25/2023)  Tobacco Use: High Risk (10/27/2023)    Readmission Risk Interventions     No data to display

## 2023-12-01 NOTE — Plan of Care (Signed)

## 2023-12-01 NOTE — Progress Notes (Signed)
 Mobility Specialist Progress Note:    12/01/23 1330  Mobility  Activity Ambulated with assistance in hallway  Level of Assistance Contact guard assist, steadying assist  Assistive Device None  Distance Ambulated (ft) 220 ft  Activity Response Tolerated well  Mobility Referral Yes  Mobility visit 1 Mobility  Mobility Specialist Start Time (ACUTE ONLY) 1320  Mobility Specialist Stop Time (ACUTE ONLY) 1330  Mobility Specialist Time Calculation (min) (ACUTE ONLY) 10 min   Pt received in chair, talking to self, agreeable to mobility session. Ambulated in hallway with CGA using gait belt. Tolerated well, asx throughout. Returned pt to chair with all needs met.   Kamrin Sibley Mobility Specialist Please contact via Special educational needs teacher or  Rehab office at [phone]

## 2023-12-01 NOTE — Progress Notes (Signed)
 PROGRESS NOTE        PATIENT DETAILS Name: Colton Lee Age: 58 y.o. Sex: male Date of Birth: 10/28/1966 Admit Date: 10/24/2023 Admitting Physician Colton LITTIE Lever, NP PCP:Pcp, No  Brief Summary:  Patient is a 58 y.o.  male with history of suspected peripheral neuropathy-who presented to the ED on 12/2 with bilateral leg pain/tooth pain-found to have hypothermia and altered mental status.  Admitted to [facility]-underwent extensive workup including neuroimaging/CSF sampling/LTM EEG which were all unrevealing.  After obtaining additional collaborative history from a casual contact/friend-who confirmed that at baseline patient has disorganized thinking-it was felt that patient likely had schizophrenia.  Seen by psychiatry-trial of Risperdal  started-with no improvement-and in fact had side effects like bradykinesia/rigidity/masked facies.  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  He unfortunately is homeless-has no capacity-TOC following-APS now involved for guardianship-currently medically stable for discharge-once SNF bed available.   Significant events: 12/3>> hypothermic-somnolent-mild hypercarbia-started on BiPAP-admit to [facility] 12/4>> liberated on BiPAP.  Neuro consulted-MRI/LP recommended. MRI neg for CVA. Given Ativan  for attempted lumbar puncture-subsequently became more somnolent-transferred to ICU-intubated. 12/5>> LP by IR (CSF WBC 0, protein 96) 12/6>> extubated 12/7>> transfer back to [facility] 12/10>> Dr. 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   Significant studies: 12/2>> CXR: Bibasilar atelectasis/infiltrate 12/2>> CT head: No acute intracranial abnormality-left-sided sinusitis 12/3>> NH4: Within normal limit 12/3>> HIV: Negative 12/4>> vitamin B1: 106.1 (normal) 12/4>> vitamin  B12: 504 12/4>> RPR: Pending 12/4>> MRI brain: No acute CVA/intracranial abnormality. 12 4-12/5>> LTM EEG: Negative 12/6>> CTA head/neck: No LVO/hemodynamically significant stenosis-but motion artifact study. 12/6>> CT venogram head: No sinus thrombosis 12/8>> TSH: Normal limit 12/8>> a.m. cortisol: 10.3  Significant microbiology data: 12/2>> COVID/influenza/RSV PCR: Negative 12/2>> blood culture: Negative 12/3>> blood culture: Negative 12/3>> respiratory virus panel: Negative 12/3>> urine culture: Negative 12/4>> tracheal aspirate: Normal flora 12/4>> CSF culture: Negative 12/4>> CSF meningitis/encephalitis panel: Negative  Procedures: 12/4-12/6>> ETT 12/5>> LP under fluoroscopy  Consults: PCCM Neurology Psych   Subjective: No major issues overnight-lying comfortably in bed.  Objective: Vitals: Blood pressure 129/77, pulse 69, temperature 98.2 F (36.8 C), temperature source Oral, resp. rate 18, height 6' 5 (1.956 m), weight 134 kg, SpO2 98%.   Exam: Confused-talking nonsensically at times-but very pleasant.   Pertinent Labs/Radiology:    Latest Ref Rng & Units 11/23/2023    5:04 AM 11/16/2023   10:51 AM 11/10/2023    4:35 AM  CBC  WBC 4.0 - 10.5 K/uL 5.3  4.6  5.5   Hemoglobin 13.0 - 17.0 g/dL 87.8  87.5  88.3   Hematocrit 39.0 - 52.0 % 38.8  39.7  37.3   Platelets 150 - 400 K/uL 253  352  363     Lab Results  Component Value Date   NA 139 11/23/2023   K 3.5 11/23/2023   CL 104 11/23/2023   CO2 25 11/23/2023      Assessment/Plan: Korsakoff psychosis related dementia Extensive workup negative-see above.  Evaluated by psychiatry/neurology. Remains confused with disorganized thought process. At this time-thought to have Korsakoff psychosis at baseline (disorganized thought process-neurology able to get in touch with casual contact-Colton [phone] on 12/10)  TOC following for guardianship/SNF-patient cannot sign out AMA-he has no capacity Both  neurology/psychiatry had signed off. Labs  were stable on 12/25. Overall patient with no behavioral issues, he is on as needed Zyprexa  otherwise avoid scheduled first-generation agents and Risperdal  due to EPS in the past  Hypothermia Due to environment/cold exposure TSH/Cortisol stable Sepsis/infection ruled out-all cultures negative  Acute hypercarbic/hypoxic respiratory failure  Aspiration pneumonia Secondary to somnolence/Ativan  use Intubated 12/4-12/6-currently stable on room air Completed a course of Augmentin  on 12/11 Respiratory status is stable. Continue inhaler.  AKI Hemodynamically mediated Resolved  Homelessness TOC following SNF planned  BMI: Estimated body mass index is 35.03 kg/m as calculated from the following:   Height as of this encounter: 6' 5 (1.956 m).   Weight as of this encounter: 134 kg.    Code status:   Code Status: Full Code   DVT Prophylaxis: SQ Lovenox    Family Communication: None at bedside  Disposition Plan: To be determined.  Guardianship being pursued.   Diet: Diet Order             Diet regular Room service appropriate? Yes with Assist; Fluid consistency: Thin  Diet effective now                     Antimicrobial agents: Anti-infectives (From admission, onward)    Start     Dose/Rate Route Frequency Ordered Stop   10/31/23 2200  amoxicillin -clavulanate (AUGMENTIN ) 875-125 MG per tablet 1 tablet        1 tablet Oral Every 12 hours 10/31/23 1355 11/01/23 2158   10/28/23 1200  cefTRIAXone  (ROCEPHIN ) 2 g in sodium chloride  0.9 % 100 mL IVPB  Status:  Discontinued        2 g 200 mL/hr over 30 Minutes Intravenous Every 24 hours 10/27/23 1402 10/28/23 1049   10/28/23 1145  Ampicillin -Sulbactam (UNASYN ) 3 g in sodium chloride  0.9 % 100 mL IVPB  Status:  Discontinued        3 g 200 mL/hr over 30 Minutes Intravenous Every 6 hours 10/28/23 1049 10/31/23 1355   10/27/23 0500  cefTRIAXone  (ROCEPHIN ) 2 g in sodium chloride   0.9 % 100 mL IVPB  Status:  Discontinued        2 g 200 mL/hr over 30 Minutes Intravenous Every 24 hours 10/26/23 1015 10/26/23 1103   10/27/23 0200  vancomycin  (VANCOCIN ) IVPB 1000 mg/200 mL premix  Status:  Discontinued        1,000 mg 200 mL/hr over 60 Minutes Intravenous Every 12 hours 10/26/23 1136 10/27/23 1402   10/26/23 1500  vancomycin  (VANCOCIN ) IVPB 1000 mg/200 mL premix       Placed in Followed by Linked Group   1,000 mg 200 mL/hr over 60 Minutes Intravenous  Once 10/26/23 1252 10/26/23 1651   10/26/23 1400  vancomycin  (VANCOCIN ) IVPB 1000 mg/200 mL premix       Placed in Followed by Linked Group   1,000 mg 200 mL/hr over 60 Minutes Intravenous  Once 10/26/23 1252 10/26/23 1923   10/26/23 1230  acyclovir  (ZOVIRAX ) 990 mg in dextrose  5 % 250 mL IVPB  Status:  Discontinued        10 mg/kg  98.8 kg (Adjusted) 269.8 mL/hr over 60 Minutes Intravenous Every 8 hours 10/26/23 1136 10/27/23 1402   10/26/23 1230  vancomycin  (VANCOREADY) IVPB 2000 mg/400 mL  Status:  Discontinued        2,000 mg 200 mL/hr over 120 Minutes Intravenous  Once 10/26/23 1136 10/26/23 1252   10/26/23 1200  cefTRIAXone  (ROCEPHIN ) 2 g in sodium chloride  0.9 % 100 mL IVPB  Status:  Discontinued        2 g 200 mL/hr over 30 Minutes Intravenous Every 12 hours 10/26/23 1103 10/27/23 1402   10/26/23 1200  ampicillin  (OMNIPEN) 2 g in sodium chloride  0.9 % 100 mL IVPB  Status:  Discontinued        2 g 300 mL/hr over 20 Minutes Intravenous Every 4 hours 10/26/23 1105 10/27/23 1402   10/26/23 1115  cefTRIAXone  (ROCEPHIN ) 1 g in sodium chloride  0.9 % 100 mL IVPB  Status:  Discontinued        1 g 200 mL/hr over 30 Minutes Intravenous  Once 10/26/23 1016 10/26/23 1103   10/26/23 0500  azithromycin  (ZITHROMAX ) 500 mg in sodium chloride  0.9 % 250 mL IVPB  Status:  Discontinued        500 mg 250 mL/hr over 60 Minutes Intravenous Every 24 hours 10/25/23 0840 10/26/23 1102   10/26/23 0500  cefTRIAXone  (ROCEPHIN ) 1 g in  sodium chloride  0.9 % 100 mL IVPB  Status:  Discontinued        1 g 200 mL/hr over 30 Minutes Intravenous Every 24 hours 10/25/23 0840 10/26/23 1015   10/25/23 0400  cefTRIAXone  (ROCEPHIN ) 1 g in sodium chloride  0.9 % 100 mL IVPB        1 g 200 mL/hr over 30 Minutes Intravenous  Once 10/25/23 0356 10/25/23 0459   10/25/23 0400  azithromycin  (ZITHROMAX ) 500 mg in sodium chloride  0.9 % 250 mL IVPB        500 mg 250 mL/hr over 60 Minutes Intravenous  Once 10/25/23 0356 10/25/23 0708        MEDICATIONS: Scheduled Meds:  cetaphil   Topical Daily   enoxaparin  (LOVENOX ) injection  70 mg Subcutaneous Q24H   feeding supplement  237 mL Oral BID BM   folic acid   1 mg Oral Daily   mometasone -formoterol   2 puff Inhalation BID   multivitamin with minerals  1 tablet Oral Daily   pantoprazole   40 mg Oral Daily   rosuvastatin   20 mg Oral Daily   senna-docusate  2 tablet Oral BID   thiamine   100 mg Oral Daily   Continuous Infusions:   PRN Meds:.acetaminophen  **OR** acetaminophen , naLOXone  (NARCAN )  injection, OLANZapine  **OR** OLANZapine , [DISCONTINUED] ondansetron  **OR** ondansetron  (ZOFRAN ) IV, mouth rinse   I have personally reviewed following labs and imaging studies  LABORATORY DATA: CBC: No results for input(s): WBC, NEUTROABS, HGB, HCT, MCV, PLT in the last 168 hours.   Basic Metabolic Panel: No results for input(s): NA, K, CL, CO2, GLUCOSE, BUN, CREATININE, CALCIUM , MG, PHOS in the last 168 hours.   GFR: Estimated Creatinine Clearance: 118.7 mL/min (by C-G formula based on SCr of 1.04 mg/dL).   Urine analysis:    Component Value Date/Time   COLORURINE YELLOW 10/25/2023 0850   APPEARANCEUR CLEAR 10/25/2023 0850   LABSPEC 1.016 10/25/2023 0850   PHURINE 6.0 10/25/2023 0850   GLUCOSEU NEGATIVE 10/25/2023 0850   HGBUR NEGATIVE 10/25/2023 0850   BILIRUBINUR NEGATIVE 10/25/2023 0850   KETONESUR NEGATIVE 10/25/2023 0850   PROTEINUR NEGATIVE  10/25/2023 0850   NITRITE NEGATIVE 10/25/2023 0850   LEUKOCYTESUR NEGATIVE 10/25/2023 0850    Sepsis Labs: Lactic Acid, Venous    Component Value Date/Time   LATICACIDVEN 0.6 10/25/2023 1138    MICROBIOLOGY: No results found for this or any previous visit (from the past 240 hours).   RADIOLOGY STUDIES/RESULTS: No results found.   LOS: 37 days    Donalda Applebaum, MD  [facility]  To contact the attending provider between 7A-7P or the covering provider during after hours 7P-7A, please log into the web site [email or web address] and access using universal [place] password for that web site. If you do not have the password, please call the hospital operator.  12/01/2023, 1:46 PM

## 2023-12-01 NOTE — Plan of Care (Signed)
   Problem: Education: Goal: Knowledge of General Education information will improve Description: Including pain rating scale, medication(s)/side effects and non-pharmacologic comfort measures Outcome: Progressing   Problem: Health Behavior/Discharge Planning: Goal: Ability to manage health-related needs will improve Outcome: Progressing   Problem: Safety: Goal: Ability to remain free from injury will improve Outcome: Progressing

## 2023-12-01 NOTE — Progress Notes (Signed)
 Mobility Specialist Progress Note;   12/01/23 0940  Mobility  Activity Ambulated with assistance in hallway  Level of Assistance Contact guard assist, steadying assist  Assistive Device None  Distance Ambulated (ft) 400 ft  Activity Response Tolerated well  Mobility Referral Yes  Mobility visit 1 Mobility  Mobility Specialist Start Time (ACUTE ONLY) 0940  Mobility Specialist Stop Time (ACUTE ONLY) 0950  Mobility Specialist Time Calculation (min) (ACUTE ONLY) 10 min   Pt eager for mobility. Required MinG assistance via gait belt for safety. Worked on some balancing tactics in hallway while ambulating, pt able to perform successfully. No c/o during session. Pt returned to chair with all needs met.   Lauraine Erm Mobility Specialist Please contact via SecureChat or [employer] [phone]

## 2023-12-02 NOTE — Progress Notes (Signed)
 Mobility Specialist Progress Note:   12/02/23 1000  Mobility  Activity Ambulated with assistance in hallway  Level of Assistance Contact guard assist, steadying assist  Assistive Device None  Distance Ambulated (ft) 250 ft  Activity Response Tolerated well  Mobility Referral Yes  Mobility visit 1 Mobility  Mobility Specialist Start Time (ACUTE ONLY) 1000  Mobility Specialist Stop Time (ACUTE ONLY) 1015  Mobility Specialist Time Calculation (min) (ACUTE ONLY) 15 min   Pt agreeable to mobility session. Required only minG assist for safety. No c/o throughout. Pt back in chair with all needs met.   Therisa Rana Mobility Specialist Please contact via SecureChat or  Rehab office at [phone]

## 2023-12-02 NOTE — Plan of Care (Signed)
  Problem: Education: Goal: Knowledge of General Education information will improve Description: Including pain rating scale, medication(s)/side effects and non-pharmacologic comfort measures Outcome: Progressing   Problem: Health Behavior/Discharge Planning: Goal: Ability to manage health-related needs will improve Outcome: Progressing   Problem: Pain Management: Goal: General experience of comfort will improve Outcome: Progressing

## 2023-12-02 NOTE — TOC Progression Note (Signed)
 Transition of Care [facility]) - Progression Note    Patient Details  Name: Colton Lee MRN: [id number] Date of Birth: 10/28/1966  [facility]) CM/SW Contact  Inocente GORMAN Kindle, LCSW Phone Number: 12/02/2023, 8:41 AM  Clinical Narrative:    CSW continuing to follow and await update from APS.    Expected Discharge Plan: Skilled Nursing Facility Barriers to Discharge: Homeless with medical needs, Inadequate or no insurance  Expected Discharge Plan and Services In-house Referral: Clinical Social Work     Living arrangements for the past 2 months: Homeless                                       Social Determinants of Health (SDOH) Interventions SDOH Screenings   Food Insecurity: Patient Unable To Answer (10/25/2023)  Housing: Patient Unable To Answer (10/25/2023)  Transportation Needs: Patient Unable To Answer (10/25/2023)  Utilities: Patient Unable To Answer (10/25/2023)  Tobacco Use: High Risk (10/27/2023)    Readmission Risk Interventions     No data to display

## 2023-12-02 NOTE — Progress Notes (Signed)
 PROGRESS NOTE        PATIENT DETAILS Name: Colton Lee Age: 58 y.o. Sex: male Date of Birth: 10/28/1966 Admit Date: 10/24/2023 Admitting Physician Isaiah LITTIE Lever, NP PCP:Pcp, No  Brief Summary:  Patient is a 58 y.o.  male with history of suspected peripheral neuropathy-who presented to the ED on 12/2 with bilateral leg pain/tooth pain-found to have hypothermia and altered mental status.  Admitted to [facility]-underwent extensive workup including neuroimaging/CSF sampling/LTM EEG which were all unrevealing.  After obtaining additional collaborative history from a casual contact/friend-who confirmed that at baseline patient has disorganized thinking-it was felt that patient likely had schizophrenia.  Seen by psychiatry-trial of Risperdal  started-with no improvement-and in fact had side effects like bradykinesia/rigidity/masked facies.  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  He unfortunately is homeless-has no capacity-TOC following-APS now involved for guardianship-currently medically stable for discharge-once SNF bed available.   Significant events: 12/3>> hypothermic-somnolent-mild hypercarbia-started on BiPAP-admit to [facility] 12/4>> liberated on BiPAP.  Neuro consulted-MRI/LP recommended. MRI neg for CVA. Given Ativan  for attempted lumbar puncture-subsequently became more somnolent-transferred to ICU-intubated. 12/5>> LP by IR (CSF WBC 0, protein 96) 12/6>> extubated 12/7>> transfer back to [facility] 12/10>> Dr. 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   Significant studies: 12/2>> CXR: Bibasilar atelectasis/infiltrate 12/2>> CT head: No acute intracranial abnormality-left-sided sinusitis 12/3>> NH4: Within normal limit 12/3>> HIV: Negative 12/4>> vitamin B1: 106.1 (normal) 12/4>> vitamin  B12: 504 12/4>> RPR: Pending 12/4>> MRI brain: No acute CVA/intracranial abnormality. 12 4-12/5>> LTM EEG: Negative 12/6>> CTA head/neck: No LVO/hemodynamically significant stenosis-but motion artifact study. 12/6>> CT venogram head: No sinus thrombosis 12/8>> TSH: Normal limit 12/8>> a.m. cortisol: 10.3  Significant microbiology data: 12/2>> COVID/influenza/RSV PCR: Negative 12/2>> blood culture: Negative 12/3>> blood culture: Negative 12/3>> respiratory virus panel: Negative 12/3>> urine culture: Negative 12/4>> tracheal aspirate: Normal flora 12/4>> CSF culture: Negative 12/4>> CSF meningitis/encephalitis panel: Negative  Procedures: 12/4-12/6>> ETT 12/5>> LP under fluoroscopy  Consults: PCCM Neurology Psych   Subjective: No major issues-unchanged.  Objective: Vitals: Blood pressure 137/86, pulse 70, temperature 98.3 F (36.8 C), temperature source Oral, resp. rate 18, height 6' 5 (1.956 m), weight (!) 136.6 kg, SpO2 98%.   Exam: Confused-nonfocal exam   Pertinent Labs/Radiology:    Latest Ref Rng & Units 11/23/2023    5:04 AM 11/16/2023   10:51 AM 11/10/2023    4:35 AM  CBC  WBC 4.0 - 10.5 K/uL 5.3  4.6  5.5   Hemoglobin 13.0 - 17.0 g/dL 87.8  87.5  88.3   Hematocrit 39.0 - 52.0 % 38.8  39.7  37.3   Platelets 150 - 400 K/uL 253  352  363     Lab Results  Component Value Date   NA 139 11/23/2023   K 3.5 11/23/2023   CL 104 11/23/2023   CO2 25 11/23/2023      Assessment/Plan: Korsakoff psychosis related dementia Extensive workup negative-see above.  Evaluated by psychiatry/neurology. Remains confused with disorganized thought process. At this time-thought to have Korsakoff psychosis at baseline (disorganized thought process-neurology able to get in touch with casual contact-[name] on 12/10)  TOC following for guardianship/SNF-patient cannot sign out AMA-he has no capacity Both neurology/psychiatry had signed off. Labs were stable  on 12/25. Overall patient with  no behavioral issues, he is on as needed Zyprexa  otherwise avoid scheduled first-generation agents and Risperdal  due to EPS in the past  Hypothermia Due to environment/cold exposure TSH/Cortisol stable Sepsis/infection ruled out-all cultures negative  Acute hypercarbic/hypoxic respiratory failure  Aspiration pneumonia Secondary to somnolence/Ativan  use Intubated 12/4-12/6-currently stable on room air Completed a course of Augmentin  on 12/11 Respiratory status is stable. Continue inhaler.  AKI Hemodynamically mediated Resolved  Homelessness TOC following SNF planned  BMI: Estimated body mass index is 35.71 kg/m as calculated from the following:   Height as of this encounter: 6' 5 (1.956 m).   Weight as of this encounter: 136.6 kg.    Code status:   Code Status: Full Code   DVT Prophylaxis: SQ Lovenox    Family Communication: None at bedside  Disposition Plan: To be determined.  Guardianship being pursued.   Diet: Diet Order             Diet regular Room service appropriate? Yes with Assist; Fluid consistency: Thin  Diet effective now                     Antimicrobial agents: Anti-infectives (From admission, onward)    Start     Dose/Rate Route Frequency Ordered Stop   10/31/23 2200  amoxicillin -clavulanate (AUGMENTIN ) 875-125 MG per tablet 1 tablet        1 tablet Oral Every 12 hours 10/31/23 1355 11/01/23 2158   10/28/23 1200  cefTRIAXone  (ROCEPHIN ) 2 g in sodium chloride  0.9 % 100 mL IVPB  Status:  Discontinued        2 g 200 mL/hr over 30 Minutes Intravenous Every 24 hours 10/27/23 1402 10/28/23 1049   10/28/23 1145  Ampicillin -Sulbactam (UNASYN ) 3 g in sodium chloride  0.9 % 100 mL IVPB  Status:  Discontinued        3 g 200 mL/hr over 30 Minutes Intravenous Every 6 hours 10/28/23 1049 10/31/23 1355   10/27/23 0500  cefTRIAXone  (ROCEPHIN ) 2 g in sodium chloride  0.9 % 100 mL IVPB  Status:  Discontinued        2  g 200 mL/hr over 30 Minutes Intravenous Every 24 hours 10/26/23 1015 10/26/23 1103   10/27/23 0200  vancomycin  (VANCOCIN ) IVPB 1000 mg/200 mL premix  Status:  Discontinued        1,000 mg 200 mL/hr over 60 Minutes Intravenous Every 12 hours 10/26/23 1136 10/27/23 1402   10/26/23 1500  vancomycin  (VANCOCIN ) IVPB 1000 mg/200 mL premix       Placed in Followed by Linked Group   1,000 mg 200 mL/hr over 60 Minutes Intravenous  Once 10/26/23 1252 10/26/23 1651   10/26/23 1400  vancomycin  (VANCOCIN ) IVPB 1000 mg/200 mL premix       Placed in Followed by Linked Group   1,000 mg 200 mL/hr over 60 Minutes Intravenous  Once 10/26/23 1252 10/26/23 1923   10/26/23 1230  acyclovir  (ZOVIRAX ) 990 mg in dextrose  5 % 250 mL IVPB  Status:  Discontinued        10 mg/kg  98.8 kg (Adjusted) 269.8 mL/hr over 60 Minutes Intravenous Every 8 hours 10/26/23 1136 10/27/23 1402   10/26/23 1230  vancomycin  (VANCOREADY) IVPB 2000 mg/400 mL  Status:  Discontinued        2,000 mg 200 mL/hr over 120 Minutes Intravenous  Once 10/26/23 1136 10/26/23 1252   10/26/23 1200  cefTRIAXone  (ROCEPHIN ) 2 g in sodium chloride  0.9 % 100 mL IVPB  Status:  Discontinued  2 g 200 mL/hr over 30 Minutes Intravenous Every 12 hours 10/26/23 1103 10/27/23 1402   10/26/23 1200  ampicillin  (OMNIPEN) 2 g in sodium chloride  0.9 % 100 mL IVPB  Status:  Discontinued        2 g 300 mL/hr over 20 Minutes Intravenous Every 4 hours 10/26/23 1105 10/27/23 1402   10/26/23 1115  cefTRIAXone  (ROCEPHIN ) 1 g in sodium chloride  0.9 % 100 mL IVPB  Status:  Discontinued        1 g 200 mL/hr over 30 Minutes Intravenous  Once 10/26/23 1016 10/26/23 1103   10/26/23 0500  azithromycin  (ZITHROMAX ) 500 mg in sodium chloride  0.9 % 250 mL IVPB  Status:  Discontinued        500 mg 250 mL/hr over 60 Minutes Intravenous Every 24 hours 10/25/23 0840 10/26/23 1102   10/26/23 0500  cefTRIAXone  (ROCEPHIN ) 1 g in sodium chloride  0.9 % 100 mL IVPB  Status:   Discontinued        1 g 200 mL/hr over 30 Minutes Intravenous Every 24 hours 10/25/23 0840 10/26/23 1015   10/25/23 0400  cefTRIAXone  (ROCEPHIN ) 1 g in sodium chloride  0.9 % 100 mL IVPB        1 g 200 mL/hr over 30 Minutes Intravenous  Once 10/25/23 0356 10/25/23 0459   10/25/23 0400  azithromycin  (ZITHROMAX ) 500 mg in sodium chloride  0.9 % 250 mL IVPB        500 mg 250 mL/hr over 60 Minutes Intravenous  Once 10/25/23 0356 10/25/23 0708        MEDICATIONS: Scheduled Meds:  cetaphil   Topical Daily   enoxaparin  (LOVENOX ) injection  70 mg Subcutaneous Q24H   feeding supplement  237 mL Oral BID BM   folic acid   1 mg Oral Daily   mometasone -formoterol   2 puff Inhalation BID   multivitamin with minerals  1 tablet Oral Daily   pantoprazole   40 mg Oral Daily   rosuvastatin   20 mg Oral Daily   senna-docusate  2 tablet Oral BID   thiamine   100 mg Oral Daily   Continuous Infusions:   PRN Meds:.acetaminophen  **OR** acetaminophen , naLOXone  (NARCAN )  injection, OLANZapine  **OR** OLANZapine , [DISCONTINUED] ondansetron  **OR** ondansetron  (ZOFRAN ) IV, mouth rinse   I have personally reviewed following labs and imaging studies  LABORATORY DATA: CBC: No results for input(s): WBC, NEUTROABS, HGB, HCT, MCV, PLT in the last 168 hours.   Basic Metabolic Panel: No results for input(s): NA, K, CL, CO2, GLUCOSE, BUN, CREATININE, CALCIUM , MG, PHOS in the last 168 hours.   GFR: Estimated Creatinine Clearance: 119.8 mL/min (by C-G formula based on SCr of 1.04 mg/dL).   Urine analysis:    Component Value Date/Time   COLORURINE YELLOW 10/25/2023 0850   APPEARANCEUR CLEAR 10/25/2023 0850   LABSPEC 1.016 10/25/2023 0850   PHURINE 6.0 10/25/2023 0850   GLUCOSEU NEGATIVE 10/25/2023 0850   HGBUR NEGATIVE 10/25/2023 0850   BILIRUBINUR NEGATIVE 10/25/2023 0850   KETONESUR NEGATIVE 10/25/2023 0850   PROTEINUR NEGATIVE 10/25/2023 0850   NITRITE NEGATIVE 10/25/2023  0850   LEUKOCYTESUR NEGATIVE 10/25/2023 0850    Sepsis Labs: Lactic Acid, Venous    Component Value Date/Time   LATICACIDVEN 0.6 10/25/2023 1138    MICROBIOLOGY: No results found for this or any previous visit (from the past 240 hours).   RADIOLOGY STUDIES/RESULTS: No results found.   LOS: 38 days    Donalda Applebaum, MD  [facility]    To contact the attending provider between 7A-7P  or the covering provider during after hours 7P-7A, please log into the web site [email or web address] and access using universal [place] password for that web site. If you do not have the password, please call the hospital operator.  12/02/2023, 11:31 AM

## 2023-12-02 NOTE — Plan of Care (Signed)
 Pt has rested quietly throughout the night with no distress noted. Alert and oriented to person and place. On room air. Pt is not on tele. Up to BR. Eating and drinking coffee frequently. No complaints voiced.     Problem: Activity: Goal: Risk for activity intolerance will decrease Outcome: Progressing   Problem: Coping: Goal: Level of anxiety will decrease Outcome: Progressing   Problem: Elimination: Goal: Will not experience complications related to bowel motility Outcome: Progressing   Problem: Elimination: Goal: Will not experience complications related to urinary retention Outcome: Progressing   Problem: Pain Management: Goal: General experience of comfort will improve Outcome: Progressing

## 2023-12-03 NOTE — Plan of Care (Signed)
 Pt has rested quietly throughout the night with no distress noted. Alert and oriented to person and place. On room air. Pt is not on tele. Up to BR to void. Eating and drinking well. No complaints voiced.     Problem: Clinical Measurements: Goal: Ability to maintain clinical measurements within normal limits will improve Outcome: Progressing   Problem: Elimination: Goal: Will not experience complications related to bowel motility Outcome: Progressing   Problem: Pain Management: Goal: General experience of comfort will improve Outcome: Progressing

## 2023-12-03 NOTE — Progress Notes (Signed)
 PROGRESS NOTE        PATIENT DETAILS Name: Colton Lee Age: 58 y.o. Sex: male Date of Birth: 10/28/1966 Admit Date: 10/24/2023 Admitting Physician Isaiah LITTIE Lever, NP PCP:Pcp, No  Brief Summary:  Patient is a 58 y.o.  male with history of suspected peripheral neuropathy-who presented to the ED on 12/2 with bilateral leg pain/tooth pain-found to have hypothermia and altered mental status.  Admitted to [facility]-underwent extensive workup including neuroimaging/CSF sampling/LTM EEG which were all unrevealing.  After obtaining additional collaborative history from a casual contact/friend-who confirmed that at baseline patient has disorganized thinking-it was felt that patient likely had schizophrenia.  Seen by psychiatry-trial of Risperdal  started-with no improvement-and in fact had side effects like bradykinesia/rigidity/masked facies.  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  He unfortunately is homeless-has no capacity-TOC following-APS now involved for guardianship-currently medically stable for discharge-once SNF bed available.   Significant events: 12/3>> hypothermic-somnolent-mild hypercarbia-started on BiPAP-admit to [facility] 12/4>> liberated on BiPAP.  Neuro consulted-MRI/LP recommended. MRI neg for CVA. Given Ativan  for attempted lumbar puncture-subsequently became more somnolent-transferred to ICU-intubated. 12/5>> LP by IR (CSF WBC 0, protein 96) 12/6>> extubated 12/7>> transfer back to [facility] 12/10>> Dr. 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   Significant studies: 12/2>> CXR: Bibasilar atelectasis/infiltrate 12/2>> CT head: No acute intracranial abnormality-left-sided sinusitis 12/3>> NH4: Within normal limit 12/3>> HIV: Negative 12/4>> vitamin B1: 106.1 (normal) 12/4>> vitamin  B12: 504 12/4>> RPR: Pending 12/4>> MRI brain: No acute CVA/intracranial abnormality. 12 4-12/5>> LTM EEG: Negative 12/6>> CTA head/neck: No LVO/hemodynamically significant stenosis-but motion artifact study. 12/6>> CT venogram head: No sinus thrombosis 12/8>> TSH: Normal limit 12/8>> a.m. cortisol: 10.3  Significant microbiology data: 12/2>> COVID/influenza/RSV PCR: Negative 12/2>> blood culture: Negative 12/3>> blood culture: Negative 12/3>> respiratory virus panel: Negative 12/3>> urine culture: Negative 12/4>> tracheal aspirate: Normal flora 12/4>> CSF culture: Negative 12/4>> CSF meningitis/encephalitis panel: Negative  Procedures: 12/4-12/6>> ETT 12/5>> LP under fluoroscopy  Consults: PCCM Neurology Psych   Subjective: No major issues-appears unchanged  Objective: Vitals: Blood pressure 107/79, pulse 85, temperature 98 F (36.7 C), temperature source Oral, resp. rate 16, height 6' 5 (1.956 m), weight (!) 136.6 kg, SpO2 94%.   Exam: Awake-confusion-not in any distress Nonfocal exam  Pertinent Labs/Radiology:    Latest Ref Rng & Units 11/23/2023    5:04 AM 11/16/2023   10:51 AM 11/10/2023    4:35 AM  CBC  WBC 4.0 - 10.5 K/uL 5.3  4.6  5.5   Hemoglobin 13.0 - 17.0 g/dL 87.8  87.5  88.3   Hematocrit 39.0 - 52.0 % 38.8  39.7  37.3   Platelets 150 - 400 K/uL 253  352  363     Lab Results  Component Value Date   NA 139 11/23/2023   K 3.5 11/23/2023   CL 104 11/23/2023   CO2 25 11/23/2023      Assessment/Plan: Korsakoff psychosis related dementia Extensive workup negative-see above.  Evaluated by psychiatry/neurology. Remains confused with disorganized thought process. At this time-thought to have Korsakoff psychosis at baseline (disorganized thought process-neurology able to get in touch with casual contact-[name]-[phone] on 12/10)  TOC following for guardianship/SNF-patient cannot sign out AMA-he has no capacity Both neurology/psychiatry  had signed off. Labs were stable on  12/25. Overall patient with no behavioral issues, he is on as needed Zyprexa  otherwise avoid scheduled first-generation agents and Risperdal  due to EPS in the past  Hypothermia Due to environment/cold exposure TSH/Cortisol stable Sepsis/infection ruled out-all cultures negative  Acute hypercarbic/hypoxic respiratory failure  Aspiration pneumonia Secondary to somnolence/Ativan  use Intubated 12/4-12/6-currently stable on room air Completed a course of Augmentin  on 12/11 Respiratory status is stable. Continue inhaler.  AKI Hemodynamically mediated Resolved  Homelessness TOC following SNF planned  BMI: Estimated body mass index is 35.71 kg/m as calculated from the following:   Height as of this encounter: 6' 5 (1.956 m).   Weight as of this encounter: 136.6 kg.    Code status:   Code Status: Full Code   DVT Prophylaxis: SQ Lovenox    Family Communication: None at bedside  Disposition Plan: To be determined.  Guardianship being pursued.   Diet: Diet Order             Diet regular Room service appropriate? Yes with Assist; Fluid consistency: Thin  Diet effective now                     Antimicrobial agents: Anti-infectives (From admission, onward)    Start     Dose/Rate Route Frequency Ordered Stop   10/31/23 2200  amoxicillin -clavulanate (AUGMENTIN ) 875-125 MG per tablet 1 tablet        1 tablet Oral Every 12 hours 10/31/23 1355 11/01/23 2158   10/28/23 1200  cefTRIAXone  (ROCEPHIN ) 2 g in sodium chloride  0.9 % 100 mL IVPB  Status:  Discontinued        2 g 200 mL/hr over 30 Minutes Intravenous Every 24 hours 10/27/23 1402 10/28/23 1049   10/28/23 1145  Ampicillin -Sulbactam (UNASYN ) 3 g in sodium chloride  0.9 % 100 mL IVPB  Status:  Discontinued        3 g 200 mL/hr over 30 Minutes Intravenous Every 6 hours 10/28/23 1049 10/31/23 1355   10/27/23 0500  cefTRIAXone  (ROCEPHIN ) 2 g in sodium chloride  0.9 % 100 mL IVPB   Status:  Discontinued        2 g 200 mL/hr over 30 Minutes Intravenous Every 24 hours 10/26/23 1015 10/26/23 1103   10/27/23 0200  vancomycin  (VANCOCIN ) IVPB 1000 mg/200 mL premix  Status:  Discontinued        1,000 mg 200 mL/hr over 60 Minutes Intravenous Every 12 hours 10/26/23 1136 10/27/23 1402   10/26/23 1500  vancomycin  (VANCOCIN ) IVPB 1000 mg/200 mL premix       Placed in Followed by Linked Group   1,000 mg 200 mL/hr over 60 Minutes Intravenous  Once 10/26/23 1252 10/26/23 1651   10/26/23 1400  vancomycin  (VANCOCIN ) IVPB 1000 mg/200 mL premix       Placed in Followed by Linked Group   1,000 mg 200 mL/hr over 60 Minutes Intravenous  Once 10/26/23 1252 10/26/23 1923   10/26/23 1230  acyclovir  (ZOVIRAX ) 990 mg in dextrose  5 % 250 mL IVPB  Status:  Discontinued        10 mg/kg  98.8 kg (Adjusted) 269.8 mL/hr over 60 Minutes Intravenous Every 8 hours 10/26/23 1136 10/27/23 1402   10/26/23 1230  vancomycin  (VANCOREADY) IVPB 2000 mg/400 mL  Status:  Discontinued        2,000 mg 200 mL/hr over 120 Minutes Intravenous  Once 10/26/23 1136 10/26/23 1252   10/26/23 1200  cefTRIAXone  (ROCEPHIN ) 2 g in sodium chloride  0.9 % 100 mL IVPB  Status:  Discontinued        2 g 200 mL/hr over 30 Minutes Intravenous Every 12 hours 10/26/23 1103 10/27/23 1402   10/26/23 1200  ampicillin  (OMNIPEN) 2 g in sodium chloride  0.9 % 100 mL IVPB  Status:  Discontinued        2 g 300 mL/hr over 20 Minutes Intravenous Every 4 hours 10/26/23 1105 10/27/23 1402   10/26/23 1115  cefTRIAXone  (ROCEPHIN ) 1 g in sodium chloride  0.9 % 100 mL IVPB  Status:  Discontinued        1 g 200 mL/hr over 30 Minutes Intravenous  Once 10/26/23 1016 10/26/23 1103   10/26/23 0500  azithromycin  (ZITHROMAX ) 500 mg in sodium chloride  0.9 % 250 mL IVPB  Status:  Discontinued        500 mg 250 mL/hr over 60 Minutes Intravenous Every 24 hours 10/25/23 0840 10/26/23 1102   10/26/23 0500  cefTRIAXone  (ROCEPHIN ) 1 g in sodium chloride  0.9  % 100 mL IVPB  Status:  Discontinued        1 g 200 mL/hr over 30 Minutes Intravenous Every 24 hours 10/25/23 0840 10/26/23 1015   10/25/23 0400  cefTRIAXone  (ROCEPHIN ) 1 g in sodium chloride  0.9 % 100 mL IVPB        1 g 200 mL/hr over 30 Minutes Intravenous  Once 10/25/23 0356 10/25/23 0459   10/25/23 0400  azithromycin  (ZITHROMAX ) 500 mg in sodium chloride  0.9 % 250 mL IVPB        500 mg 250 mL/hr over 60 Minutes Intravenous  Once 10/25/23 0356 10/25/23 0708        MEDICATIONS: Scheduled Meds:  cetaphil   Topical Daily   enoxaparin  (LOVENOX ) injection  70 mg Subcutaneous Q24H   feeding supplement  237 mL Oral BID BM   folic acid   1 mg Oral Daily   mometasone -formoterol   2 puff Inhalation BID   multivitamin with minerals  1 tablet Oral Daily   pantoprazole   40 mg Oral Daily   rosuvastatin   20 mg Oral Daily   senna-docusate  2 tablet Oral BID   thiamine   100 mg Oral Daily   Continuous Infusions:   PRN Meds:.acetaminophen  **OR** acetaminophen , naLOXone  (NARCAN )  injection, OLANZapine  **OR** OLANZapine , [DISCONTINUED] ondansetron  **OR** ondansetron  (ZOFRAN ) IV, mouth rinse   I have personally reviewed following labs and imaging studies  LABORATORY DATA: CBC: No results for input(s): WBC, NEUTROABS, HGB, HCT, MCV, PLT in the last 168 hours.   Basic Metabolic Panel: No results for input(s): NA, K, CL, CO2, GLUCOSE, BUN, CREATININE, CALCIUM , MG, PHOS in the last 168 hours.   GFR: Estimated Creatinine Clearance: 119.8 mL/min (by C-G formula based on SCr of 1.04 mg/dL).   Urine analysis:    Component Value Date/Time   COLORURINE YELLOW 10/25/2023 0850   APPEARANCEUR CLEAR 10/25/2023 0850   LABSPEC 1.016 10/25/2023 0850   PHURINE 6.0 10/25/2023 0850   GLUCOSEU NEGATIVE 10/25/2023 0850   HGBUR NEGATIVE 10/25/2023 0850   BILIRUBINUR NEGATIVE 10/25/2023 0850   KETONESUR NEGATIVE 10/25/2023 0850   PROTEINUR NEGATIVE 10/25/2023 0850    NITRITE NEGATIVE 10/25/2023 0850   LEUKOCYTESUR NEGATIVE 10/25/2023 0850    Sepsis Labs: Lactic Acid, Venous    Component Value Date/Time   LATICACIDVEN 0.6 10/25/2023 1138    MICROBIOLOGY: No results found for this or any previous visit (from the past 240 hours).   RADIOLOGY STUDIES/RESULTS: No results found.   LOS: 39 days    Donalda Applebaum, MD  [facility]  To contact the attending provider between 7A-7P or the covering provider during after hours 7P-7A, please log into the web site [email or web address] and access using universal  password for that web site. If you do not have the password, please call the hospital operator.  12/03/2023, 10:48 AM

## 2023-12-04 LAB — PHOSPHORUS: Phosphorus: 4.4 mg/dL (ref 2.5–4.6)

## 2023-12-04 LAB — BASIC METABOLIC PANEL WITH GFR
Anion gap: 13 (ref 5–15)
BUN: 17 mg/dL (ref 6–20)
CO2: 23 mmol/L (ref 22–32)
Calcium: 9.2 mg/dL (ref 8.9–10.3)
Chloride: 103 mmol/L (ref 98–111)
Creatinine, Ser: 0.92 mg/dL (ref 0.61–1.24)
GFR, Estimated: >60 mL/min (ref >60)
Glucose, Bld: 99 mg/dL (ref 70–99)
Potassium: 4.4 mmol/L (ref 3.5–5.1)
Sodium: 139 mmol/L (ref 135–145)

## 2023-12-04 LAB — CBC WITH DIFFERENTIAL/PLATELET
Abs Immature Granulocytes: 0.01 K/uL (ref 0.00–0.07)
Basophils Absolute: 0 K/uL (ref 0.0–0.1)
Basophils Relative: 1 %
Eosinophils Absolute: 0.1 K/uL (ref 0.0–0.5)
Eosinophils Relative: 2 %
HCT: 40.5 % (ref 39.0–52.0)
Hemoglobin: 12.8 g/dL — ABNORMAL LOW (ref 13.0–17.0)
Immature Granulocytes: 0 %
Lymphocytes Relative: 40 %
Lymphs Abs: 2.1 K/uL (ref 0.7–4.0)
MCH: 26.6 pg (ref 26.0–34.0)
MCHC: 31.6 g/dL (ref 30.0–36.0)
MCV: 84 fL (ref 80.0–100.0)
Monocytes Absolute: 0.5 K/uL (ref 0.1–1.0)
Monocytes Relative: 8 %
Neutro Abs: 2.6 K/uL (ref 1.7–7.7)
Neutrophils Relative %: 49 %
Platelets: 167 K/uL (ref 150–400)
RBC: 4.82 MIL/uL (ref 4.22–5.81)
RDW: 15.2 % (ref 11.5–15.5)
WBC: 5.4 K/uL (ref 4.0–10.5)
nRBC: 0 % (ref 0.0–0.2)

## 2023-12-04 LAB — MAGNESIUM: Magnesium: 2 mg/dL (ref 1.7–2.4)

## 2023-12-04 NOTE — Progress Notes (Signed)
 PROGRESS NOTE        PATIENT DETAILS Name: Colton Lee Age: 58 y.o. Sex: male Date of Birth: 10/28/1966 Admit Date: 10/24/2023 Admitting Physician Isaiah LITTIE Lever, NP PCP:Pcp, No  Brief Summary:  Patient is a 57 y.o.  male with history of suspected peripheral neuropathy-who presented to the ED on 12/2 with bilateral leg pain/tooth pain-found to have hypothermia and altered mental status.  Admitted to [facility]-underwent extensive workup including neuroimaging/CSF sampling/LTM EEG which were all unrevealing.  After obtaining additional collaborative history from a casual contact/friend-who confirmed that at baseline patient has disorganized thinking-it was felt that patient likely had schizophrenia.  Seen by psychiatry-trial of Risperdal  started-with no improvement-and in fact had side effects like bradykinesia/rigidity/masked facies.  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  He unfortunately is homeless-has no capacity-TOC following-APS now involved for guardianship-currently medically stable for discharge-once SNF bed available.   Significant events: 12/3>> hypothermic-somnolent-mild hypercarbia-started on BiPAP-admit to [facility] 12/4>> liberated on BiPAP.  Neuro consulted-MRI/LP recommended. MRI neg for CVA. Given Ativan  for attempted lumbar puncture-subsequently became more somnolent-transferred to ICU-intubated. 12/5>> LP by IR (CSF WBC 0, protein 96) 12/6>> extubated 12/7>> transfer back to [facility] 12/10>> Dr. 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   Significant studies: 12/2>> CXR: Bibasilar atelectasis/infiltrate 12/2>> CT head: No acute intracranial abnormality-left-sided sinusitis 12/3>> NH4: Within normal limit 12/3>> HIV: Negative 12/4>> vitamin B1: 106.1 (normal) 12/4>> vitamin  B12: 504 12/4>> RPR: Pending 12/4>> MRI brain: No acute CVA/intracranial abnormality. 12 4-12/5>> LTM EEG: Negative 12/6>> CTA head/neck: No LVO/hemodynamically significant stenosis-but motion artifact study. 12/6>> CT venogram head: No sinus thrombosis 12/8>> TSH: Normal limit 12/8>> a.m. cortisol: 10.3  Significant microbiology data: 12/2>> COVID/influenza/RSV PCR: Negative 12/2>> blood culture: Negative 12/3>> blood culture: Negative 12/3>> respiratory virus panel: Negative 12/3>> urine culture: Negative 12/4>> tracheal aspirate: Normal flora 12/4>> CSF culture: Negative 12/4>> CSF meningitis/encephalitis panel: Negative  Procedures: 12/4-12/6>> ETT 12/5>> LP under fluoroscopy  Consults: PCCM Neurology Psych   Subjective:  Patient in bed, appears comfortable, denies any headache, no fever, no chest pain or pressure, no shortness of breath , no abdominal pain. No new focal weakness.   Objective: Vitals: Blood pressure (!) 140/117, pulse 81, temperature 98 F (36.7 C), resp. rate 18, height 6' 5 (1.956 m), weight (!) 136.6 kg, SpO2 93%.   Exam:   Awake, oriented x 1, bizarre ideation No focal deficits CTAB RRR Soft abdomen nontender No edema   Assessment/Plan: Korsakoff psychosis related dementia Extensive workup negative-see above.  Evaluated by psychiatry/neurology. Remains confused with disorganized thought process. At this time-thought to have Korsakoff psychosis at baseline (disorganized thought process-neurology able to get in touch with casual contact-[name]-[phone] on 12/10)  TOC following for guardianship/SNF-patient cannot sign out AMA-he has no capacity Both neurology/psychiatry had signed off. Labs were stable on 12/25. Overall patient with no behavioral issues, he is on as needed Zyprexa  otherwise avoid scheduled first-generation agents and Risperdal  due to EPS in the past  Hypothermia Due to environment/cold exposure TSH/Cortisol  stable Sepsis/infection ruled out-all cultures negative  Acute hypercarbic/hypoxic respiratory failure  Aspiration pneumonia Secondary to somnolence/Ativan  use Intubated 12/4-12/6-currently stable on room air Completed a course of Augmentin  on 12/11 Respiratory status is stable. Continue inhaler.  AKI Hemodynamically mediated Resolved  Homelessness TOC following SNF planned  BMI: Estimated body mass index is 35.71 kg/m as calculated from the following:   Height as of this encounter: 6' 5 (1.956 m).   Weight as of this encounter: 136.6 kg.    Code status:   Code Status: Full Code   DVT Prophylaxis: SQ Lovenox    Family Communication: None at bedside  Disposition Plan: To be determined.  Guardianship being pursued.   Diet: Diet Order             Diet regular Room service appropriate? Yes with Assist; Fluid consistency: Thin  Diet effective now                     MEDICATIONS: Scheduled Meds:  cetaphil   Topical Daily   enoxaparin  (LOVENOX ) injection  70 mg Subcutaneous Q24H   feeding supplement  237 mL Oral BID BM   folic acid   1 mg Oral Daily   mometasone -formoterol   2 puff Inhalation BID   multivitamin with minerals  1 tablet Oral Daily   pantoprazole   40 mg Oral Daily   rosuvastatin   20 mg Oral Daily   senna-docusate  2 tablet Oral BID   thiamine   100 mg Oral Daily   Continuous Infusions:   PRN Meds:.acetaminophen  **OR** acetaminophen , naLOXone  (NARCAN )  injection, OLANZapine  **OR** OLANZapine , [DISCONTINUED] ondansetron  **OR** ondansetron  (ZOFRAN ) IV, mouth rinse   I have personally reviewed following labs and imaging studies  LABORATORY DATA:  Recent Labs  Lab 12/04/23 0559  WBC 5.4  HGB 12.8*  HCT 40.5  PLT 167  MCV 84.0  MCH 26.6  MCHC 31.6  RDW 15.2  LYMPHSABS 2.1  MONOABS 0.5  EOSABS 0.1  BASOSABS 0.0    Recent Labs  Lab 12/04/23 0559  NA 139  K 4.4  CL 103  CO2 23  ANIONGAP 13  GLUCOSE 99  BUN 17   CREATININE 0.92  MG 2.0  CALCIUM  9.2    Lab Results  Component Value Date   CHOL 127 10/27/2023   HDL 54 10/27/2023   LDLCALC 60 10/27/2023   TRIG 66 10/27/2023   CHOLHDL 2.4 10/27/2023      Recent Labs  Lab 12/04/23 0559  MG 2.0  CALCIUM  9.2    MICROBIOLOGY: No results found for this or any previous visit (from the past 240 hours).   RADIOLOGY STUDIES/RESULTS: No results found.   LOS: 40 days   Signature  -    Lavada Stank M.D on 12/04/2023 at 10:03 AM   -  To page go to [email or web address]

## 2023-12-04 NOTE — Plan of Care (Signed)
  Problem: Clinical Measurements: Goal: Will remain free from infection Outcome: Progressing   Problem: Nutrition: Goal: Adequate nutrition will be maintained Outcome: Progressing   Problem: Coping: Goal: Level of anxiety will decrease Outcome: Progressing   Problem: Elimination: Goal: Will not experience complications related to urinary retention Outcome: Progressing   Problem: Pain Management: Goal: General experience of comfort will improve Outcome: Progressing

## 2023-12-04 NOTE — Progress Notes (Signed)
 Mobility Specialist Progress Note;   12/04/23 1020  Mobility  Activity Ambulated with assistance in hallway  Level of Assistance Contact guard assist, steadying assist  Assistive Device None  Distance Ambulated (ft) 400 ft  Activity Response Tolerated well  Mobility Referral Yes  Mobility visit 1 Mobility  Mobility Specialist Start Time (ACUTE ONLY) 1020  Mobility Specialist Stop Time (ACUTE ONLY) 1037  Mobility Specialist Time Calculation (min) (ACUTE ONLY) 17 min   Pt eager for mobility. Required MinG assistance throughout ambulation for safety. No c/o during session. Asx throughout. Pt returned safely back to chair with all needs met.   Lauraine Erm Mobility Specialist Please contact via SecureChat or [employer] [phone]

## 2023-12-05 NOTE — Progress Notes (Addendum)
   12/05/23 1056  Mobility  Activity Ambulated with assistance in hallway  Level of Assistance Standby assist, set-up cues, supervision of patient - no hands on  Assistive Device None  Distance Ambulated (ft) 375 ft  Activity Response Tolerated well  Mobility Referral Yes  Mobility visit 1 Mobility  Mobility Specialist Start Time (ACUTE ONLY) 1041  Mobility Specialist Stop Time (ACUTE ONLY) 1056  Mobility Specialist Time Calculation (min) (ACUTE ONLY) 15 min   Mobility Specialist: Progress Note  Pt agreeable to mobility session - received in chair. Required SB with no AD. Pt was asymptomatic throughout session with no complaints. Returned to chair with all needs met - call bell within reach.  Virgle Boards, BS Mobility Specialist Please contact via SecureChat or Rehab office at [phone].

## 2023-12-05 NOTE — Progress Notes (Signed)
 PROGRESS NOTE        PATIENT DETAILS Name: Colton Lee Age: 58 y.o. Sex: male Date of Birth: 10/28/1966 Admit Date: 10/24/2023 Admitting Physician Isaiah LITTIE Lever, NP PCP:Pcp, No  Brief Summary:  Patient is a 58 y.o.  male with history of suspected peripheral neuropathy-who presented to the ED on 12/2 with bilateral leg pain/tooth pain-found to have hypothermia and altered mental status.  Admitted to [facility]-underwent extensive workup including neuroimaging/CSF sampling/LTM EEG which were all unrevealing.  After obtaining additional collaborative history from a casual contact/friend-who confirmed that at baseline patient has disorganized thinking-it was felt that patient likely had schizophrenia.  Seen by psychiatry-trial of Risperdal  started-with no improvement-and in fact had side effects like bradykinesia/rigidity/masked facies.  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  He unfortunately is homeless-has no capacity-TOC following-APS now involved for guardianship-currently medically stable for discharge-once SNF bed available.   Significant events: 12/3>> hypothermic-somnolent-mild hypercarbia-started on BiPAP-admit to [facility] 12/4>> liberated on BiPAP.  Neuro consulted-MRI/LP recommended. MRI neg for CVA. Given Ativan  for attempted lumbar puncture-subsequently became more somnolent-transferred to ICU-intubated. 12/5>> LP by IR (CSF WBC 0, protein 96) 12/6>> extubated 12/7>> transfer back to [facility] 12/10>> Dr. 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   Significant studies: 12/2>> CXR: Bibasilar atelectasis/infiltrate 12/2>> CT head: No acute intracranial abnormality-left-sided sinusitis 12/3>> NH4: Within normal limit 12/3>> HIV: Negative 12/4>> vitamin B1: 106.1 (normal) 12/4>> vitamin  B12: 504 12/4>> RPR: Pending 12/4>> MRI brain: No acute CVA/intracranial abnormality. 12 4-12/5>> LTM EEG: Negative 12/6>> CTA head/neck: No LVO/hemodynamically significant stenosis-but motion artifact study. 12/6>> CT venogram head: No sinus thrombosis 12/8>> TSH: Normal limit 12/8>> a.m. cortisol: 10.3  Significant microbiology data: 12/2>> COVID/influenza/RSV PCR: Negative 12/2>> blood culture: Negative 12/3>> blood culture: Negative 12/3>> respiratory virus panel: Negative 12/3>> urine culture: Negative 12/4>> tracheal aspirate: Normal flora 12/4>> CSF culture: Negative 12/4>> CSF meningitis/encephalitis panel: Negative  Procedures: 12/4-12/6>> ETT 12/5>> LP under fluoroscopy  Consults: PCCM Neurology Psych   Subjective:  Patient in bed, appears comfortable, denies any headache, no fever, no chest pain or pressure, no shortness of breath , no abdominal pain. No new focal weakness.   Objective: Vitals: Blood pressure (!) 105/90, pulse 74, temperature (!) 97.5 F (36.4 C), temperature source Oral, resp. rate 18, height 6' 5 (1.956 m), weight (!) 137 kg, SpO2 96%.   Exam:   Awake, oriented x 1, bizarre ideation No focal deficits CTAB RRR Soft abdomen nontender No edema   Assessment/Plan: Korsakoff psychosis related dementia Extensive workup negative-see above.  Evaluated by psychiatry/neurology. Remains confused with disorganized thought process. At this time-thought to have Korsakoff psychosis at baseline (disorganized thought process-neurology able to get in touch with casual contact-[name]-[phone] on 12/10)  TOC following for guardianship/SNF-patient cannot sign out AMA-he has no capacity Both neurology/psychiatry had signed off. Labs were stable on 12/25. Overall patient with no behavioral issues, he is on as needed Zyprexa  otherwise avoid scheduled first-generation agents and Risperdal  due to EPS in the past  Hypothermia Due to  environment/cold exposure TSH/Cortisol stable Sepsis/infection ruled out-all cultures negative  Acute hypercarbic/hypoxic respiratory failure  Aspiration pneumonia Secondary to somnolence/Ativan  use Intubated 12/4-12/6-currently stable on room air Completed a course of Augmentin  on 12/11 Respiratory status is stable. Continue inhaler.  AKI Hemodynamically mediated Resolved  Homelessness TOC following SNF planned  BMI: Estimated body mass index is 35.82 kg/m as calculated from the following:   Height as of this encounter: 6' 5 (1.956 m).   Weight as of this encounter: 137 kg.    Code status:   Code Status: Full Code   DVT Prophylaxis: SQ Lovenox    Family Communication: None at bedside  Disposition Plan: To be determined.  Guardianship being pursued.   Diet: Diet Order             Diet regular Room service appropriate? Yes with Assist; Fluid consistency: Thin  Diet effective now                     MEDICATIONS: Scheduled Meds:  cetaphil   Topical Daily   enoxaparin  (LOVENOX ) injection  70 mg Subcutaneous Q24H   feeding supplement  237 mL Oral BID BM   folic acid   1 mg Oral Daily   mometasone -formoterol   2 puff Inhalation BID   multivitamin with minerals  1 tablet Oral Daily   pantoprazole   40 mg Oral Daily   rosuvastatin   20 mg Oral Daily   senna-docusate  2 tablet Oral BID   thiamine   100 mg Oral Daily   Continuous Infusions:   PRN Meds:.acetaminophen  **OR** acetaminophen , naLOXone  (NARCAN )  injection, OLANZapine  **OR** OLANZapine , [DISCONTINUED] ondansetron  **OR** ondansetron  (ZOFRAN ) IV, mouth rinse   I have personally reviewed following labs and imaging studies  LABORATORY DATA:  Recent Labs  Lab 12/04/23 0559  WBC 5.4  HGB 12.8*  HCT 40.5  PLT 167  MCV 84.0  MCH 26.6  MCHC 31.6  RDW 15.2  LYMPHSABS 2.1  MONOABS 0.5  EOSABS 0.1  BASOSABS 0.0    Recent Labs  Lab 12/04/23 0559  NA 139  K 4.4  CL 103  CO2 23   ANIONGAP 13  GLUCOSE 99  BUN 17  CREATININE 0.92  MG 2.0  CALCIUM  9.2    Lab Results  Component Value Date   CHOL 127 10/27/2023   HDL 54 10/27/2023   LDLCALC 60 10/27/2023   TRIG 66 10/27/2023   CHOLHDL 2.4 10/27/2023      Recent Labs  Lab 12/04/23 0559  MG 2.0  CALCIUM  9.2    MICROBIOLOGY: No results found for this or any previous visit (from the past 240 hours).   RADIOLOGY STUDIES/RESULTS: No results found.   LOS: 41 days   Signature  -    Lavada Stank M.D on 12/05/2023 at 10:57 AM   -  To page go to [email or web address]

## 2023-12-05 NOTE — Plan of Care (Signed)

## 2023-12-05 NOTE — TOC Progression Note (Signed)
 Transition of Care [facility]) - Progression Note    Patient Details  Name: Colton Lee MRN: [id number] Date of Birth: 10/28/1966  [facility] CM/SW Contact  Inocente GORMAN Kindle, LCSW Phone Number: 12/05/2023, 9:20 AM  Clinical Narrative:    CSW spoke with Ms. Glennette with [facility]. She requested note stating patient does not have capacity. CSW directed her to daily progression notes from the MD as well as psychiatry notes. She stated medical records did not send those for some reason so she will reach out again.    Expected Discharge Plan: Skilled Nursing Facility Barriers to Discharge: Homeless with medical needs, Inadequate or no insurance  Expected Discharge Plan and Services In-house Referral: Clinical Social Work     Living arrangements for the past 2 months: Homeless                                       Social Determinants of Health (SDOH) Interventions SDOH Screenings   Food Insecurity: Patient Unable To Answer (10/25/2023)  Housing: Patient Unable To Answer (10/25/2023)  Transportation Needs: Patient Unable To Answer (10/25/2023)  Utilities: Patient Unable To Answer (10/25/2023)  Tobacco Use: High Risk (10/27/2023)    Readmission Risk Interventions     No data to display

## 2023-12-06 MED ORDER — ISOSORBIDE MONONITRATE ER 30 MG PO TB24
30.0000 mg | ORAL_TABLET | Freq: Every day | ORAL | Status: DC
  Administered 2023-12-06 – 2023-12-21 (×16): 30 mg via ORAL
  Filled 2023-12-06 (×16): qty 1

## 2023-12-06 NOTE — Progress Notes (Signed)
 PROGRESS NOTE        PATIENT DETAILS Name: Colton Lee Age: 58 y.o. Sex: male Date of Birth: 10/28/1966 Admit Date: 10/24/2023 Admitting Physician Isaiah LITTIE Lever, NP PCP:Pcp, No  Brief Summary:  Patient is a 58 y.o.  male with history of suspected peripheral neuropathy-who presented to the ED on 12/2 with bilateral leg pain/tooth pain-found to have hypothermia and altered mental status.  Admitted to [facility]-underwent extensive workup including neuroimaging/CSF sampling/LTM EEG which were all unrevealing.  After obtaining additional collaborative history from a casual contact/friend-who confirmed that at baseline patient has disorganized thinking-it was felt that patient likely had schizophrenia.  Seen by psychiatry-trial of Risperdal  started-with no improvement-and in fact had side effects like bradykinesia/rigidity/masked facies.  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  He unfortunately is homeless-has no capacity-TOC following-APS now involved for guardianship-currently medically stable for discharge-once SNF bed available.   Significant events: 12/3>> hypothermic-somnolent-mild hypercarbia-started on BiPAP-admit to [facility] 12/4>> liberated on BiPAP.  Neuro consulted-MRI/LP recommended. MRI neg for CVA. Given Ativan  for attempted lumbar puncture-subsequently became more somnolent-transferred to ICU-intubated. 12/5>> LP by IR (CSF WBC 0, protein 96) 12/6>> extubated 12/7>> transfer back to [facility] 12/10>> Dr. 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   Significant studies: 12/2>> CXR: Bibasilar atelectasis/infiltrate 12/2>> CT head: No acute intracranial abnormality-left-sided sinusitis 12/3>> NH4: Within normal limit 12/3>> HIV: Negative 12/4>> vitamin B1: 106.1 (normal) 12/4>> vitamin  B12: 504 12/4>> RPR: Pending 12/4>> MRI brain: No acute CVA/intracranial abnormality. 12 4-12/5>> LTM EEG: Negative 12/6>> CTA head/neck: No LVO/hemodynamically significant stenosis-but motion artifact study. 12/6>> CT venogram head: No sinus thrombosis 12/8>> TSH: Normal limit 12/8>> a.m. cortisol: 10.3  Significant microbiology data: 12/2>> COVID/influenza/RSV PCR: Negative 12/2>> blood culture: Negative 12/3>> blood culture: Negative 12/3>> respiratory virus panel: Negative 12/3>> urine culture: Negative 12/4>> tracheal aspirate: Normal flora 12/4>> CSF culture: Negative 12/4>> CSF meningitis/encephalitis panel: Negative  Procedures: 12/4-12/6>> ETT 12/5>> LP under fluoroscopy  Consults: PCCM Neurology Psych   Subjective:  Patient in bed, appears comfortable, denies any headache, no fever, no chest pain or pressure, no shortness of breath , no abdominal pain. No new focal weakness.  Objective: Vitals: Blood pressure (!) 135/93, pulse 82, temperature 98.2 F (36.8 C), temperature source Oral, resp. rate 16, height 6' 5 (1.956 m), weight (!) 136.2 kg, SpO2 97%.   Exam:   Awake, oriented x 1, bizarre ideation No focal deficits CTAB RRR Soft abdomen nontender No edema   Assessment/Plan: Korsakoff psychosis related dementia Extensive workup negative-see above.  Evaluated by psychiatry/neurology. Remains confused with disorganized thought process. At this time-thought to have Korsakoff psychosis at baseline (disorganized thought process-neurology able to get in touch with casual contact-[name]-[phone] on 12/10)  TOC following for guardianship/SNF-patient cannot sign out AMA-he has no capacity Both neurology/psychiatry had signed off. Labs were stable on 12/25. Overall patient with no behavioral issues, he is on as needed Zyprexa  otherwise avoid scheduled first-generation agents and Risperdal  due to EPS in the past  Hypothermia Due to environment/cold  exposure TSH/Cortisol stable Sepsis/infection ruled out-all cultures negative  Acute hypercarbic/hypoxic respiratory failure  Aspiration pneumonia Secondary to somnolence/Ativan  use Intubated 12/4-12/6-currently stable on room air Completed a course of Augmentin  on 12/11 Respiratory status is stable. Continue inhaler.  AKI Hemodynamically  mediated Resolved  Diastolic hypertension.  Placed on Imdur .    Homelessness TOC following SNF planned  BMI: Estimated body mass index is 35.61 kg/m as calculated from the following:   Height as of this encounter: 6' 5 (1.956 m).   Weight as of this encounter: 136.2 kg.    Code status:   Code Status: Full Code   DVT Prophylaxis: SQ Lovenox    Family Communication: None at bedside  Disposition Plan: To be determined.  Guardianship being pursued.   Diet: Diet Order             Diet regular Room service appropriate? Yes with Assist; Fluid consistency: Thin  Diet effective now                     MEDICATIONS: Scheduled Meds:  cetaphil   Topical Daily   enoxaparin  (LOVENOX ) injection  70 mg Subcutaneous Q24H   feeding supplement  237 mL Oral BID BM   folic acid   1 mg Oral Daily   isosorbide  mononitrate  30 mg Oral Daily   mometasone -formoterol   2 puff Inhalation BID   multivitamin with minerals  1 tablet Oral Daily   pantoprazole   40 mg Oral Daily   rosuvastatin   20 mg Oral Daily   senna-docusate  2 tablet Oral BID   thiamine   100 mg Oral Daily   Continuous Infusions:   PRN Meds:.acetaminophen  **OR** acetaminophen , naLOXone  (NARCAN )  injection, OLANZapine  **OR** OLANZapine , [DISCONTINUED] ondansetron  **OR** ondansetron  (ZOFRAN ) IV, mouth rinse   I have personally reviewed following labs and imaging studies  LABORATORY DATA:  Recent Labs  Lab 12/04/23 0559  WBC 5.4  HGB 12.8*  HCT 40.5  PLT 167  MCV 84.0  MCH 26.6  MCHC 31.6  RDW 15.2  LYMPHSABS 2.1  MONOABS 0.5  EOSABS 0.1  BASOSABS 0.0     Recent Labs  Lab 12/04/23 0559  NA 139  K 4.4  CL 103  CO2 23  ANIONGAP 13  GLUCOSE 99  BUN 17  CREATININE 0.92  MG 2.0  CALCIUM  9.2    Lab Results  Component Value Date   CHOL 127 10/27/2023   HDL 54 10/27/2023   LDLCALC 60 10/27/2023   TRIG 66 10/27/2023   CHOLHDL 2.4 10/27/2023      Recent Labs  Lab 12/04/23 0559  MG 2.0  CALCIUM  9.2    MICROBIOLOGY: No results found for this or any previous visit (from the past 240 hours).   RADIOLOGY STUDIES/RESULTS: No results found.   LOS: 42 days   Signature  -    Lavada Stank M.D on 12/06/2023 at 8:31 AM   -  To page go to [email or web address]

## 2023-12-06 NOTE — Progress Notes (Signed)
 Mobility Specialist Progress Note:   12/06/23 1500  Mobility  Activity Ambulated with assistance in hallway  Level of Assistance Contact guard assist, steadying assist  Assistive Device None  Distance Ambulated (ft) 375 ft  Activity Response Tolerated well  Mobility Referral Yes  Mobility visit 1 Mobility  Mobility Specialist Start Time (ACUTE ONLY) 1500  Mobility Specialist Stop Time (ACUTE ONLY) 1515  Mobility Specialist Time Calculation (min) (ACUTE ONLY) 15 min   Pt agreeable to mobility session. Required only minG assist for safety. No overt LOB noted. Pt back in chair with all needs met.   Therisa Rana Mobility Specialist Please contact via SecureChat or  Rehab office at [phone]

## 2023-12-06 NOTE — Plan of Care (Signed)
  Problem: Education: Goal: Knowledge of General Education information will improve Description: Including pain rating scale, medication(s)/side effects and non-pharmacologic comfort measures Outcome: Not Progressing   Problem: Health Behavior/Discharge Planning: Goal: Ability to manage health-related needs will improve Outcome: Not Progressing   Problem: Clinical Measurements: Goal: Ability to maintain clinical measurements within normal limits will improve Outcome: Progressing Goal: Will remain free from infection Outcome: Progressing Goal: Diagnostic test results will improve Outcome: Progressing Goal: Respiratory complications will improve Outcome: Completed/Met Goal: Cardiovascular complication will be avoided Outcome: Progressing   Problem: Activity: Goal: Risk for activity intolerance will decrease Outcome: Progressing   Problem: Nutrition: Goal: Adequate nutrition will be maintained Outcome: Progressing   Problem: Coping: Goal: Level of anxiety will decrease Outcome: Progressing   Problem: Elimination: Goal: Will not experience complications related to bowel motility Outcome: Progressing Goal: Will not experience complications related to urinary retention Outcome: Progressing   Problem: Pain Management: Goal: General experience of comfort will improve Outcome: Progressing   Problem: Safety: Goal: Ability to remain free from injury will improve Outcome: Progressing   Problem: Skin Integrity: Goal: Risk for impaired skin integrity will decrease Outcome: Progressing

## 2023-12-07 MED ORDER — AMLODIPINE BESYLATE 10 MG PO TABS
10.0000 mg | ORAL_TABLET | Freq: Every day | ORAL | Status: DC
  Administered 2023-12-07 – 2023-12-21 (×15): 10 mg via ORAL
  Filled 2023-12-07 (×15): qty 1

## 2023-12-09 LAB — TSH: TSH: 0.519 u[IU]/mL (ref 0.350–4.500)

## 2023-12-10 LAB — BASIC METABOLIC PANEL WITH GFR
Anion gap: 10 (ref 5–15)
BUN: 17 mg/dL (ref 6–20)
CO2: 25 mmol/L (ref 22–32)
Calcium: 9.2 mg/dL (ref 8.9–10.3)
Chloride: 103 mmol/L (ref 98–111)
Creatinine, Ser: 1.2 mg/dL (ref 0.61–1.24)
GFR, Estimated: >60 mL/min (ref >60)
Glucose, Bld: 165 mg/dL — ABNORMAL HIGH (ref 70–99)
Potassium: 3.7 mmol/L (ref 3.5–5.1)
Sodium: 138 mmol/L (ref 135–145)

## 2023-12-10 LAB — PHOSPHORUS: Phosphorus: 3.9 mg/dL (ref 2.5–4.6)

## 2023-12-10 LAB — CBC WITH DIFFERENTIAL/PLATELET
Abs Immature Granulocytes: 0.03 K/uL (ref 0.00–0.07)
Basophils Absolute: 0 K/uL (ref 0.0–0.1)
Basophils Relative: 1 %
Eosinophils Absolute: 0.1 K/uL (ref 0.0–0.5)
Eosinophils Relative: 2 %
HCT: 38.6 % — ABNORMAL LOW (ref 39.0–52.0)
Hemoglobin: 12.3 g/dL — ABNORMAL LOW (ref 13.0–17.0)
Immature Granulocytes: 1 %
Lymphocytes Relative: 44 %
Lymphs Abs: 2.6 K/uL (ref 0.7–4.0)
MCH: 26.5 pg (ref 26.0–34.0)
MCHC: 31.9 g/dL (ref 30.0–36.0)
MCV: 83.2 fL (ref 80.0–100.0)
Monocytes Absolute: 0.5 K/uL (ref 0.1–1.0)
Monocytes Relative: 8 %
Neutro Abs: 2.7 K/uL (ref 1.7–7.7)
Neutrophils Relative %: 44 %
Platelets: 250 K/uL (ref 150–400)
RBC: 4.64 MIL/uL (ref 4.22–5.81)
RDW: 15.2 % (ref 11.5–15.5)
WBC: 6 K/uL (ref 4.0–10.5)
nRBC: 0 % (ref 0.0–0.2)

## 2023-12-10 LAB — MAGNESIUM: Magnesium: 1.9 mg/dL (ref 1.7–2.4)

## 2023-12-13 MED ADMIN — Carvedilol Tab 3.125 MG: 3.125 mg | ORAL | NDC 00904730561

## 2023-12-13 MED FILL — Carvedilol Tab 3.125 MG: 3.1250 mg | ORAL | Qty: 1 | Status: AC

## 2023-12-14 MED ADMIN — Carvedilol Tab 3.125 MG: 3.125 mg | ORAL | NDC 00904730561

## 2023-12-15 MED ADMIN — Carvedilol Tab 3.125 MG: 3.125 mg | ORAL | NDC 00904730561

## 2023-12-16 MED ADMIN — Carvedilol Tab 3.125 MG: 3.125 mg | ORAL | NDC 00904730561

## 2023-12-16 MED ADMIN — Emollient - Lotion: 1 | TOPICAL | NDC 00299391816

## 2023-12-17 MED ADMIN — Carvedilol Tab 3.125 MG: 3.125 mg | ORAL | NDC 00904730561

## 2023-12-18 MED ADMIN — Carvedilol Tab 3.125 MG: 3.125 mg | ORAL | NDC 00904730561

## 2023-12-18 MED ADMIN — Carvedilol Tab 3.125 MG: 3.125 mg | ORAL | NDC 57664024288

## 2023-12-19 MED ADMIN — Carvedilol Tab 3.125 MG: 3.125 mg | ORAL | NDC 57664024288

## 2023-12-19 MED ADMIN — Carvedilol Tab 3.125 MG: 3.125 mg | ORAL | NDC 00904730561

## 2023-12-20 MED ADMIN — Carvedilol Tab 3.125 MG: 3.125 mg | ORAL | NDC 00904730561

## 2023-12-20 MED ADMIN — Carvedilol Tab 3.125 MG: 3.125 mg | ORAL | NDC 57664024288

## 2023-12-21 MED ORDER — CARVEDILOL 3.125 MG PO TABS
3.1250 mg | ORAL_TABLET | Freq: Two times a day (BID) | ORAL | Status: DC
  Administered 2023-12-22 – 2024-01-07 (×33): 3.125 mg via ORAL
  Filled 2023-12-21 (×32): qty 1

## 2023-12-21 MED ORDER — LORAZEPAM 2 MG/ML IJ SOLN: 1.0000 mg | INTRAMUSCULAR | Status: DC | PRN

## 2023-12-21 MED ORDER — SODIUM CHLORIDE 0.9 % IV BOLUS: 1000.0000 mL | INTRAVENOUS | Status: DC

## 2023-12-21 MED ORDER — LORAZEPAM 2 MG/ML IJ SOLN: 1.0000 mg | Freq: Four times a day (QID) | INTRAMUSCULAR | Status: DC | PRN

## 2023-12-21 MED ADMIN — Carvedilol Tab 3.125 MG: 3.125 mg | ORAL | NDC 00904730561

## 2023-12-21 MED ADMIN — Carvedilol Tab 3.125 MG: 3.125 mg | ORAL | NDC 57664024288

## 2023-12-26 LAB — BASIC METABOLIC PANEL WITH GFR
Anion gap: 10 (ref 5–15)
BUN: 17 mg/dL (ref 6–20)
CO2: 25 mmol/L (ref 22–32)
Calcium: 9.2 mg/dL (ref 8.9–10.3)
Chloride: 102 mmol/L (ref 98–111)
Creatinine, Ser: 0.87 mg/dL (ref 0.61–1.24)
GFR, Estimated: >60 mL/min (ref >60)
Glucose, Bld: 175 mg/dL — ABNORMAL HIGH (ref 70–99)
Potassium: 4 mmol/L (ref 3.5–5.1)
Sodium: 137 mmol/L (ref 135–145)

## 2023-12-26 LAB — CBC
HCT: 39.9 % (ref 39.0–52.0)
Hemoglobin: 12.6 g/dL — ABNORMAL LOW (ref 13.0–17.0)
MCH: 26.3 pg (ref 26.0–34.0)
MCHC: 31.6 g/dL (ref 30.0–36.0)
MCV: 83.3 fL (ref 80.0–100.0)
Platelets: 279 K/uL (ref 150–400)
RBC: 4.79 MIL/uL (ref 4.22–5.81)
RDW: 14.9 % (ref 11.5–15.5)
WBC: 4.9 K/uL (ref 4.0–10.5)
nRBC: 0 % (ref 0.0–0.2)

## 2023-12-27 MED ADMIN — Emollient - Lotion: 1 | TOPICAL | NDC 00299391816

## 2023-12-27 NOTE — Progress Notes (Signed)
   12/27/23 1100  Mobility  Activity Ambulated independently in hallway  Level of Assistance Standby assist, set-up cues, supervision of patient - no hands on  Assistive Device Other (Comment) (Handrails)  Distance Ambulated (ft) 2250 ft  Activity Response Tolerated fair  Mobility Referral Yes  Mobility visit 1 Mobility  Mobility Specialist Start Time (ACUTE ONLY) 1033  Mobility Specialist Stop Time (ACUTE ONLY) 1100  Mobility Specialist Time Calculation (min) (ACUTE ONLY) 27 min   Mobility Specialist: Progress Note  During Mobility: HR 104, SpO2 93- 97% RA%  Pt agreeable to mobility session - received in chair. C/o stabbing/ pinching/soreness in back. Required x4 standing rest breaks, x1 seated break. Pt stated that walk was good. Returned to chair with all needs met - call bell within reach.   Virgle Boards, BS Mobility Specialist Please contact via SecureChat or  Rehab office at [phone].

## 2023-12-28 NOTE — TOC Progression Note (Signed)
 Transition of Care [facility]) - Progression Note    Patient Details  Name: Colton Lee MRN: [id number] Date of Birth: 10/28/1966  Transition of Care [facility]) CM/SW Contact  Inocente GORMAN Kindle, LCSW Phone Number: 12/28/2023, 9:06 AM  Clinical Narrative:    TOC continuing to follow.    Expected Discharge Plan: Skilled Nursing Facility Barriers to Discharge: Homeless with medical needs, Inadequate or no insurance  Expected Discharge Plan and Services In-house Referral: Clinical Social Work     Living arrangements for the past 2 months: Homeless                                       Social Determinants of Health (SDOH) Interventions SDOH Screenings   Food Insecurity: Patient Unable To Answer (10/25/2023)  Housing: Patient Unable To Answer (10/25/2023)  Transportation Needs: Patient Unable To Answer (10/25/2023)  Utilities: Patient Unable To Answer (10/25/2023)  Tobacco Use: High Risk (10/27/2023)    Readmission Risk Interventions     No data to display

## 2023-12-28 NOTE — Plan of Care (Signed)

## 2023-12-28 NOTE — Plan of Care (Signed)
  Problem: Education: Goal: Knowledge of General Education information will improve Description: Including pain rating scale, medication(s)/side effects and non-pharmacologic comfort measures Outcome: Progressing   Problem: Health Behavior/Discharge Planning: Goal: Ability to manage health-related needs will improve Outcome: Progressing   Problem: Clinical Measurements: Goal: Ability to maintain clinical measurements within normal limits will improve Outcome: Progressing Goal: Will remain free from infection Outcome: Progressing Goal: Diagnostic test results will improve Outcome: Progressing Goal: Cardiovascular complication will be avoided Outcome: Progressing   Problem: Nutrition: Goal: Adequate nutrition will be maintained Outcome: Progressing   Problem: Activity: Goal: Risk for activity intolerance will decrease Outcome: Progressing   Problem: Elimination: Goal: Will not experience complications related to bowel motility Outcome: Progressing Goal: Will not experience complications related to urinary retention Outcome: Progressing

## 2023-12-28 NOTE — Progress Notes (Signed)
 PROGRESS NOTE        PATIENT DETAILS Name: Colton Lee Age: 58 y.o. Sex: male Date of Birth: 10/28/1966 Admit Date: 10/24/2023 Admitting Physician Isaiah LITTIE Lever, NP PCP:Pcp, No  Brief Summary:  Patient is a 58 y.o.  male with history of suspected peripheral neuropathy-who presented to the ED on 12/2 with bilateral leg pain/tooth pain-found to have hypothermia and altered mental status.  Admitted to [facility]-underwent extensive workup including neuroimaging/CSF sampling/LTM EEG which were all unrevealing.  After obtaining additional collaborative history from a casual contact/friend-who confirmed that at baseline patient has disorganized thinking-it was felt that patient likely had schizophrenia.  Seen by psychiatry-trial of Risperdal  started-with no improvement-and in fact had side effects like bradykinesia/rigidity/masked facies.  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  He unfortunately is homeless-has no capacity-TOC following-APS now involved for guardianship-currently medically stable for discharge-once SNF bed available.   Significant events: 12/3>> hypothermic-somnolent-mild hypercarbia-started on BiPAP-admit to [facility] 12/4>> liberated on BiPAP.  Neuro consulted-MRI/LP recommended. MRI neg for CVA. Given Ativan  for attempted lumbar puncture-subsequently became more somnolent-transferred to ICU-intubated. 12/5>> LP by IR (CSF WBC 0, protein 96) 12/6>> extubated 12/7>> transfer back to [facility] 12/10>> Dr. 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   Significant studies: 12/2>> CXR: Bibasilar atelectasis/infiltrate 12/2>> CT head: No acute intracranial abnormality-left-sided sinusitis 12/3>> NH4: Within normal limit 12/3>> HIV: Negative 12/4>> vitamin B1: 106.1 (normal) 12/4>> vitamin  B12: 504 12/4>> RPR: Pending 12/4>> MRI brain: No acute CVA/intracranial abnormality. 12 4-12/5>> LTM EEG: Negative 12/6>> CTA head/neck: No LVO/hemodynamically significant stenosis-but motion artifact study. 12/6>> CT venogram head: No sinus thrombosis 12/8>> TSH: Normal limit 12/8>> a.m. cortisol: 10.3  Significant microbiology data: 12/2>> COVID/influenza/RSV PCR: Negative 12/2>> blood culture: Negative 12/3>> blood culture: Negative 12/3>> respiratory virus panel: Negative 12/3>> urine culture: Negative 12/4>> tracheal aspirate: Normal flora 12/4>> CSF culture: Negative 12/4>> CSF meningitis/encephalitis panel: Negative  Procedures: 12/4-12/6>> ETT 12/5>> LP under fluoroscopy  Consults: PCCM Neurology Psych   Subjective:  Unchanged-awaiting SNF bed  Objective:  Vitals:   12/28/23 0340 12/28/23 0723 12/28/23 0829 12/28/23 1101  BP: 130/79  132/77   Pulse: 85  88   Temp: 98.4 F (36.9 C)  98.2 F (36.8 C)   Resp: 16  16   Height:      Weight:    (!) 150.9 kg  SpO2:  98%    TempSrc: Oral  Oral   BMI (Calculated):        Exam: Awake-confused Moving all 4 extremities.  Assessment/Plan:  Korsakoff psychosis related dementia Extensive workup negative-see above.  Evaluated by psychiatry/neurology. Remains confused with disorganized thought process. At this time-thought to have Korsakoff psychosis at baseline (disorganized thought process-neurology able to get in touch with casual contact-[name] on 12/10)  TOC following for guardianship/SNF-patient cannot sign out AMA-he has no capacity Both neurology/psychiatry had signed off. Labs were stable on 12/25. Overall patient with no behavioral issues, he is on as needed Zyprexa  otherwise avoid scheduled first-generation agents and Risperdal  due to EPS in the past  Hypothermia Due to environment/cold exposure TSH/Cortisol stable Sepsis/infection ruled out-all cultures negative  Acute  hypercarbic/hypoxic respiratory failure  Aspiration pneumonia Secondary to somnolence/Ativan  use Intubated 12/4-12/6-currently stable on room air Completed a course of Augmentin  on  12/11 Respiratory status is stable. Continue inhaler.  AKI Hemodynamically mediated Resolved  Essential hypertension predominantly diastolic hypertension.  stable on Coreg    Homelessness TOC following SNF planned  BMI: Estimated body mass index is 39.45 kg/m as calculated from the following:   Height as of this encounter: 6' 5 (1.956 m).   Weight as of this encounter: 150.9 kg.    Code status:   Code Status: Full Code   DVT Prophylaxis: SQ Lovenox    Family Communication: None at bedside  Disposition Plan: To be determined.  Guardianship being pursued.   Diet: Diet Order             Diet regular Room service appropriate? Yes with Assist; Fluid consistency: Thin  Diet effective now                     MEDICATIONS: Scheduled Meds:  carvedilol   3.125 mg Oral BID WC   cetaphil   Topical Daily   enoxaparin  (LOVENOX ) injection  70 mg Subcutaneous Q24H   feeding supplement  237 mL Oral BID BM   folic acid   1 mg Oral Daily   mometasone -formoterol   2 puff Inhalation BID   multivitamin with minerals  1 tablet Oral Daily   pantoprazole   40 mg Oral Daily   rosuvastatin   20 mg Oral Daily   senna-docusate  2 tablet Oral BID   thiamine   100 mg Oral Daily   Continuous Infusions:   PRN Meds:.acetaminophen  **OR** acetaminophen , LORazepam , naLOXone  (NARCAN )  injection, OLANZapine  **OR** OLANZapine , [DISCONTINUED] ondansetron  **OR** ondansetron  (ZOFRAN ) IV, mouth rinse   I have personally reviewed following labs and imaging studies  LABORATORY DATA:  Recent Labs  Lab 12/26/23 0506  WBC 4.9  HGB 12.6*  HCT 39.9  PLT 279  MCV 83.3  MCH 26.3  MCHC 31.6  RDW 14.9     Recent Labs  Lab 12/26/23 0506  NA 137  K 4.0  CL 102  CO2 25  ANIONGAP 10  GLUCOSE 175*  BUN 17   CREATININE 0.87  CALCIUM  9.2     Lab Results  Component Value Date   CHOL 127 10/27/2023   HDL 54 10/27/2023   LDLCALC 60 10/27/2023   TRIG 66 10/27/2023   CHOLHDL 2.4 10/27/2023      Recent Labs  Lab 12/26/23 0506  CALCIUM  9.2     MICROBIOLOGY: No results found for this or any previous visit (from the past 240 hours).   RADIOLOGY STUDIES/RESULTS: No results found.   LOS: 64 days   Signature  -    Donalda Applebaum M.D on 12/28/2023 at 11:07 AM   -  To page go to [email or web address]

## 2023-12-29 NOTE — Progress Notes (Signed)
 PROGRESS NOTE        PATIENT DETAILS Name: Colton Lee Age: 58 y.o. Sex: male Date of Birth: 10/28/1966 Admit Date: 10/24/2023 Admitting Physician Isaiah LITTIE Lever, NP PCP:Pcp, No  Brief Summary:  Patient is a 58 y.o.  male with history of suspected peripheral neuropathy-who presented to the ED on 12/2 with bilateral leg pain/tooth pain-found to have hypothermia and altered mental status.  Admitted to [facility]-underwent extensive workup including neuroimaging/CSF sampling/LTM EEG which were all unrevealing.  After obtaining additional collaborative history from a casual contact/friend-who confirmed that at baseline patient has disorganized thinking-it was felt that patient likely had schizophrenia.  Seen by psychiatry-trial of Risperdal  started-with no improvement-and in fact had side effects like bradykinesia/rigidity/masked facies.  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  He unfortunately is homeless-has no capacity-TOC following-APS now involved for guardianship-currently medically stable for discharge-once SNF bed available.   Significant events: 12/3>> hypothermic-somnolent-mild hypercarbia-started on BiPAP-admit to [facility] 12/4>> liberated on BiPAP.  Neuro consulted-MRI/LP recommended. MRI neg for CVA. Given Ativan  for attempted lumbar puncture-subsequently became more somnolent-transferred to ICU-intubated. 12/5>> LP by IR (CSF WBC 0, protein 96) 12/6>> extubated 12/7>> transfer back to [facility] 12/10>> Dr. 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   Significant studies: 12/2>> CXR: Bibasilar atelectasis/infiltrate 12/2>> CT head: No acute intracranial abnormality-left-sided sinusitis 12/3>> NH4: Within normal limit 12/3>> HIV: Negative 12/4>> vitamin B1: 106.1 (normal) 12/4>> vitamin  B12: 504 12/4>> RPR: Pending 12/4>> MRI brain: No acute CVA/intracranial abnormality. 12 4-12/5>> LTM EEG: Negative 12/6>> CTA head/neck: No LVO/hemodynamically significant stenosis-but motion artifact study. 12/6>> CT venogram head: No sinus thrombosis 12/8>> TSH: Normal limit 12/8>> a.m. cortisol: 10.3  Significant microbiology data: 12/2>> COVID/influenza/RSV PCR: Negative 12/2>> blood culture: Negative 12/3>> blood culture: Negative 12/3>> respiratory virus panel: Negative 12/3>> urine culture: Negative 12/4>> tracheal aspirate: Normal flora 12/4>> CSF culture: Negative 12/4>> CSF meningitis/encephalitis panel: Negative  Procedures: 12/4-12/6>> ETT 12/5>> LP under fluoroscopy  Consults: PCCM Neurology Psych   Subjective:  No acute issues overnight-sitting at bedside chair-and eating snacks  Objective:  Vitals:   12/28/23 1942 12/29/23 0037 12/29/23 0500 12/29/23 0837  BP: (!) 137/90 114/77 (!) 139/91   Pulse: 76 78 82   Temp: 98.1 F (36.7 C) 98.3 F (36.8 C) 98.4 F (36.9 C)   Resp: 18 18 18    Height:      Weight:    (!) 152.1 kg  SpO2: 94% 95% 95%   TempSrc: Oral Oral Oral   BMI (Calculated):        Exam: Awake/alert Nonfocal exam  Assessment/Plan:  Korsakoff psychosis related dementia Extensive workup negative-see above.  Evaluated by psychiatry/neurology. Remains confused with disorganized thought process. At this time-thought to have Korsakoff psychosis at baseline (disorganized thought process-neurology able to get in touch with casual contact-[name]-[phone] on 12/10)  TOC following for guardianship/SNF-patient cannot sign out AMA-he has no capacity Both neurology/psychiatry had signed off. Labs were stable on 12/25. Overall patient with no behavioral issues, he is on as needed Zyprexa  otherwise avoid scheduled first-generation agents and Risperdal  due to EPS in the past  Hypothermia Due to environment/cold exposure TSH/Cortisol  stable Sepsis/infection ruled out-all cultures negative  Acute hypercarbic/hypoxic respiratory failure  Aspiration pneumonia Secondary to somnolence/Ativan  use Intubated 12/4-12/6-currently stable  on room air Completed a course of Augmentin  on 12/11 Respiratory status is stable. Continue inhaler.  AKI Hemodynamically mediated Resolved  Essential hypertension predominantly diastolic hypertension.  stable on Coreg    Homelessness TOC following SNF planned  BMI: Estimated body mass index is 39.76 kg/m as calculated from the following:   Height as of this encounter: 6' 5 (1.956 m).   Weight as of this encounter: 152.1 kg.    Code status:   Code Status: Full Code   DVT Prophylaxis: SQ Lovenox    Family Communication: None at bedside  Disposition Plan: To be determined.  Guardianship being pursued.   Diet: Diet Order             Diet regular Room service appropriate? Yes with Assist; Fluid consistency: Thin  Diet effective now                     MEDICATIONS: Scheduled Meds:  carvedilol   3.125 mg Oral BID WC   cetaphil   Topical Daily   enoxaparin  (LOVENOX ) injection  70 mg Subcutaneous Q24H   feeding supplement  237 mL Oral BID BM   folic acid   1 mg Oral Daily   mometasone -formoterol   2 puff Inhalation BID   multivitamin with minerals  1 tablet Oral Daily   pantoprazole   40 mg Oral Daily   rosuvastatin   20 mg Oral Daily   senna-docusate  2 tablet Oral BID   thiamine   100 mg Oral Daily   Continuous Infusions:   PRN Meds:.acetaminophen  **OR** acetaminophen , LORazepam , naLOXone  (NARCAN )  injection, OLANZapine  **OR** OLANZapine , [DISCONTINUED] ondansetron  **OR** ondansetron  (ZOFRAN ) IV, mouth rinse   I have personally reviewed following labs and imaging studies  LABORATORY DATA:  Recent Labs  Lab 12/26/23 0506  WBC 4.9  HGB 12.6*  HCT 39.9  PLT 279  MCV 83.3  MCH 26.3  MCHC 31.6  RDW 14.9     Recent Labs  Lab 12/26/23 0506  NA 137   K 4.0  CL 102  CO2 25  ANIONGAP 10  GLUCOSE 175*  BUN 17  CREATININE 0.87  CALCIUM  9.2     Lab Results  Component Value Date   CHOL 127 10/27/2023   HDL 54 10/27/2023   LDLCALC 60 10/27/2023   TRIG 66 10/27/2023   CHOLHDL 2.4 10/27/2023      Recent Labs  Lab 12/26/23 0506  CALCIUM  9.2     MICROBIOLOGY: No results found for this or any previous visit (from the past 240 hours).   RADIOLOGY STUDIES/RESULTS: No results found.   LOS: 65 days   Signature  -    Donalda Applebaum M.D on 12/29/2023 at 10:08 AM   -  To page go to [email or web address]

## 2023-12-29 NOTE — Progress Notes (Signed)
   12/29/23 1038  Mobility  Activity Ambulated independently in hallway  Level of Assistance Standby assist, set-up cues, supervision of patient - no hands on  Assistive Device Other (Comment) (Handrails)  Distance Ambulated (ft) 1600 ft  Activity Response Tolerated fair  Mobility Referral Yes  Mobility visit 1 Mobility  Mobility Specialist Start Time (ACUTE ONLY) 1018  Mobility Specialist Stop Time (ACUTE ONLY) 1038  Mobility Specialist Time Calculation (min) (ACUTE ONLY) 20 min   Mobility Specialist: Progress Note  During Mobility: HR 90s, SpO2 97% RA  Pt agreeable to mobility session - received in chair. C/o back pain and abd discomfort. Requied x4 standing breaks and x2 seated breaks. Returned to chair with all needs met - call bell within reach.  Virgle Boards, BS Mobility Specialist Please contact via SecureChat or  Rehab office at [phone].

## 2023-12-29 NOTE — Plan of Care (Signed)
  Problem: Education: Goal: Knowledge of General Education information will improve Description: Including pain rating scale, medication(s)/side effects and non-pharmacologic comfort measures Outcome: Progressing   Problem: Clinical Measurements: Goal: Ability to maintain clinical measurements within normal limits will improve Outcome: Progressing Goal: Will remain free from infection Outcome: Progressing Goal: Diagnostic test results will improve Outcome: Progressing Goal: Cardiovascular complication will be avoided Outcome: Progressing   Problem: Activity: Goal: Risk for activity intolerance will decrease Outcome: Progressing   Problem: Nutrition: Goal: Adequate nutrition will be maintained Outcome: Progressing   Problem: Coping: Goal: Level of anxiety will decrease Outcome: Progressing   Problem: Elimination: Goal: Will not experience complications related to bowel motility Outcome: Progressing Goal: Will not experience complications related to urinary retention Outcome: Progressing   Problem: Pain Management: Goal: General experience of comfort will improve Outcome: Progressing   Problem: Safety: Goal: Ability to remain free from injury will improve Outcome: Progressing   Problem: Skin Integrity: Goal: Risk for impaired skin integrity will decrease Outcome: Progressing

## 2023-12-30 NOTE — Progress Notes (Signed)
 PROGRESS NOTE        PATIENT DETAILS Name: Colton Lee Age: 58 y.o. Sex: male Date of Birth: 10/28/1966 Admit Date: 10/24/2023 Admitting Physician Colton LITTIE Lever, NP PCP:Pcp, No  Brief Summary:  Patient is a 58 y.o.  male with history of suspected peripheral neuropathy-who presented to the ED on 12/2 with bilateral leg pain/tooth pain-found to have hypothermia and altered mental status.  Admitted to [facility]-underwent extensive workup including neuroimaging/CSF sampling/LTM EEG which were all unrevealing.  After obtaining additional collaborative history from a casual contact/friend-who confirmed that at baseline patient has disorganized thinking-it was felt that patient likely had schizophrenia.  Seen by psychiatry-trial of Risperdal  started-with no improvement-and in fact had side effects like bradykinesia/rigidity/masked facies.  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  He unfortunately is homeless-has no capacity-TOC following-APS now involved for guardianship-currently medically stable for discharge-once SNF bed available.   Significant events: 12/3>> hypothermic-somnolent-mild hypercarbia-started on BiPAP-admit to [facility] 12/4>> liberated on BiPAP.  Neuro consulted-MRI/LP recommended. MRI neg for CVA. Given Ativan  for attempted lumbar puncture-subsequently became more somnolent-transferred to ICU-intubated. 12/5>> LP by IR (CSF WBC 0, protein 96) 12/6>> extubated 12/7>> transfer back to [facility] 12/10>> Dr. 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   Significant studies: 12/2>> CXR: Bibasilar atelectasis/infiltrate 12/2>> CT head: No acute intracranial abnormality-left-sided sinusitis 12/3>> NH4: Within normal limit 12/3>> HIV: Negative 12/4>> vitamin B1: 106.1 (normal) 12/4>> vitamin  B12: 504 12/4>> RPR: Pending 12/4>> MRI brain: No acute CVA/intracranial abnormality. 12 4-12/5>> LTM EEG: Negative 12/6>> CTA head/neck: No LVO/hemodynamically significant stenosis-but motion artifact study. 12/6>> CT venogram head: No sinus thrombosis 12/8>> TSH: Normal limit 12/8>> a.m. cortisol: 10.3  Significant microbiology data: 12/2>> COVID/influenza/RSV PCR: Negative 12/2>> blood culture: Negative 12/3>> blood culture: Negative 12/3>> respiratory virus panel: Negative 12/3>> urine culture: Negative 12/4>> tracheal aspirate: Normal flora 12/4>> CSF culture: Negative 12/4>> CSF meningitis/encephalitis panel: Negative  Procedures: 12/4-12/6>> ETT 12/5>> LP under fluoroscopy  Consults: PCCM Neurology Psych   Subjective:  No complaints-sitting in chair-no major issues.  Objective:  Vitals:   12/29/23 0837 12/29/23 2032 12/29/23 2046 12/30/23 0834  BP:  (!) 152/103    Pulse:  97    Temp:  98.5 F (36.9 C)    Resp:  (!) 21    Height:      Weight: (!) 152.1 kg   (!) 152.6 kg  SpO2:  97% 99%   TempSrc:  Oral    BMI (Calculated):        Exam: Awake/alert Nonfocal exam.  Assessment/Plan: Korsakoff psychosis related dementia Extensive workup negative-see above.  Evaluated by psychiatry/neurology. Remains confused with disorganized thought process. At this time-thought to have Korsakoff psychosis at baseline (disorganized thought process-neurology able to get in touch with casual contact-Colton Lee-[phone] on 12/10)  TOC following for guardianship/SNF-patient cannot sign out AMA-he has no capacity Both neurology/psychiatry had signed off. Labs were stable on 12/25. Overall patient with no behavioral issues, he is on as needed Zyprexa  otherwise avoid scheduled first-generation agents and Risperdal  due to EPS in the past  Hypothermia Due to environment/cold exposure TSH/Cortisol stable Sepsis/infection ruled out-all cultures negative  Acute  hypercarbic/hypoxic respiratory failure  Aspiration pneumonia Secondary to somnolence/Ativan  use Intubated 12/4-12/6-currently stable on room air Completed a course of Augmentin   on 12/11 Respiratory status is stable. Continue inhaler.  AKI Hemodynamically mediated Resolved  Essential hypertension predominantly diastolic hypertension.  stable on Coreg    Homelessness TOC following SNF planned  BMI: Estimated body mass index is 39.89 kg/m as calculated from the following:   Height as of this encounter: 6' 5 (1.956 m).   Weight as of this encounter: 152.6 kg.    Code status:   Code Status: Full Code   DVT Prophylaxis: SQ Lovenox    Family Communication: None at bedside  Disposition Plan: To be determined.  Guardianship being pursued.   Diet: Diet Order             Diet regular Room service appropriate? Yes with Assist; Fluid consistency: Thin  Diet effective now                     MEDICATIONS: Scheduled Meds:  carvedilol   3.125 mg Oral BID WC   cetaphil   Topical Daily   enoxaparin  (LOVENOX ) injection  70 mg Subcutaneous Q24H   feeding supplement  237 mL Oral BID BM   folic acid   1 mg Oral Daily   mometasone -formoterol   2 puff Inhalation BID   multivitamin with minerals  1 tablet Oral Daily   pantoprazole   40 mg Oral Daily   rosuvastatin   20 mg Oral Daily   senna-docusate  2 tablet Oral BID   thiamine   100 mg Oral Daily   Continuous Infusions:   PRN Meds:.acetaminophen  **OR** acetaminophen , LORazepam , naLOXone  (NARCAN )  injection, OLANZapine  **OR** OLANZapine , [DISCONTINUED] ondansetron  **OR** ondansetron  (ZOFRAN ) IV, mouth rinse   I have personally reviewed following labs and imaging studies  LABORATORY DATA:  Recent Labs  Lab 12/26/23 0506  WBC 4.9  HGB 12.6*  HCT 39.9  PLT 279  MCV 83.3  MCH 26.3  MCHC 31.6  RDW 14.9     Recent Labs  Lab 12/26/23 0506  NA 137  K 4.0  CL 102  CO2 25  ANIONGAP 10  GLUCOSE 175*  BUN 17   CREATININE 0.87  CALCIUM  9.2     Lab Results  Component Value Date   CHOL 127 10/27/2023   HDL 54 10/27/2023   LDLCALC 60 10/27/2023   TRIG 66 10/27/2023   CHOLHDL 2.4 10/27/2023      Recent Labs  Lab 12/26/23 0506  CALCIUM  9.2     MICROBIOLOGY: No results found for this or any previous visit (from the past 240 hours).   RADIOLOGY STUDIES/RESULTS: No results found.   LOS: 66 days   Signature  -    Donalda Applebaum M.D on 12/30/2023 at 10:45 AM   -  To page go to [email or web address]

## 2023-12-30 NOTE — Plan of Care (Signed)
  Problem: Education: Goal: Knowledge of General Education information will improve Description: Including pain rating scale, medication(s)/side effects and non-pharmacologic comfort measures Outcome: Progressing   Problem: Health Behavior/Discharge Planning: Goal: Ability to manage health-related needs will improve Outcome: Progressing   Problem: Clinical Measurements: Goal: Ability to maintain clinical measurements within normal limits will improve Outcome: Progressing Goal: Will remain free from infection Outcome: Progressing   Problem: Activity: Goal: Risk for activity intolerance will decrease Outcome: Progressing   Problem: Coping: Goal: Level of anxiety will decrease Outcome: Progressing   

## 2023-12-30 NOTE — TOC Progression Note (Signed)
 Transition of Care [facility]) - Progression Note    Patient Details  Name: Colton Lee MRN: [id number] Date of Birth: 10/28/1966  [facility]) CM/SW Contact  Inocente GORMAN Kindle, LCSW Phone Number: 12/30/2023, 3:22 PM  Clinical Narrative:    CSW continuing to follow.    Expected Discharge Plan: Skilled Nursing Facility Barriers to Discharge: Homeless with medical needs, Inadequate or no insurance  Expected Discharge Plan and Services In-house Referral: Clinical Social Work     Living arrangements for the past 2 months: Homeless                                       Social Determinants of Health (SDOH) Interventions SDOH Screenings   Food Insecurity: Patient Unable To Answer (10/25/2023)  Housing: Patient Unable To Answer (10/25/2023)  Transportation Needs: Patient Unable To Answer (10/25/2023)  Utilities: Patient Unable To Answer (10/25/2023)  Tobacco Use: High Risk (10/27/2023)    Readmission Risk Interventions     No data to display

## 2023-12-31 LAB — TSH: TSH: 0.882 u[IU]/mL (ref 0.350–4.500)

## 2023-12-31 MED ORDER — MAGIC MOUTHWASH W/LIDOCAINE
10.0000 mL | Freq: Four times a day (QID) | ORAL | Status: DC | PRN
  Administered 2023-12-31 – 2024-01-15 (×10): 10 mL via ORAL
  Filled 2023-12-31 (×15): qty 10

## 2023-12-31 NOTE — Progress Notes (Signed)
 Mobility Specialist: Progress Note   12/31/23 1258  Mobility  Activity Ambulated with assistance in hallway  Level of Assistance Standby assist, set-up cues, supervision of patient - no hands on  Assistive Device None  Distance Ambulated (ft) 800 ft  Activity Response Tolerated well  Mobility Referral Yes  Mobility visit 1 Mobility  Mobility Specialist Start Time (ACUTE ONLY) 0920  Mobility Specialist Stop Time (ACUTE ONLY) 0930  Mobility Specialist Time Calculation (min) (ACUTE ONLY) 10 min    Received pt in chair having no complaints and agreeable to mobility. Pt was asymptomatic throughout ambulation and returned to room w/o fault. Left in chair w/ call bell in reach and all needs met.   Ileana Lute Mobility Specialist Please contact via SecureChat or Rehab office at [phone]

## 2023-12-31 NOTE — Plan of Care (Signed)

## 2023-12-31 NOTE — Progress Notes (Signed)
 PROGRESS NOTE        PATIENT DETAILS Name: Colton Lee Age: 58 y.o. Sex: male Date of Birth: 10/28/1966 Admit Date: 10/24/2023 Admitting Physician Isaiah LITTIE Lever, NP PCP:Pcp, No  Brief Summary:  Patient is a 58 y.o.  male with history of suspected peripheral neuropathy-who presented to the ED on 12/2 with bilateral leg pain/tooth pain-found to have hypothermia and altered mental status.  Admitted to [facility]-underwent extensive workup including neuroimaging/CSF sampling/LTM EEG which were all unrevealing.  After obtaining additional collaborative history from a casual contact/friend-who confirmed that at baseline patient has disorganized thinking-it was felt that patient likely had schizophrenia.  Seen by psychiatry-trial of Risperdal  started-with no improvement-and in fact had side effects like bradykinesia/rigidity/masked facies.  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  He unfortunately is homeless-has no capacity-TOC following-APS now involved for guardianship-currently medically stable for discharge-once SNF bed available.   Significant events: 12/3>> hypothermic-somnolent-mild hypercarbia-started on BiPAP-admit to [facility] 12/4>> liberated on BiPAP.  Neuro consulted-MRI/LP recommended. MRI neg for CVA. Given Ativan  for attempted lumbar puncture-subsequently became more somnolent-transferred to ICU-intubated. 12/5>> LP by IR (CSF WBC 0, protein 96) 12/6>> extubated 12/7>> transfer back to [facility] 12/10>> Dr. 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   Significant studies: 12/2>> CXR: Bibasilar atelectasis/infiltrate 12/2>> CT head: No acute intracranial abnormality-left-sided sinusitis 12/3>> NH4: Within normal limit 12/3>> HIV: Negative 12/4>> vitamin B1: 106.1 (normal) 12/4>> vitamin  B12: 504 12/4>> RPR: Pending 12/4>> MRI brain: No acute CVA/intracranial abnormality. 12 4-12/5>> LTM EEG: Negative 12/6>> CTA head/neck: No LVO/hemodynamically significant stenosis-but motion artifact study. 12/6>> CT venogram head: No sinus thrombosis 12/8>> TSH: Normal limit 12/8>> a.m. cortisol: 10.3  Significant microbiology data: 12/2>> COVID/influenza/RSV PCR: Negative 12/2>> blood culture: Negative 12/3>> blood culture: Negative 12/3>> respiratory virus panel: Negative 12/3>> urine culture: Negative 12/4>> tracheal aspirate: Normal flora 12/4>> CSF culture: Negative 12/4>> CSF meningitis/encephalitis panel: Negative  Procedures: 12/4-12/6>> ETT 12/5>> LP under fluoroscopy  Consults: PCCM Neurology Psych   Subjective:  No complaints-remains pleasantly confused  Objective:  Vitals:   12/30/23 2000 12/30/23 2107 12/31/23 0242 12/31/23 0800  BP:  (!) 153/76  (!) 141/105  Pulse:    80  Temp:  97.9 F (36.6 C)  98 F (36.7 C)  Resp:  18  18  Height:      Weight:   (!) 152.6 kg   SpO2: 98%     TempSrc:  Oral  Oral  BMI (Calculated):        Exam: Awake/alert Nonfocal exam.  Assessment/Plan: Korsakoff psychosis related dementia Extensive workup negative-see above.  Evaluated by psychiatry/neurology. Remains confused with disorganized thought process. At this time-thought to have Korsakoff psychosis at baseline (disorganized thought process-neurology able to get in touch with casual contact-[name]-[phone] on 12/10)  TOC following for guardianship/SNF-patient cannot sign out AMA-he has no capacity Both neurology/psychiatry had signed off. Labs were stable on 12/25. Overall patient with no behavioral issues, he is on as needed Zyprexa  otherwise avoid scheduled first-generation agents and Risperdal  due to EPS in the past  Hypothermia Due to environment/cold exposure TSH/Cortisol stable Sepsis/infection ruled out-all cultures negative  Acute  hypercarbic/hypoxic respiratory failure  Aspiration pneumonia Secondary to somnolence/Ativan  use Intubated 12/4-12/6-currently stable on room air Completed a course of Augmentin  on  12/11 Respiratory status is stable. Continue inhaler.  AKI Hemodynamically mediated Resolved  Essential hypertension predominantly diastolic hypertension.  stable on Coreg    Homelessness TOC following SNF planned  BMI: Estimated body mass index is 39.89 kg/m as calculated from the following:   Height as of this encounter: 6' 5 (1.956 m).   Weight as of this encounter: 152.6 kg.    Code status:   Code Status: Full Code   DVT Prophylaxis: SQ Lovenox    Family Communication: None at bedside  Disposition Plan: To be determined.  Guardianship being pursued.   Diet: Diet Order             Diet regular Room service appropriate? Yes with Assist; Fluid consistency: Thin  Diet effective now                     MEDICATIONS: Scheduled Meds:  carvedilol   3.125 mg Oral BID WC   cetaphil   Topical Daily   enoxaparin  (LOVENOX ) injection  70 mg Subcutaneous Q24H   feeding supplement  237 mL Oral BID BM   folic acid   1 mg Oral Daily   mometasone -formoterol   2 puff Inhalation BID   multivitamin with minerals  1 tablet Oral Daily   pantoprazole   40 mg Oral Daily   rosuvastatin   20 mg Oral Daily   senna-docusate  2 tablet Oral BID   thiamine   100 mg Oral Daily   Continuous Infusions:   PRN Meds:.acetaminophen  **OR** acetaminophen , LORazepam , naLOXone  (NARCAN )  injection, OLANZapine  **OR** OLANZapine , [DISCONTINUED] ondansetron  **OR** ondansetron  (ZOFRAN ) IV, mouth rinse   I have personally reviewed following labs and imaging studies  LABORATORY DATA:  Recent Labs  Lab 12/26/23 0506  WBC 4.9  HGB 12.6*  HCT 39.9  PLT 279  MCV 83.3  MCH 26.3  MCHC 31.6  RDW 14.9     Recent Labs  Lab 12/26/23 0506  NA 137  K 4.0  CL 102  CO2 25  ANIONGAP 10  GLUCOSE 175*  BUN 17   CREATININE 0.87  CALCIUM  9.2     Lab Results  Component Value Date   CHOL 127 10/27/2023   HDL 54 10/27/2023   LDLCALC 60 10/27/2023   TRIG 66 10/27/2023   CHOLHDL 2.4 10/27/2023      Recent Labs  Lab 12/26/23 0506  CALCIUM  9.2     MICROBIOLOGY: No results found for this or any previous visit (from the past 240 hours).   RADIOLOGY STUDIES/RESULTS: No results found.   LOS: 67 days   Signature  -    Donalda Applebaum M.D on 12/31/2023 at 11:55 AM   -  To page go to [email or web address]

## 2024-01-01 NOTE — Progress Notes (Signed)
 PROGRESS NOTE        PATIENT DETAILS Name: Colton Lee Age: 58 y.o. Sex: male Date of Birth: 10/28/1966 Admit Date: 10/24/2023 Admitting Physician Isaiah LITTIE Lever, NP PCP:Pcp, No  Brief Summary:  Patient is a 58 y.o.  male with history of suspected peripheral neuropathy-who presented to the ED on 12/2 with bilateral leg pain/tooth pain-found to have hypothermia and altered mental status.  Admitted to [facility]-underwent extensive workup including neuroimaging/CSF sampling/LTM EEG which were all unrevealing.  After obtaining additional collaborative history from a casual contact/friend-who confirmed that at baseline patient has disorganized thinking-it was felt that patient likely had schizophrenia.  Seen by psychiatry-trial of Risperdal  started-with no improvement-and in fact had side effects like bradykinesia/rigidity/masked facies.  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  He unfortunately is homeless-has no capacity-TOC following-APS now involved for guardianship-currently medically stable for discharge-once SNF bed available.   Significant events: 12/3>> hypothermic-somnolent-mild hypercarbia-started on BiPAP-admit to [facility] 12/4>> liberated on BiPAP.  Neuro consulted-MRI/LP recommended. MRI neg for CVA. Given Ativan  for attempted lumbar puncture-subsequently became more somnolent-transferred to ICU-intubated. 12/5>> LP by IR (CSF WBC 0, protein 96) 12/6>> extubated 12/7>> transfer back to [facility] 12/10>> Dr. 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   Significant studies: 12/2>> CXR: Bibasilar atelectasis/infiltrate 12/2>> CT head: No acute intracranial abnormality-left-sided sinusitis 12/3>> NH4: Within normal limit 12/3>> HIV: Negative 12/4>> vitamin B1: 106.1 (normal) 12/4>> vitamin  B12: 504 12/4>> RPR: Pending 12/4>> MRI brain: No acute CVA/intracranial abnormality. 12 4-12/5>> LTM EEG: Negative 12/6>> CTA head/neck: No LVO/hemodynamically significant stenosis-but motion artifact study. 12/6>> CT venogram head: No sinus thrombosis 12/8>> TSH: Normal limit 12/8>> a.m. cortisol: 10.3  Significant microbiology data: 12/2>> COVID/influenza/RSV PCR: Negative 12/2>> blood culture: Negative 12/3>> blood culture: Negative 12/3>> respiratory virus panel: Negative 12/3>> urine culture: Negative 12/4>> tracheal aspirate: Normal flora 12/4>> CSF culture: Negative 12/4>> CSF meningitis/encephalitis panel: Negative  Procedures: 12/4-12/6>> ETT 12/5>> LP under fluoroscopy  Consults: PCCM Neurology Psych   Subjective:  Patient in bed, appears comfortable, denies any headache, no fever, no chest pain or pressure, no shortness of breath , no abdominal pain. No focal weakness.  Objective:  Vitals:   12/31/23 2018 01/01/24 0426 01/01/24 0535 01/01/24 0900  BP:  (!) 142/94  130/74  Pulse:  64    Temp:  97.7 F (36.5 C)    Resp:    20  Height:      Weight:   (!) 152 kg   SpO2: 96%     TempSrc:  Oral    BMI (Calculated):        Exam: Awake/alert Nonfocal exam.  Assessment/Plan: Korsakoff psychosis related dementia Extensive workup negative-see above.  Evaluated by psychiatry/neurology. Remains confused with disorganized thought process. At this time-thought to have Korsakoff psychosis at baseline (disorganized thought process-neurology able to get in touch with casual contact-[name]-[phone] on 12/10)  TOC following for guardianship/SNF-patient cannot sign out AMA-he has no capacity Both neurology/psychiatry had signed off. Labs were stable on 12/25. Overall patient with no behavioral issues, he is on as needed Zyprexa  otherwise avoid scheduled first-generation agents and Risperdal  due to EPS in the past  Hypothermia Due to environment/cold  exposure, resolved TSH/Cortisol stable Sepsis/infection ruled out-all cultures negative  Acute hypercarbic/hypoxic respiratory failure  Aspiration pneumonia Secondary to somnolence/Ativan  use Intubated 12/4-12/6-currently stable on room air Completed a course of Augmentin  on 12/11 Respiratory status is stable. Continue inhaler.  AKI Hemodynamically mediated Resolved  Essential hypertension predominantly diastolic hypertension.  stable on Coreg    Homelessness TOC following SNF planned  BMI: Estimated body mass index is 39.74 kg/m as calculated from the following:   Height as of this encounter: 6' 5 (1.956 m).   Weight as of this encounter: 152 kg.    Code status:   Code Status: Full Code   DVT Prophylaxis: SQ Lovenox    Family Communication: None at bedside  Disposition Plan: To be determined.  Guardianship being pursued.   Diet: Diet Order             Diet regular Room service appropriate? Yes with Assist; Fluid consistency: Thin  Diet effective now                     MEDICATIONS: Scheduled Meds:  carvedilol   3.125 mg Oral BID WC   cetaphil   Topical Daily   enoxaparin  (LOVENOX ) injection  70 mg Subcutaneous Q24H   feeding supplement  237 mL Oral BID BM   folic acid   1 mg Oral Daily   mometasone -formoterol   2 puff Inhalation BID   multivitamin with minerals  1 tablet Oral Daily   pantoprazole   40 mg Oral Daily   rosuvastatin   20 mg Oral Daily   senna-docusate  2 tablet Oral BID   thiamine   100 mg Oral Daily   Continuous Infusions:   PRN Meds:.acetaminophen  **OR** acetaminophen , LORazepam , magic mouthwash w/lidocaine , naLOXone  (NARCAN )  injection, OLANZapine  **OR** OLANZapine , [DISCONTINUED] ondansetron  **OR** ondansetron  (ZOFRAN ) IV, mouth rinse   I have personally reviewed following labs and imaging studies  LABORATORY DATA:  Recent Labs  Lab 12/26/23 0506  WBC 4.9  HGB 12.6*  HCT 39.9  PLT 279  MCV 83.3  MCH 26.3  MCHC 31.6   RDW 14.9     Recent Labs  Lab 12/26/23 0506 12/31/23 1738  NA 137  --   K 4.0  --   CL 102  --   CO2 25  --   ANIONGAP 10  --   GLUCOSE 175*  --   BUN 17  --   CREATININE 0.87  --   TSH  --  0.882  CALCIUM  9.2  --      Lab Results  Component Value Date   CHOL 127 10/27/2023   HDL 54 10/27/2023   LDLCALC 60 10/27/2023   TRIG 66 10/27/2023   CHOLHDL 2.4 10/27/2023      Recent Labs  Lab 12/26/23 0506 12/31/23 1738  TSH  --  0.882  CALCIUM  9.2  --      MICROBIOLOGY: No results found for this or any previous visit (from the past 240 hours).   RADIOLOGY STUDIES/RESULTS: No results found.   LOS: 68 days   Signature  -    Lavada Stank M.D on 01/01/2024 at 11:25 AM   -  To page go to [email or web address]

## 2024-01-02 LAB — MAGNESIUM: Magnesium: 2 mg/dL (ref 1.7–2.4)

## 2024-01-02 LAB — CBC WITH DIFFERENTIAL/PLATELET
Abs Immature Granulocytes: 0.02 K/uL (ref 0.00–0.07)
Basophils Absolute: 0 K/uL (ref 0.0–0.1)
Basophils Relative: 1 %
Eosinophils Absolute: 0.1 K/uL (ref 0.0–0.5)
Eosinophils Relative: 2 %
HCT: 38.9 % — ABNORMAL LOW (ref 39.0–52.0)
Hemoglobin: 12.3 g/dL — ABNORMAL LOW (ref 13.0–17.0)
Immature Granulocytes: 0 %
Lymphocytes Relative: 40 %
Lymphs Abs: 2.1 K/uL (ref 0.7–4.0)
MCH: 26.3 pg (ref 26.0–34.0)
MCHC: 31.6 g/dL (ref 30.0–36.0)
MCV: 83.1 fL (ref 80.0–100.0)
Monocytes Absolute: 0.6 K/uL (ref 0.1–1.0)
Monocytes Relative: 10 %
Neutro Abs: 2.6 K/uL (ref 1.7–7.7)
Neutrophils Relative %: 47 %
Platelets: 263 K/uL (ref 150–400)
RBC: 4.68 MIL/uL (ref 4.22–5.81)
RDW: 15 % (ref 11.5–15.5)
WBC: 5.4 K/uL (ref 4.0–10.5)
nRBC: 0 % (ref 0.0–0.2)

## 2024-01-02 LAB — BASIC METABOLIC PANEL WITH GFR
Anion gap: 9 (ref 5–15)
BUN: 18 mg/dL (ref 6–20)
CO2: 28 mmol/L (ref 22–32)
Calcium: 9.3 mg/dL (ref 8.9–10.3)
Chloride: 102 mmol/L (ref 98–111)
Creatinine, Ser: 1.06 mg/dL (ref 0.61–1.24)
GFR, Estimated: >60 mL/min (ref >60)
Glucose, Bld: 107 mg/dL — ABNORMAL HIGH (ref 70–99)
Potassium: 3.7 mmol/L (ref 3.5–5.1)
Sodium: 139 mmol/L (ref 135–145)

## 2024-01-02 LAB — PHOSPHORUS: Phosphorus: 4.4 mg/dL (ref 2.5–4.6)

## 2024-01-02 MED ORDER — CLONAZEPAM 0.5 MG PO TBDP
0.5000 mg | ORAL_TABLET | Freq: Two times a day (BID) | ORAL | Status: DC
  Administered 2024-01-02 – 2024-06-05 (×310): 0.5 mg via ORAL
  Filled 2024-01-02 (×310): qty 1

## 2024-01-02 MED ORDER — LORAZEPAM 2 MG/ML IJ SOLN
1.0000 mg | Freq: Two times a day (BID) | INTRAMUSCULAR | Status: DC | PRN
  Administered 2024-01-31: 1 mg via INTRAVENOUS
  Filled 2024-01-02: qty 1

## 2024-01-04 ENCOUNTER — Other Ambulatory Visit (HOSPITAL_COMMUNITY): Payer: Self-pay

## 2024-01-07 MED ORDER — CARVEDILOL 6.25 MG PO TABS
6.2500 mg | ORAL_TABLET | Freq: Two times a day (BID) | ORAL | Status: DC
  Administered 2024-01-07 – 2024-01-14 (×14): 6.25 mg via ORAL
  Filled 2024-01-07 (×14): qty 1

## 2024-01-07 MED ORDER — CARVEDILOL 3.125 MG PO TABS
3.1250 mg | ORAL_TABLET | Freq: Once | ORAL | Status: AC
  Administered 2024-01-07: 3.125 mg via ORAL
  Filled 2024-01-07: qty 1

## 2024-01-09 LAB — BASIC METABOLIC PANEL WITH GFR
Anion gap: 11 (ref 5–15)
BUN: 27 mg/dL — ABNORMAL HIGH (ref 6–20)
CO2: 26 mmol/L (ref 22–32)
Calcium: 9.4 mg/dL (ref 8.9–10.3)
Chloride: 101 mmol/L (ref 98–111)
Creatinine, Ser: 1.2 mg/dL (ref 0.61–1.24)
GFR, Estimated: >60 mL/min (ref >60)
Glucose, Bld: 197 mg/dL — ABNORMAL HIGH (ref 70–99)
Potassium: 4 mmol/L (ref 3.5–5.1)
Sodium: 138 mmol/L (ref 135–145)

## 2024-01-09 LAB — CBC WITH DIFFERENTIAL/PLATELET
Abs Immature Granulocytes: 0.03 K/uL (ref 0.00–0.07)
Basophils Absolute: 0 K/uL (ref 0.0–0.1)
Basophils Relative: 1 %
Eosinophils Absolute: 0.1 K/uL (ref 0.0–0.5)
Eosinophils Relative: 2 %
HCT: 37.2 % — ABNORMAL LOW (ref 39.0–52.0)
Hemoglobin: 11.9 g/dL — ABNORMAL LOW (ref 13.0–17.0)
Immature Granulocytes: 1 %
Lymphocytes Relative: 37 %
Lymphs Abs: 1.9 K/uL (ref 0.7–4.0)
MCH: 26.3 pg (ref 26.0–34.0)
MCHC: 32 g/dL (ref 30.0–36.0)
MCV: 82.1 fL (ref 80.0–100.0)
Monocytes Absolute: 0.5 K/uL (ref 0.1–1.0)
Monocytes Relative: 11 %
Neutro Abs: 2.5 K/uL (ref 1.7–7.7)
Neutrophils Relative %: 48 %
Platelets: 307 K/uL (ref 150–400)
RBC: 4.53 MIL/uL (ref 4.22–5.81)
RDW: 15.4 % (ref 11.5–15.5)
WBC: 5.1 K/uL (ref 4.0–10.5)
nRBC: 0.4 % — ABNORMAL HIGH (ref 0.0–0.2)

## 2024-01-09 LAB — HEMOGLOBIN A1C
Hgb A1c MFr Bld: 6.9 % — ABNORMAL HIGH (ref 4.8–5.6)
Mean Plasma Glucose: 151.33 mg/dL

## 2024-01-09 LAB — MAGNESIUM: Magnesium: 2 mg/dL (ref 1.7–2.4)

## 2024-01-09 LAB — PHOSPHORUS: Phosphorus: 4.7 mg/dL — ABNORMAL HIGH (ref 2.5–4.6)

## 2024-01-10 LAB — GLUCOSE, CAPILLARY
Glucose-Capillary: 157 mg/dL — ABNORMAL HIGH (ref 70–99)
Glucose-Capillary: 185 mg/dL — ABNORMAL HIGH (ref 70–99)
Glucose-Capillary: 179 mg/dL — ABNORMAL HIGH (ref 70–99)
Glucose-Capillary: 167 mg/dL — ABNORMAL HIGH (ref 70–99)

## 2024-01-10 MED ORDER — INSULIN GLARGINE-YFGN 100 UNIT/ML ~~LOC~~ SOLN
8.0000 [IU] | Freq: Every day | SUBCUTANEOUS | Status: DC
Start: 2024-01-10 — End: 2024-01-12
  Administered 2024-01-10 – 2024-01-12 (×3): 8 [IU] via SUBCUTANEOUS
  Filled 2024-01-10 (×3): qty 0.08

## 2024-01-10 MED ORDER — INSULIN ASPART 100 UNIT/ML IJ SOLN
0.0000 [IU] | Freq: Three times a day (TID) | INTRAMUSCULAR | Status: DC
  Administered 2024-01-10 – 2024-01-11 (×4): 2 [IU] via SUBCUTANEOUS
  Administered 2024-01-11 – 2024-01-12 (×3): 1 [IU] via SUBCUTANEOUS
  Administered 2024-01-12: 2 [IU] via SUBCUTANEOUS
  Administered 2024-01-15: 1 [IU] via SUBCUTANEOUS
  Administered 2024-01-16: 2 [IU] via SUBCUTANEOUS

## 2024-01-10 MED ORDER — ENOXAPARIN SODIUM 80 MG/0.8ML IJ SOSY
80.0000 mg | PREFILLED_SYRINGE | INTRAMUSCULAR | Status: DC
  Administered 2024-01-10 – 2024-03-27 (×66): 80 mg via SUBCUTANEOUS
  Filled 2024-01-10 (×74): qty 0.8

## 2024-01-10 MED ORDER — INSULIN ASPART 100 UNIT/ML IJ SOLN
0.0000 [IU] | Freq: Every day | INTRAMUSCULAR | Status: DC
  Administered 2024-03-25 – 2024-04-08 (×4): 2 [IU] via SUBCUTANEOUS

## 2024-01-11 DIAGNOSIS — E119 Type 2 diabetes mellitus without complications: Secondary | ICD-10-CM

## 2024-01-11 LAB — GLUCOSE, CAPILLARY
Glucose-Capillary: 177 mg/dL — ABNORMAL HIGH (ref 70–99)
Glucose-Capillary: 126 mg/dL — ABNORMAL HIGH (ref 70–99)
Glucose-Capillary: 143 mg/dL — ABNORMAL HIGH (ref 70–99)

## 2024-01-11 MED ORDER — METFORMIN HCL 500 MG PO TABS
500.0000 mg | ORAL_TABLET | Freq: Two times a day (BID) | ORAL | Status: DC
  Administered 2024-01-11 – 2024-04-08 (×167): 500 mg via ORAL
  Filled 2024-01-11 (×171): qty 1

## 2024-01-12 LAB — GLUCOSE, CAPILLARY
Glucose-Capillary: 169 mg/dL — ABNORMAL HIGH (ref 70–99)
Glucose-Capillary: 141 mg/dL — ABNORMAL HIGH (ref 70–99)
Glucose-Capillary: 107 mg/dL — ABNORMAL HIGH (ref 70–99)
Glucose-Capillary: 124 mg/dL — ABNORMAL HIGH (ref 70–99)

## 2024-01-12 MED ORDER — LOSARTAN POTASSIUM 50 MG PO TABS
25.0000 mg | ORAL_TABLET | Freq: Every day | ORAL | Status: DC
  Administered 2024-01-12 – 2024-04-22 (×101): 25 mg via ORAL
  Filled 2024-01-12 (×102): qty 1

## 2024-01-12 MED ORDER — INSULIN GLARGINE-YFGN 100 UNIT/ML ~~LOC~~ SOLN
5.0000 [IU] | Freq: Every day | SUBCUTANEOUS | Status: DC
  Filled 2024-01-12: qty 0.05

## 2024-01-13 LAB — GLUCOSE, CAPILLARY
Glucose-Capillary: 105 mg/dL — ABNORMAL HIGH (ref 70–99)
Glucose-Capillary: 148 mg/dL — ABNORMAL HIGH (ref 70–99)
Glucose-Capillary: 115 mg/dL — ABNORMAL HIGH (ref 70–99)
Glucose-Capillary: 113 mg/dL — ABNORMAL HIGH (ref 70–99)

## 2024-01-13 MED ORDER — INSULIN GLARGINE-YFGN 100 UNIT/ML ~~LOC~~ SOLN
4.0000 [IU] | Freq: Every day | SUBCUTANEOUS | Status: DC
  Administered 2024-01-13: 4 [IU] via SUBCUTANEOUS
  Filled 2024-01-13 (×3): qty 0.04

## 2024-01-14 DIAGNOSIS — I10 Essential (primary) hypertension: Secondary | ICD-10-CM

## 2024-01-14 LAB — GLUCOSE, CAPILLARY
Glucose-Capillary: 152 mg/dL — ABNORMAL HIGH (ref 70–99)
Glucose-Capillary: 94 mg/dL (ref 70–99)
Glucose-Capillary: 119 mg/dL — ABNORMAL HIGH (ref 70–99)

## 2024-01-15 LAB — GLUCOSE, CAPILLARY
Glucose-Capillary: 122 mg/dL — ABNORMAL HIGH (ref 70–99)
Glucose-Capillary: 112 mg/dL — ABNORMAL HIGH (ref 70–99)
Glucose-Capillary: 107 mg/dL — ABNORMAL HIGH (ref 70–99)
Glucose-Capillary: 116 mg/dL — ABNORMAL HIGH (ref 70–99)

## 2024-01-15 LAB — BASIC METABOLIC PANEL WITH GFR
Anion gap: 13 (ref 5–15)
BUN: 23 mg/dL — ABNORMAL HIGH (ref 6–20)
CO2: 26 mmol/L (ref 22–32)
Calcium: 9.6 mg/dL (ref 8.9–10.3)
Chloride: 99 mmol/L (ref 98–111)
Creatinine, Ser: 0.96 mg/dL (ref 0.61–1.24)
GFR, Estimated: >60 mL/min (ref >60)
Glucose, Bld: 113 mg/dL — ABNORMAL HIGH (ref 70–99)
Potassium: 4 mmol/L (ref 3.5–5.1)
Sodium: 138 mmol/L (ref 135–145)

## 2024-01-15 LAB — CBC
HCT: 38.5 % — ABNORMAL LOW (ref 39.0–52.0)
Hemoglobin: 11.9 g/dL — ABNORMAL LOW (ref 13.0–17.0)
MCH: 25.9 pg — ABNORMAL LOW (ref 26.0–34.0)
MCHC: 30.9 g/dL (ref 30.0–36.0)
MCV: 83.9 fL (ref 80.0–100.0)
Platelets: 289 K/uL (ref 150–400)
RBC: 4.59 MIL/uL (ref 4.22–5.81)
RDW: 15.1 % (ref 11.5–15.5)
WBC: 3.9 K/uL — ABNORMAL LOW (ref 4.0–10.5)
nRBC: 0 % (ref 0.0–0.2)

## 2024-01-16 LAB — GLUCOSE, CAPILLARY: Glucose-Capillary: 157 mg/dL — ABNORMAL HIGH (ref 70–99)

## 2024-01-17 LAB — GLUCOSE, CAPILLARY
Glucose-Capillary: 148 mg/dL — ABNORMAL HIGH (ref 70–99)
Glucose-Capillary: 111 mg/dL — ABNORMAL HIGH (ref 70–99)
Glucose-Capillary: 96 mg/dL (ref 70–99)

## 2024-01-18 LAB — GLUCOSE, CAPILLARY
Glucose-Capillary: 138 mg/dL — ABNORMAL HIGH (ref 70–99)
Glucose-Capillary: 94 mg/dL (ref 70–99)
Glucose-Capillary: 132 mg/dL — ABNORMAL HIGH (ref 70–99)
Glucose-Capillary: 111 mg/dL — ABNORMAL HIGH (ref 70–99)

## 2024-01-19 LAB — GLUCOSE, CAPILLARY
Glucose-Capillary: 109 mg/dL — ABNORMAL HIGH (ref 70–99)
Glucose-Capillary: 113 mg/dL — ABNORMAL HIGH (ref 70–99)
Glucose-Capillary: 105 mg/dL — ABNORMAL HIGH (ref 70–99)

## 2024-01-19 NOTE — TOC Progression Note (Addendum)
 Transition of Care [facility]) - Progression Note    Patient Details  Name: Colton Lee MRN: [id number] Date of Birth: 1966/01/14  [facility]) CM/SW Contact  Mearl Latin, LCSW Phone Number: 01/19/2024, 2:07 PM  Clinical Narrative:    CSW spoke with patient's APS worker, Ms. Dalene Carrow [phone]). She confirmed she was able to speak with the [facility] and they are emailing consents for Disability application to her.    CSW received call from [place] with the [facility] stating he will be coming on Monday at 2pm to see patient to complete the final piece of the Disability referral.    Expected Discharge Plan: Skilled Nursing Facility Barriers to Discharge: Homeless with medical needs, Inadequate or no insurance  Expected Discharge Plan and Services In-house Referral: Clinical Social Work     Living arrangements for the past 2 months: Homeless                                       Social Determinants of Health (SDOH) Interventions SDOH Screenings   Food Insecurity: Patient Unable To Answer (10/25/2023)  Housing: Patient Unable To Answer (10/25/2023)  Transportation Needs: Patient Unable To Answer (10/25/2023)  Utilities: Patient Unable To Answer (10/25/2023)  Tobacco Use: High Risk (10/27/2023)    Readmission Risk Interventions     No data to display

## 2024-01-20 LAB — GLUCOSE, CAPILLARY: Glucose-Capillary: 99 mg/dL (ref 70–99)

## 2024-01-21 LAB — GLUCOSE, CAPILLARY: Glucose-Capillary: 115 mg/dL — ABNORMAL HIGH (ref 70–99)

## 2024-01-22 LAB — GLUCOSE, CAPILLARY: Glucose-Capillary: 114 mg/dL — ABNORMAL HIGH (ref 70–99)

## 2024-01-24 LAB — GLUCOSE, CAPILLARY: Glucose-Capillary: 135 mg/dL — ABNORMAL HIGH (ref 70–99)

## 2024-01-25 LAB — GLUCOSE, CAPILLARY: Glucose-Capillary: 120 mg/dL — ABNORMAL HIGH (ref 70–99)

## 2024-01-26 LAB — GLUCOSE, CAPILLARY: Glucose-Capillary: 104 mg/dL — ABNORMAL HIGH (ref 70–99)

## 2024-01-27 LAB — GLUCOSE, CAPILLARY
Glucose-Capillary: 129 mg/dL — ABNORMAL HIGH (ref 70–99)
Glucose-Capillary: 112 mg/dL — ABNORMAL HIGH (ref 70–99)

## 2024-01-27 MED ORDER — MENTHOL 3 MG MT LOZG
1.0000 | LOZENGE | OROMUCOSAL | Status: DC | PRN
  Administered 2024-01-27 – 2024-05-29 (×53): 3 mg via ORAL
  Filled 2024-01-27 (×73): qty 9

## 2024-01-27 MED ADMIN — Emollient - Lotion: 1 | TOPICAL | NDC 00299024133

## 2024-01-28 ENCOUNTER — Inpatient Hospital Stay (HOSPITAL_COMMUNITY): Payer: Self-pay

## 2024-01-28 LAB — BASIC METABOLIC PANEL WITH GFR
Anion gap: 14 (ref 5–15)
BUN: 17 mg/dL (ref 6–20)
CO2: 22 mmol/L (ref 22–32)
Calcium: 8.9 mg/dL (ref 8.9–10.3)
Chloride: 98 mmol/L (ref 98–111)
Creatinine, Ser: 1.02 mg/dL (ref 0.61–1.24)
GFR, Estimated: >60 mL/min (ref >60)
Glucose, Bld: 101 mg/dL — ABNORMAL HIGH (ref 70–99)
Potassium: 4.2 mmol/L (ref 3.5–5.1)
Sodium: 134 mmol/L — ABNORMAL LOW (ref 135–145)

## 2024-01-28 LAB — CBC
HCT: 36.8 % — ABNORMAL LOW (ref 39.0–52.0)
Hemoglobin: 11.3 g/dL — ABNORMAL LOW (ref 13.0–17.0)
MCH: 25.8 pg — ABNORMAL LOW (ref 26.0–34.0)
MCHC: 30.7 g/dL (ref 30.0–36.0)
MCV: 84 fL (ref 80.0–100.0)
Platelets: 241 K/uL (ref 150–400)
RBC: 4.38 MIL/uL (ref 4.22–5.81)
RDW: 14.9 % (ref 11.5–15.5)
WBC: 7.3 K/uL (ref 4.0–10.5)
nRBC: 0 % (ref 0.0–0.2)

## 2024-01-28 LAB — GLUCOSE, CAPILLARY: Glucose-Capillary: 107 mg/dL — ABNORMAL HIGH (ref 70–99)

## 2024-01-28 LAB — URIC ACID: Uric Acid, Serum: 7.3 mg/dL (ref 3.7–8.6)

## 2024-01-28 MED ORDER — LUBRIDERM SERIOUSLY SENSITIVE EX LOTN
TOPICAL_LOTION | Freq: Every day | CUTANEOUS | Status: DC
  Administered 2024-01-30 – 2024-04-26 (×6): 1 via TOPICAL
  Filled 2024-01-28 (×3): qty 473

## 2024-01-29 ENCOUNTER — Inpatient Hospital Stay (HOSPITAL_COMMUNITY): Payer: Self-pay

## 2024-01-29 DIAGNOSIS — M7989 Other specified soft tissue disorders: Secondary | ICD-10-CM

## 2024-01-29 LAB — BASIC METABOLIC PANEL WITH GFR
Anion gap: 15 (ref 5–15)
BUN: 19 mg/dL (ref 6–20)
CO2: 22 mmol/L (ref 22–32)
Calcium: 9.3 mg/dL (ref 8.9–10.3)
Chloride: 98 mmol/L (ref 98–111)
Creatinine, Ser: 1.08 mg/dL (ref 0.61–1.24)
GFR, Estimated: >60 mL/min (ref >60)
Glucose, Bld: 122 mg/dL — ABNORMAL HIGH (ref 70–99)
Potassium: 4.2 mmol/L (ref 3.5–5.1)
Sodium: 135 mmol/L (ref 135–145)

## 2024-01-29 LAB — PHOSPHORUS: Phosphorus: 4 mg/dL (ref 2.5–4.6)

## 2024-01-29 LAB — C-REACTIVE PROTEIN: CRP: 11.8 mg/dL — ABNORMAL HIGH (ref <1.0)

## 2024-01-30 ENCOUNTER — Inpatient Hospital Stay (HOSPITAL_COMMUNITY): Payer: Self-pay

## 2024-01-30 DIAGNOSIS — I5031 Acute diastolic (congestive) heart failure: Secondary | ICD-10-CM

## 2024-01-30 LAB — ECHOCARDIOGRAM COMPLETE
AR max vel: 3.55 cm2
AV Area VTI: 4.18 cm2
AV Area mean vel: 3.9 cm2
AV Mean grad: 8 mmHg
AV Peak grad: 13.8 mmHg
Ao pk vel: 1.86 m/s
Area-P 1/2: 4.15 cm2
Height: 77 in
S' Lateral: 3.4 cm
Weight: 5315.73 [oz_av]

## 2024-01-30 LAB — URINALYSIS, ROUTINE W REFLEX MICROSCOPIC
Bilirubin Urine: NEGATIVE
Glucose, UA: NEGATIVE mg/dL
Hgb urine dipstick: NEGATIVE
Ketones, ur: NEGATIVE mg/dL
Leukocytes,Ua: NEGATIVE
Nitrite: NEGATIVE
Protein, ur: NEGATIVE mg/dL
Specific Gravity, Urine: 1.006 (ref 1.005–1.030)
pH: 6 (ref 5.0–8.0)

## 2024-01-30 LAB — GLUCOSE, CAPILLARY: Glucose-Capillary: 136 mg/dL — ABNORMAL HIGH (ref 70–99)

## 2024-01-30 MED ORDER — SODIUM CHLORIDE 0.9% FLUSH
10.0000 mL | Freq: Two times a day (BID) | INTRAVENOUS | Status: DC
  Administered 2024-01-30 – 2024-01-31 (×4): 10 mL

## 2024-01-30 MED ORDER — SODIUM CHLORIDE 0.9% FLUSH
10.0000 mL | INTRAVENOUS | Status: DC | PRN
  Administered 2024-04-24: 10 mL

## 2024-01-30 MED ORDER — FUROSEMIDE 10 MG/ML IJ SOLN
40.0000 mg | Freq: Once | INTRAMUSCULAR | Status: AC
  Administered 2024-01-30: 40 mg via INTRAVENOUS
  Filled 2024-01-30: qty 4

## 2024-01-31 LAB — COMPREHENSIVE METABOLIC PANEL WITH GFR
ALT: 28 U/L (ref 0–44)
AST: 23 U/L (ref 15–41)
Albumin: 3.7 g/dL (ref 3.5–5.0)
Alkaline Phosphatase: 53 U/L (ref 38–126)
Anion gap: 10 (ref 5–15)
BUN: 22 mg/dL — ABNORMAL HIGH (ref 6–20)
CO2: 27 mmol/L (ref 22–32)
Calcium: 9.4 mg/dL (ref 8.9–10.3)
Chloride: 100 mmol/L (ref 98–111)
Creatinine, Ser: 1.18 mg/dL (ref 0.61–1.24)
GFR, Estimated: >60 mL/min (ref >60)
Glucose, Bld: 111 mg/dL — ABNORMAL HIGH (ref 70–99)
Potassium: 4.2 mmol/L (ref 3.5–5.1)
Sodium: 137 mmol/L (ref 135–145)
Total Bilirubin: 0.5 mg/dL (ref 0.0–1.2)
Total Protein: 7.7 g/dL (ref 6.5–8.1)

## 2024-01-31 LAB — GLUCOSE, CAPILLARY
Glucose-Capillary: 166 mg/dL — ABNORMAL HIGH (ref 70–99)
Glucose-Capillary: 127 mg/dL — ABNORMAL HIGH (ref 70–99)

## 2024-02-01 LAB — GLUCOSE, CAPILLARY: Glucose-Capillary: 102 mg/dL — ABNORMAL HIGH (ref 70–99)

## 2024-02-02 LAB — BASIC METABOLIC PANEL WITH GFR
Anion gap: 11 (ref 5–15)
BUN: 22 mg/dL — ABNORMAL HIGH (ref 6–20)
CO2: 24 mmol/L (ref 22–32)
Calcium: 9.2 mg/dL (ref 8.9–10.3)
Chloride: 103 mmol/L (ref 98–111)
Creatinine, Ser: 1.1 mg/dL (ref 0.61–1.24)
GFR, Estimated: >60 mL/min (ref >60)
Glucose, Bld: 109 mg/dL — ABNORMAL HIGH (ref 70–99)
Potassium: 4.4 mmol/L (ref 3.5–5.1)
Sodium: 138 mmol/L (ref 135–145)

## 2024-02-02 LAB — CBC
HCT: 36.9 % — ABNORMAL LOW (ref 39.0–52.0)
Hemoglobin: 11.8 g/dL — ABNORMAL LOW (ref 13.0–17.0)
MCH: 25.9 pg — ABNORMAL LOW (ref 26.0–34.0)
MCHC: 32 g/dL (ref 30.0–36.0)
MCV: 80.9 fL (ref 80.0–100.0)
Platelets: 318 K/uL (ref 150–400)
RBC: 4.56 MIL/uL (ref 4.22–5.81)
RDW: 14.7 % (ref 11.5–15.5)
WBC: 4.1 K/uL (ref 4.0–10.5)
nRBC: 0 % (ref 0.0–0.2)

## 2024-02-02 LAB — HEMOGLOBIN A1C
Hgb A1c MFr Bld: 7.1 % — ABNORMAL HIGH (ref 4.8–5.6)
Mean Plasma Glucose: 157.07 mg/dL

## 2024-02-02 LAB — GLUCOSE, CAPILLARY: Glucose-Capillary: 102 mg/dL — ABNORMAL HIGH (ref 70–99)

## 2024-02-02 LAB — BRAIN NATRIURETIC PEPTIDE: B Natriuretic Peptide: 5.8 pg/mL (ref 0.0–100.0)

## 2024-02-02 LAB — MAGNESIUM: Magnesium: 2.1 mg/dL (ref 1.7–2.4)

## 2024-02-02 MED ORDER — FUROSEMIDE 10 MG/ML IJ SOLN
20.0000 mg | Freq: Once | INTRAMUSCULAR | Status: AC
  Administered 2024-02-02: 20 mg via INTRAVENOUS
  Filled 2024-02-02: qty 2

## 2024-02-03 LAB — GLUCOSE, CAPILLARY
Glucose-Capillary: 143 mg/dL — ABNORMAL HIGH (ref 70–99)
Glucose-Capillary: 103 mg/dL — ABNORMAL HIGH (ref 70–99)
Glucose-Capillary: 114 mg/dL — ABNORMAL HIGH (ref 70–99)
Glucose-Capillary: 132 mg/dL — ABNORMAL HIGH (ref 70–99)

## 2024-02-04 LAB — GLUCOSE, CAPILLARY
Glucose-Capillary: 113 mg/dL — ABNORMAL HIGH (ref 70–99)
Glucose-Capillary: 119 mg/dL — ABNORMAL HIGH (ref 70–99)

## 2024-02-04 MED ORDER — FUROSEMIDE 20 MG PO TABS
20.0000 mg | ORAL_TABLET | Freq: Once | ORAL | Status: AC
  Administered 2024-02-04: 20 mg via ORAL
  Filled 2024-02-04: qty 1

## 2024-02-04 MED ORDER — FUROSEMIDE 40 MG PO TABS
40.0000 mg | ORAL_TABLET | Freq: Once | ORAL | Status: AC
  Administered 2024-02-04: 40 mg via ORAL
  Filled 2024-02-04: qty 1

## 2024-02-05 LAB — BASIC METABOLIC PANEL WITH GFR
Anion gap: 10 (ref 5–15)
BUN: 27 mg/dL — ABNORMAL HIGH (ref 6–20)
CO2: 25 mmol/L (ref 22–32)
Calcium: 9.3 mg/dL (ref 8.9–10.3)
Chloride: 103 mmol/L (ref 98–111)
Creatinine, Ser: 1.17 mg/dL (ref 0.61–1.24)
GFR, Estimated: >60 mL/min (ref >60)
Glucose, Bld: 118 mg/dL — ABNORMAL HIGH (ref 70–99)
Potassium: 4.3 mmol/L (ref 3.5–5.1)
Sodium: 138 mmol/L (ref 135–145)

## 2024-02-05 LAB — GLUCOSE, CAPILLARY
Glucose-Capillary: 166 mg/dL — ABNORMAL HIGH (ref 70–99)
Glucose-Capillary: 147 mg/dL — ABNORMAL HIGH (ref 70–99)
Glucose-Capillary: 112 mg/dL — ABNORMAL HIGH (ref 70–99)
Glucose-Capillary: 129 mg/dL — ABNORMAL HIGH (ref 70–99)

## 2024-02-05 LAB — MAGNESIUM: Magnesium: 2.1 mg/dL (ref 1.7–2.4)

## 2024-02-05 MED ORDER — FUROSEMIDE 40 MG PO TABS
40.0000 mg | ORAL_TABLET | Freq: Every day | ORAL | Status: DC
  Administered 2024-02-05 – 2024-02-08 (×4): 40 mg via ORAL
  Filled 2024-02-05 (×4): qty 1

## 2024-02-06 LAB — GLUCOSE, CAPILLARY
Glucose-Capillary: 152 mg/dL — ABNORMAL HIGH (ref 70–99)
Glucose-Capillary: 171 mg/dL — ABNORMAL HIGH (ref 70–99)
Glucose-Capillary: 124 mg/dL — ABNORMAL HIGH (ref 70–99)
Glucose-Capillary: 159 mg/dL — ABNORMAL HIGH (ref 70–99)

## 2024-02-06 MED ORDER — FUROSEMIDE 10 MG/ML IJ SOLN: 20.0000 mg | Freq: Once | INTRAMUSCULAR | Status: DC

## 2024-02-06 MED ORDER — FUROSEMIDE 40 MG PO TABS
40.0000 mg | ORAL_TABLET | Freq: Once | ORAL | Status: AC
  Administered 2024-02-06: 40 mg via ORAL
  Filled 2024-02-06: qty 1

## 2024-02-07 LAB — GLUCOSE, CAPILLARY
Glucose-Capillary: 168 mg/dL — ABNORMAL HIGH (ref 70–99)
Glucose-Capillary: 130 mg/dL — ABNORMAL HIGH (ref 70–99)
Glucose-Capillary: 126 mg/dL — ABNORMAL HIGH (ref 70–99)
Glucose-Capillary: 104 mg/dL — ABNORMAL HIGH (ref 70–99)

## 2024-02-08 LAB — GLUCOSE, CAPILLARY
Glucose-Capillary: 127 mg/dL — ABNORMAL HIGH (ref 70–99)
Glucose-Capillary: 103 mg/dL — ABNORMAL HIGH (ref 70–99)
Glucose-Capillary: 117 mg/dL — ABNORMAL HIGH (ref 70–99)
Glucose-Capillary: 140 mg/dL — ABNORMAL HIGH (ref 70–99)

## 2024-02-08 MED ORDER — FUROSEMIDE 20 MG PO TABS
20.0000 mg | ORAL_TABLET | Freq: Every day | ORAL | Status: DC
  Administered 2024-02-09 – 2024-02-28 (×20): 20 mg via ORAL
  Filled 2024-02-08 (×20): qty 1

## 2024-02-09 LAB — GLUCOSE, CAPILLARY
Glucose-Capillary: 107 mg/dL — ABNORMAL HIGH (ref 70–99)
Glucose-Capillary: 102 mg/dL — ABNORMAL HIGH (ref 70–99)
Glucose-Capillary: 118 mg/dL — ABNORMAL HIGH (ref 70–99)
Glucose-Capillary: 118 mg/dL — ABNORMAL HIGH (ref 70–99)

## 2024-02-09 MED ORDER — NICOTINE POLACRILEX 2 MG MT GUM
2.0000 mg | CHEWING_GUM | OROMUCOSAL | Status: DC | PRN
  Filled 2024-02-09: qty 1

## 2024-02-10 LAB — GLUCOSE, CAPILLARY
Glucose-Capillary: 113 mg/dL — ABNORMAL HIGH (ref 70–99)
Glucose-Capillary: 98 mg/dL (ref 70–99)
Glucose-Capillary: 90 mg/dL (ref 70–99)
Glucose-Capillary: 149 mg/dL — ABNORMAL HIGH (ref 70–99)

## 2024-02-11 LAB — GLUCOSE, CAPILLARY
Glucose-Capillary: 167 mg/dL — ABNORMAL HIGH (ref 70–99)
Glucose-Capillary: 121 mg/dL — ABNORMAL HIGH (ref 70–99)

## 2024-02-12 LAB — GLUCOSE, CAPILLARY
Glucose-Capillary: 214 mg/dL — ABNORMAL HIGH (ref 70–99)
Glucose-Capillary: 120 mg/dL — ABNORMAL HIGH (ref 70–99)
Glucose-Capillary: 161 mg/dL — ABNORMAL HIGH (ref 70–99)

## 2024-02-13 LAB — GLUCOSE, CAPILLARY
Glucose-Capillary: 125 mg/dL — ABNORMAL HIGH (ref 70–99)
Glucose-Capillary: 132 mg/dL — ABNORMAL HIGH (ref 70–99)
Glucose-Capillary: 130 mg/dL — ABNORMAL HIGH (ref 70–99)

## 2024-02-13 NOTE — TOC Progression Note (Addendum)
 Transition of Care [facility]) - Progression Note    Patient Details  Name: Colton Lee MRN: [id number] Date of Birth: [birth year]  Transition of Care [facility]) CM/SW Contact  Mearl Latin, LCSW Phone Number: 02/13/2024, 8:40 AM  Clinical Narrative:    TOC continuing to follow. Barriers remain in place.  Patient's Guardian Ad Litem, Barbette Or, visited patient today to prepare for the court hearing.  CSW requested IT conduct chart merge on patient,  [id number].   Expected Discharge Plan: Skilled Nursing Facility Barriers to Discharge: Homeless with medical needs, Inadequate or no insurance  Expected Discharge Plan and Services In-house Referral: Clinical Social Work     Living arrangements for the past 2 months: Homeless                                       Social Determinants of Health (SDOH) Interventions SDOH Screenings   Food Insecurity: Patient Unable To Answer (10/25/2023)  Housing: Patient Unable To Answer (10/25/2023)  Transportation Needs: Patient Unable To Answer (10/25/2023)  Utilities: Patient Unable To Answer (10/25/2023)  Tobacco Use: High Risk (10/27/2023)    Readmission Risk Interventions     No data to display

## 2024-02-14 LAB — GLUCOSE, CAPILLARY
Glucose-Capillary: 119 mg/dL — ABNORMAL HIGH (ref 70–99)
Glucose-Capillary: 91 mg/dL (ref 70–99)
Glucose-Capillary: 148 mg/dL — ABNORMAL HIGH (ref 70–99)
Glucose-Capillary: 111 mg/dL — ABNORMAL HIGH (ref 70–99)

## 2024-02-14 MED ORDER — BENZOCAINE 10 % MT GEL
Freq: Three times a day (TID) | OROMUCOSAL | Status: DC | PRN
  Administered 2024-02-14: 1 via OROMUCOSAL
  Filled 2024-02-14 (×2): qty 9

## 2024-02-14 MED ADMIN — Chlorhexidine Gluconate Soln 0.12%: 15 mL | OROMUCOSAL | NDC 63739005269

## 2024-02-14 MED FILL — Chlorhexidine Gluconate Soln 0.12%: 15.0000 mL | OROMUCOSAL | Qty: 15 | Status: AC

## 2024-02-15 LAB — GLUCOSE, CAPILLARY
Glucose-Capillary: 123 mg/dL — ABNORMAL HIGH (ref 70–99)
Glucose-Capillary: 106 mg/dL — ABNORMAL HIGH (ref 70–99)
Glucose-Capillary: 110 mg/dL — ABNORMAL HIGH (ref 70–99)
Glucose-Capillary: 103 mg/dL — ABNORMAL HIGH (ref 70–99)

## 2024-02-15 MED ADMIN — Chlorhexidine Gluconate Soln 0.12%: 15 mL | OROMUCOSAL | NDC 53329030124

## 2024-02-16 LAB — GLUCOSE, CAPILLARY
Glucose-Capillary: 105 mg/dL — ABNORMAL HIGH (ref 70–99)
Glucose-Capillary: 122 mg/dL — ABNORMAL HIGH (ref 70–99)
Glucose-Capillary: 136 mg/dL — ABNORMAL HIGH (ref 70–99)
Glucose-Capillary: 98 mg/dL (ref 70–99)

## 2024-02-17 LAB — GLUCOSE, CAPILLARY: Glucose-Capillary: 115 mg/dL — ABNORMAL HIGH (ref 70–99)

## 2024-02-18 LAB — GLUCOSE, CAPILLARY
Glucose-Capillary: 116 mg/dL — ABNORMAL HIGH (ref 70–99)
Glucose-Capillary: 97 mg/dL (ref 70–99)
Glucose-Capillary: 154 mg/dL — ABNORMAL HIGH (ref 70–99)
Glucose-Capillary: 126 mg/dL — ABNORMAL HIGH (ref 70–99)

## 2024-02-19 LAB — GLUCOSE, CAPILLARY: Glucose-Capillary: 117 mg/dL — ABNORMAL HIGH (ref 70–99)

## 2024-02-20 LAB — GLUCOSE, CAPILLARY: Glucose-Capillary: 146 mg/dL — ABNORMAL HIGH (ref 70–99)

## 2024-02-21 LAB — GLUCOSE, CAPILLARY
Glucose-Capillary: 290 mg/dL — ABNORMAL HIGH (ref 70–99)
Glucose-Capillary: 128 mg/dL — ABNORMAL HIGH (ref 70–99)
Glucose-Capillary: 102 mg/dL — ABNORMAL HIGH (ref 70–99)

## 2024-02-22 LAB — CBC
HCT: 37.3 % — ABNORMAL LOW (ref 39.0–52.0)
Hemoglobin: 11.7 g/dL — ABNORMAL LOW (ref 13.0–17.0)
MCH: 25.4 pg — ABNORMAL LOW (ref 26.0–34.0)
MCHC: 31.4 g/dL (ref 30.0–36.0)
MCV: 81.1 fL (ref 80.0–100.0)
Platelets: 288 K/uL (ref 150–400)
RBC: 4.6 MIL/uL (ref 4.22–5.81)
RDW: 15 % (ref 11.5–15.5)
WBC: 5.2 K/uL (ref 4.0–10.5)
nRBC: 0 % (ref 0.0–0.2)

## 2024-02-22 LAB — GLUCOSE, CAPILLARY
Glucose-Capillary: 162 mg/dL — ABNORMAL HIGH (ref 70–99)
Glucose-Capillary: 108 mg/dL — ABNORMAL HIGH (ref 70–99)

## 2024-02-22 LAB — COMPREHENSIVE METABOLIC PANEL WITH GFR
ALT: 25 U/L (ref 0–44)
AST: 22 U/L (ref 15–41)
Albumin: 3.9 g/dL (ref 3.5–5.0)
Alkaline Phosphatase: 61 U/L (ref 38–126)
Anion gap: 11 (ref 5–15)
BUN: 19 mg/dL (ref 6–20)
CO2: 26 mmol/L (ref 22–32)
Calcium: 9.2 mg/dL (ref 8.9–10.3)
Chloride: 99 mmol/L (ref 98–111)
Creatinine, Ser: 0.98 mg/dL (ref 0.61–1.24)
GFR, Estimated: >60 mL/min (ref >60)
Glucose, Bld: 179 mg/dL — ABNORMAL HIGH (ref 70–99)
Potassium: 4 mmol/L (ref 3.5–5.1)
Sodium: 136 mmol/L (ref 135–145)
Total Bilirubin: 0.6 mg/dL (ref 0.0–1.2)
Total Protein: 7 g/dL (ref 6.5–8.1)

## 2024-02-22 MED ADMIN — Chlorhexidine Gluconate Soln 0.12%: 15 mL | OROMUCOSAL | NDC 53329030124

## 2024-02-23 LAB — GLUCOSE, CAPILLARY
Glucose-Capillary: 106 mg/dL — ABNORMAL HIGH (ref 70–99)
Glucose-Capillary: 116 mg/dL — ABNORMAL HIGH (ref 70–99)
Glucose-Capillary: 147 mg/dL — ABNORMAL HIGH (ref 70–99)

## 2024-02-23 MED ADMIN — Chlorhexidine Gluconate Soln 0.12%: 15 mL | OROMUCOSAL | NDC 53329030124

## 2024-02-24 LAB — GLUCOSE, CAPILLARY
Glucose-Capillary: 102 mg/dL — ABNORMAL HIGH (ref 70–99)
Glucose-Capillary: 119 mg/dL — ABNORMAL HIGH (ref 70–99)
Glucose-Capillary: 119 mg/dL — ABNORMAL HIGH (ref 70–99)
Glucose-Capillary: 142 mg/dL — ABNORMAL HIGH (ref 70–99)

## 2024-02-24 MED ADMIN — Chlorhexidine Gluconate Soln 0.12%: 15 mL | OROMUCOSAL | NDC 53329030124

## 2024-02-25 LAB — GLUCOSE, CAPILLARY
Glucose-Capillary: 162 mg/dL — ABNORMAL HIGH (ref 70–99)
Glucose-Capillary: 121 mg/dL — ABNORMAL HIGH (ref 70–99)
Glucose-Capillary: 158 mg/dL — ABNORMAL HIGH (ref 70–99)

## 2024-02-25 MED ADMIN — Chlorhexidine Gluconate Soln 0.12%: 15 mL | OROMUCOSAL | NDC 53329030124

## 2024-02-26 LAB — GLUCOSE, CAPILLARY
Glucose-Capillary: 361 mg/dL — ABNORMAL HIGH (ref 70–99)
Glucose-Capillary: 183 mg/dL — ABNORMAL HIGH (ref 70–99)

## 2024-02-26 MED ADMIN — Chlorhexidine Gluconate Soln 0.12%: 15 mL | OROMUCOSAL | NDC 53329030124

## 2024-02-27 LAB — BASIC METABOLIC PANEL WITH GFR
Anion gap: 11 (ref 5–15)
BUN: 19 mg/dL (ref 6–20)
CO2: 27 mmol/L (ref 22–32)
Calcium: 9.5 mg/dL (ref 8.9–10.3)
Chloride: 101 mmol/L (ref 98–111)
Creatinine, Ser: 1.08 mg/dL (ref 0.61–1.24)
GFR, Estimated: >60 mL/min (ref >60)
Glucose, Bld: 113 mg/dL — ABNORMAL HIGH (ref 70–99)
Potassium: 3.9 mmol/L (ref 3.5–5.1)
Sodium: 139 mmol/L (ref 135–145)

## 2024-02-27 LAB — GLUCOSE, CAPILLARY
Glucose-Capillary: 101 mg/dL — ABNORMAL HIGH (ref 70–99)
Glucose-Capillary: 116 mg/dL — ABNORMAL HIGH (ref 70–99)
Glucose-Capillary: 151 mg/dL — ABNORMAL HIGH (ref 70–99)
Glucose-Capillary: 133 mg/dL — ABNORMAL HIGH (ref 70–99)
Glucose-Capillary: 152 mg/dL — ABNORMAL HIGH (ref 70–99)

## 2024-02-27 LAB — MAGNESIUM: Magnesium: 2.1 mg/dL (ref 1.7–2.4)

## 2024-02-27 LAB — HEMOGLOBIN A1C
Hgb A1c MFr Bld: 6.8 % — ABNORMAL HIGH (ref 4.8–5.6)
Mean Plasma Glucose: 148.46 mg/dL

## 2024-02-28 LAB — GLUCOSE, CAPILLARY
Glucose-Capillary: 161 mg/dL — ABNORMAL HIGH (ref 70–99)
Glucose-Capillary: 96 mg/dL (ref 70–99)
Glucose-Capillary: 137 mg/dL — ABNORMAL HIGH (ref 70–99)
Glucose-Capillary: 132 mg/dL — ABNORMAL HIGH (ref 70–99)

## 2024-02-29 LAB — GLUCOSE, CAPILLARY
Glucose-Capillary: 147 mg/dL — ABNORMAL HIGH (ref 70–99)
Glucose-Capillary: 140 mg/dL — ABNORMAL HIGH (ref 70–99)
Glucose-Capillary: 143 mg/dL — ABNORMAL HIGH (ref 70–99)
Glucose-Capillary: 120 mg/dL — ABNORMAL HIGH (ref 70–99)

## 2024-02-29 MED ORDER — FUROSEMIDE 40 MG PO TABS
40.0000 mg | ORAL_TABLET | Freq: Every day | ORAL | Status: DC
  Administered 2024-02-29 – 2024-03-26 (×27): 40 mg via ORAL
  Filled 2024-02-29 (×27): qty 1

## 2024-02-29 MED ADMIN — Chlorhexidine Gluconate Soln 0.12%: 15 mL | OROMUCOSAL | NDC 53329030124

## 2024-03-01 LAB — GLUCOSE, CAPILLARY
Glucose-Capillary: 129 mg/dL — ABNORMAL HIGH (ref 70–99)
Glucose-Capillary: 121 mg/dL — ABNORMAL HIGH (ref 70–99)
Glucose-Capillary: 164 mg/dL — ABNORMAL HIGH (ref 70–99)

## 2024-03-02 LAB — GLUCOSE, CAPILLARY
Glucose-Capillary: 153 mg/dL — ABNORMAL HIGH (ref 70–99)
Glucose-Capillary: 148 mg/dL — ABNORMAL HIGH (ref 70–99)

## 2024-03-03 LAB — CBC
HCT: 39.8 % (ref 39.0–52.0)
Hemoglobin: 12.1 g/dL — ABNORMAL LOW (ref 13.0–17.0)
MCH: 25.3 pg — ABNORMAL LOW (ref 26.0–34.0)
MCHC: 30.4 g/dL (ref 30.0–36.0)
MCV: 83.1 fL (ref 80.0–100.0)
Platelets: 174 K/uL (ref 150–400)
RBC: 4.79 MIL/uL (ref 4.22–5.81)
RDW: 15.1 % (ref 11.5–15.5)
WBC: 4.9 K/uL (ref 4.0–10.5)
nRBC: 0 % (ref 0.0–0.2)

## 2024-03-03 LAB — GLUCOSE, CAPILLARY
Glucose-Capillary: 161 mg/dL — ABNORMAL HIGH (ref 70–99)
Glucose-Capillary: 108 mg/dL — ABNORMAL HIGH (ref 70–99)
Glucose-Capillary: 125 mg/dL — ABNORMAL HIGH (ref 70–99)
Glucose-Capillary: 108 mg/dL — ABNORMAL HIGH (ref 70–99)

## 2024-03-03 MED ORDER — OLANZAPINE 5 MG PO TBDP
5.0000 mg | ORAL_TABLET | Freq: Two times a day (BID) | ORAL | Status: DC
  Administered 2024-03-03 – 2024-04-22 (×98): 5 mg via ORAL
  Filled 2024-03-03 (×106): qty 1

## 2024-03-03 MED ADMIN — Chlorhexidine Gluconate Soln 0.12%: 15 mL | OROMUCOSAL | NDC 53329030124

## 2024-03-04 LAB — GLUCOSE, CAPILLARY
Glucose-Capillary: 135 mg/dL — ABNORMAL HIGH (ref 70–99)
Glucose-Capillary: 120 mg/dL — ABNORMAL HIGH (ref 70–99)
Glucose-Capillary: 119 mg/dL — ABNORMAL HIGH (ref 70–99)

## 2024-03-04 MED ADMIN — Chlorhexidine Gluconate Soln 0.12%: 15 mL | OROMUCOSAL | NDC 53329030124

## 2024-03-04 NOTE — Consult Note (Signed)
  Patient seen this morning. Found him in room watching TV. He presented pleasant, calm and not agitated as he was yesterday. No behavorial issues reported overnight.

## 2024-03-05 DIAGNOSIS — F02818 Dementia in other diseases classified elsewhere, unspecified severity, with other behavioral disturbance: Secondary | ICD-10-CM

## 2024-03-05 LAB — GLUCOSE, CAPILLARY
Glucose-Capillary: 121 mg/dL — ABNORMAL HIGH (ref 70–99)
Glucose-Capillary: 134 mg/dL — ABNORMAL HIGH (ref 70–99)
Glucose-Capillary: 127 mg/dL — ABNORMAL HIGH (ref 70–99)
Glucose-Capillary: 106 mg/dL — ABNORMAL HIGH (ref 70–99)

## 2024-03-06 LAB — GLUCOSE, CAPILLARY
Glucose-Capillary: 140 mg/dL — ABNORMAL HIGH (ref 70–99)
Glucose-Capillary: 227 mg/dL — ABNORMAL HIGH (ref 70–99)
Glucose-Capillary: 115 mg/dL — ABNORMAL HIGH (ref 70–99)
Glucose-Capillary: 158 mg/dL — ABNORMAL HIGH (ref 70–99)

## 2024-03-07 LAB — CBC WITH DIFFERENTIAL/PLATELET
Abs Immature Granulocytes: 0.02 K/uL (ref 0.00–0.07)
Basophils Absolute: 0 K/uL (ref 0.0–0.1)
Basophils Relative: 1 %
Eosinophils Absolute: 0.1 K/uL (ref 0.0–0.5)
Eosinophils Relative: 2 %
HCT: 38.9 % — ABNORMAL LOW (ref 39.0–52.0)
Hemoglobin: 12 g/dL — ABNORMAL LOW (ref 13.0–17.0)
Immature Granulocytes: 0 %
Lymphocytes Relative: 40 %
Lymphs Abs: 1.9 K/uL (ref 0.7–4.0)
MCH: 25.3 pg — ABNORMAL LOW (ref 26.0–34.0)
MCHC: 30.8 g/dL (ref 30.0–36.0)
MCV: 82.1 fL (ref 80.0–100.0)
Monocytes Absolute: 0.4 K/uL (ref 0.1–1.0)
Monocytes Relative: 9 %
Neutro Abs: 2.3 K/uL (ref 1.7–7.7)
Neutrophils Relative %: 48 %
Platelets: 300 K/uL (ref 150–400)
RBC: 4.74 MIL/uL (ref 4.22–5.81)
RDW: 15 % (ref 11.5–15.5)
WBC: 4.8 K/uL (ref 4.0–10.5)
nRBC: 0 % (ref 0.0–0.2)

## 2024-03-07 LAB — BASIC METABOLIC PANEL WITH GFR
Anion gap: 9 (ref 5–15)
BUN: 21 mg/dL — ABNORMAL HIGH (ref 6–20)
CO2: 25 mmol/L (ref 22–32)
Calcium: 9 mg/dL (ref 8.9–10.3)
Chloride: 103 mmol/L (ref 98–111)
Creatinine, Ser: 1.15 mg/dL (ref 0.61–1.24)
GFR, Estimated: >60 mL/min (ref >60)
Glucose, Bld: 161 mg/dL — ABNORMAL HIGH (ref 70–99)
Potassium: 4.1 mmol/L (ref 3.5–5.1)
Sodium: 137 mmol/L (ref 135–145)

## 2024-03-07 LAB — GLUCOSE, CAPILLARY
Glucose-Capillary: 121 mg/dL — ABNORMAL HIGH (ref 70–99)
Glucose-Capillary: 127 mg/dL — ABNORMAL HIGH (ref 70–99)
Glucose-Capillary: 132 mg/dL — ABNORMAL HIGH (ref 70–99)

## 2024-03-07 LAB — MAGNESIUM: Magnesium: 2 mg/dL (ref 1.7–2.4)

## 2024-03-08 ENCOUNTER — Encounter (HOSPITAL_COMMUNITY): Payer: Self-pay

## 2024-03-08 LAB — GLUCOSE, CAPILLARY
Glucose-Capillary: 123 mg/dL — ABNORMAL HIGH (ref 70–99)
Glucose-Capillary: 138 mg/dL — ABNORMAL HIGH (ref 70–99)
Glucose-Capillary: 138 mg/dL — ABNORMAL HIGH (ref 70–99)
Glucose-Capillary: 116 mg/dL — ABNORMAL HIGH (ref 70–99)

## 2024-03-09 ENCOUNTER — Inpatient Hospital Stay (HOSPITAL_COMMUNITY): Payer: Self-pay

## 2024-03-09 DIAGNOSIS — R609 Edema, unspecified: Secondary | ICD-10-CM

## 2024-03-09 LAB — GLUCOSE, CAPILLARY
Glucose-Capillary: 133 mg/dL — ABNORMAL HIGH (ref 70–99)
Glucose-Capillary: 160 mg/dL — ABNORMAL HIGH (ref 70–99)

## 2024-03-10 LAB — GLUCOSE, CAPILLARY
Glucose-Capillary: 143 mg/dL — ABNORMAL HIGH (ref 70–99)
Glucose-Capillary: 89 mg/dL (ref 70–99)
Glucose-Capillary: 102 mg/dL — ABNORMAL HIGH (ref 70–99)
Glucose-Capillary: 124 mg/dL — ABNORMAL HIGH (ref 70–99)

## 2024-03-11 LAB — GLUCOSE, CAPILLARY
Glucose-Capillary: 165 mg/dL — ABNORMAL HIGH (ref 70–99)
Glucose-Capillary: 99 mg/dL (ref 70–99)
Glucose-Capillary: 132 mg/dL — ABNORMAL HIGH (ref 70–99)
Glucose-Capillary: 167 mg/dL — ABNORMAL HIGH (ref 70–99)

## 2024-03-12 LAB — GLUCOSE, CAPILLARY
Glucose-Capillary: 162 mg/dL — ABNORMAL HIGH (ref 70–99)
Glucose-Capillary: 104 mg/dL — ABNORMAL HIGH (ref 70–99)
Glucose-Capillary: 141 mg/dL — ABNORMAL HIGH (ref 70–99)

## 2024-03-13 LAB — GLUCOSE, CAPILLARY
Glucose-Capillary: 213 mg/dL — ABNORMAL HIGH (ref 70–99)
Glucose-Capillary: 112 mg/dL — ABNORMAL HIGH (ref 70–99)
Glucose-Capillary: 160 mg/dL — ABNORMAL HIGH (ref 70–99)
Glucose-Capillary: 183 mg/dL — ABNORMAL HIGH (ref 70–99)

## 2024-03-14 LAB — GLUCOSE, CAPILLARY
Glucose-Capillary: 196 mg/dL — ABNORMAL HIGH (ref 70–99)
Glucose-Capillary: 110 mg/dL — ABNORMAL HIGH (ref 70–99)
Glucose-Capillary: 99 mg/dL (ref 70–99)
Glucose-Capillary: 156 mg/dL — ABNORMAL HIGH (ref 70–99)

## 2024-03-15 LAB — GLUCOSE, CAPILLARY
Glucose-Capillary: 163 mg/dL — ABNORMAL HIGH (ref 70–99)
Glucose-Capillary: 143 mg/dL — ABNORMAL HIGH (ref 70–99)
Glucose-Capillary: 188 mg/dL — ABNORMAL HIGH (ref 70–99)
Glucose-Capillary: 138 mg/dL — ABNORMAL HIGH (ref 70–99)

## 2024-03-16 LAB — GLUCOSE, CAPILLARY
Glucose-Capillary: 216 mg/dL — ABNORMAL HIGH (ref 70–99)
Glucose-Capillary: 105 mg/dL — ABNORMAL HIGH (ref 70–99)
Glucose-Capillary: 137 mg/dL — ABNORMAL HIGH (ref 70–99)

## 2024-03-17 LAB — CBC
HCT: 37.7 % — ABNORMAL LOW (ref 39.0–52.0)
Hemoglobin: 11.6 g/dL — ABNORMAL LOW (ref 13.0–17.0)
MCH: 25.2 pg — ABNORMAL LOW (ref 26.0–34.0)
MCHC: 30.8 g/dL (ref 30.0–36.0)
MCV: 81.8 fL (ref 80.0–100.0)
Platelets: 266 K/uL (ref 150–400)
RBC: 4.61 MIL/uL (ref 4.22–5.81)
RDW: 15 % (ref 11.5–15.5)
WBC: 4.2 K/uL (ref 4.0–10.5)
nRBC: 0 % (ref 0.0–0.2)

## 2024-03-17 LAB — BASIC METABOLIC PANEL WITH GFR
Anion gap: 10 (ref 5–15)
BUN: 18 mg/dL (ref 6–20)
CO2: 25 mmol/L (ref 22–32)
Calcium: 9.2 mg/dL (ref 8.9–10.3)
Chloride: 102 mmol/L (ref 98–111)
Creatinine, Ser: 0.91 mg/dL (ref 0.61–1.24)
GFR, Estimated: >60 mL/min (ref >60)
Glucose, Bld: 144 mg/dL — ABNORMAL HIGH (ref 70–99)
Potassium: 4 mmol/L (ref 3.5–5.1)
Sodium: 137 mmol/L (ref 135–145)

## 2024-03-17 LAB — GLUCOSE, CAPILLARY
Glucose-Capillary: 141 mg/dL — ABNORMAL HIGH (ref 70–99)
Glucose-Capillary: 116 mg/dL — ABNORMAL HIGH (ref 70–99)
Glucose-Capillary: 152 mg/dL — ABNORMAL HIGH (ref 70–99)

## 2024-03-18 LAB — GLUCOSE, CAPILLARY
Glucose-Capillary: 196 mg/dL — ABNORMAL HIGH (ref 70–99)
Glucose-Capillary: 139 mg/dL — ABNORMAL HIGH (ref 70–99)
Glucose-Capillary: 169 mg/dL — ABNORMAL HIGH (ref 70–99)

## 2024-03-19 LAB — GLUCOSE, CAPILLARY
Glucose-Capillary: 176 mg/dL — ABNORMAL HIGH (ref 70–99)
Glucose-Capillary: 163 mg/dL — ABNORMAL HIGH (ref 70–99)
Glucose-Capillary: 141 mg/dL — ABNORMAL HIGH (ref 70–99)
Glucose-Capillary: 143 mg/dL — ABNORMAL HIGH (ref 70–99)

## 2024-03-20 LAB — GLUCOSE, CAPILLARY
Glucose-Capillary: 144 mg/dL — ABNORMAL HIGH (ref 70–99)
Glucose-Capillary: 145 mg/dL — ABNORMAL HIGH (ref 70–99)
Glucose-Capillary: 182 mg/dL — ABNORMAL HIGH (ref 70–99)
Glucose-Capillary: 169 mg/dL — ABNORMAL HIGH (ref 70–99)

## 2024-03-21 LAB — GLUCOSE, CAPILLARY
Glucose-Capillary: 176 mg/dL — ABNORMAL HIGH (ref 70–99)
Glucose-Capillary: 188 mg/dL — ABNORMAL HIGH (ref 70–99)
Glucose-Capillary: 178 mg/dL — ABNORMAL HIGH (ref 70–99)

## 2024-03-22 LAB — GLUCOSE, CAPILLARY
Glucose-Capillary: 115 mg/dL — ABNORMAL HIGH (ref 70–99)
Glucose-Capillary: 129 mg/dL — ABNORMAL HIGH (ref 70–99)
Glucose-Capillary: 133 mg/dL — ABNORMAL HIGH (ref 70–99)
Glucose-Capillary: 160 mg/dL — ABNORMAL HIGH (ref 70–99)
Glucose-Capillary: 174 mg/dL — ABNORMAL HIGH (ref 70–99)
Glucose-Capillary: 201 mg/dL — ABNORMAL HIGH (ref 70–99)

## 2024-03-23 LAB — GLUCOSE, CAPILLARY
Glucose-Capillary: 179 mg/dL — ABNORMAL HIGH (ref 70–99)
Glucose-Capillary: 139 mg/dL — ABNORMAL HIGH (ref 70–99)

## 2024-03-24 LAB — GLUCOSE, CAPILLARY
Glucose-Capillary: 144 mg/dL — ABNORMAL HIGH (ref 70–99)
Glucose-Capillary: 149 mg/dL — ABNORMAL HIGH (ref 70–99)
Glucose-Capillary: 135 mg/dL — ABNORMAL HIGH (ref 70–99)
Glucose-Capillary: 158 mg/dL — ABNORMAL HIGH (ref 70–99)

## 2024-03-24 MED ORDER — DICLOFENAC SODIUM 1 % EX GEL
2.0000 g | Freq: Four times a day (QID) | CUTANEOUS | Status: AC
  Administered 2024-03-24 – 2024-03-26 (×8): 2 g via TOPICAL
  Filled 2024-03-24: qty 100

## 2024-03-25 LAB — LIPID PANEL
Cholesterol: 106 mg/dL (ref 0–200)
HDL: 40 mg/dL — ABNORMAL LOW (ref >40)
LDL Cholesterol: 31 mg/dL (ref 0–99)
Total CHOL/HDL Ratio: 2.7 ratio
Triglycerides: 175 mg/dL — ABNORMAL HIGH (ref <150)
VLDL: 35 mg/dL (ref 0–40)

## 2024-03-25 LAB — CBC WITH DIFFERENTIAL/PLATELET
Abs Immature Granulocytes: 0.01 K/uL (ref 0.00–0.07)
Basophils Absolute: 0 K/uL (ref 0.0–0.1)
Basophils Relative: 1 %
Eosinophils Absolute: 0.1 K/uL (ref 0.0–0.5)
Eosinophils Relative: 2 %
HCT: 35.2 % — ABNORMAL LOW (ref 39.0–52.0)
Hemoglobin: 11.1 g/dL — ABNORMAL LOW (ref 13.0–17.0)
Immature Granulocytes: 0 %
Lymphocytes Relative: 41 %
Lymphs Abs: 1.7 K/uL (ref 0.7–4.0)
MCH: 25.7 pg — ABNORMAL LOW (ref 26.0–34.0)
MCHC: 31.5 g/dL (ref 30.0–36.0)
MCV: 81.5 fL (ref 80.0–100.0)
Monocytes Absolute: 0.5 K/uL (ref 0.1–1.0)
Monocytes Relative: 12 %
Neutro Abs: 1.9 K/uL (ref 1.7–7.7)
Neutrophils Relative %: 44 %
Platelets: 263 K/uL (ref 150–400)
RBC: 4.32 MIL/uL (ref 4.22–5.81)
RDW: 15.4 % (ref 11.5–15.5)
WBC: 4.3 K/uL (ref 4.0–10.5)
nRBC: 0 % (ref 0.0–0.2)

## 2024-03-25 LAB — BASIC METABOLIC PANEL WITH GFR
Anion gap: 11 (ref 5–15)
BUN: 20 mg/dL (ref 6–20)
CO2: 24 mmol/L (ref 22–32)
Calcium: 9.1 mg/dL (ref 8.9–10.3)
Chloride: 103 mmol/L (ref 98–111)
Creatinine, Ser: 0.88 mg/dL (ref 0.61–1.24)
GFR, Estimated: >60 mL/min (ref >60)
Glucose, Bld: 151 mg/dL — ABNORMAL HIGH (ref 70–99)
Potassium: 4.1 mmol/L (ref 3.5–5.1)
Sodium: 138 mmol/L (ref 135–145)

## 2024-03-25 LAB — MAGNESIUM: Magnesium: 2 mg/dL (ref 1.7–2.4)

## 2024-03-25 LAB — GLUCOSE, CAPILLARY
Glucose-Capillary: 148 mg/dL — ABNORMAL HIGH (ref 70–99)
Glucose-Capillary: 129 mg/dL — ABNORMAL HIGH (ref 70–99)
Glucose-Capillary: 170 mg/dL — ABNORMAL HIGH (ref 70–99)
Glucose-Capillary: 209 mg/dL — ABNORMAL HIGH (ref 70–99)

## 2024-03-25 LAB — TSH: TSH: 0.858 u[IU]/mL (ref 0.350–4.500)

## 2024-03-25 LAB — T4, FREE: Free T4: 0.64 ng/dL (ref 0.61–1.12)

## 2024-03-26 LAB — GLUCOSE, CAPILLARY
Glucose-Capillary: 197 mg/dL — ABNORMAL HIGH (ref 70–99)
Glucose-Capillary: 115 mg/dL — ABNORMAL HIGH (ref 70–99)
Glucose-Capillary: 169 mg/dL — ABNORMAL HIGH (ref 70–99)
Glucose-Capillary: 168 mg/dL — ABNORMAL HIGH (ref 70–99)

## 2024-03-26 MED ORDER — FUROSEMIDE 10 MG/ML IJ SOLN
40.0000 mg | Freq: Once | INTRAMUSCULAR | Status: DC
  Filled 2024-03-26: qty 4

## 2024-03-26 MED ORDER — FUROSEMIDE 40 MG PO TABS: 40.0000 mg | ORAL_TABLET | Freq: Two times a day (BID) | ORAL | Status: DC

## 2024-03-26 MED ADMIN — Metolazone Tab 2.5 MG: 2.5 mg | ORAL | NDC 81005012401

## 2024-03-26 MED FILL — Metolazone Tab 2.5 MG: 2.5000 mg | ORAL | Qty: 1 | Status: AC

## 2024-03-27 LAB — GLUCOSE, CAPILLARY
Glucose-Capillary: 242 mg/dL — ABNORMAL HIGH (ref 70–99)
Glucose-Capillary: 140 mg/dL — ABNORMAL HIGH (ref 70–99)
Glucose-Capillary: 145 mg/dL — ABNORMAL HIGH (ref 70–99)
Glucose-Capillary: 146 mg/dL — ABNORMAL HIGH (ref 70–99)

## 2024-03-27 MED ORDER — FUROSEMIDE 40 MG PO TABS
60.0000 mg | ORAL_TABLET | Freq: Every day | ORAL | Status: DC
  Administered 2024-03-27 – 2024-04-08 (×13): 60 mg via ORAL
  Filled 2024-03-27 (×13): qty 1

## 2024-03-28 LAB — GLUCOSE, CAPILLARY
Glucose-Capillary: 289 mg/dL — ABNORMAL HIGH (ref 70–99)
Glucose-Capillary: 163 mg/dL — ABNORMAL HIGH (ref 70–99)
Glucose-Capillary: 135 mg/dL — ABNORMAL HIGH (ref 70–99)
Glucose-Capillary: 131 mg/dL — ABNORMAL HIGH (ref 70–99)

## 2024-03-29 LAB — GLUCOSE, CAPILLARY
Glucose-Capillary: 208 mg/dL — ABNORMAL HIGH (ref 70–99)
Glucose-Capillary: 163 mg/dL — ABNORMAL HIGH (ref 70–99)
Glucose-Capillary: 174 mg/dL — ABNORMAL HIGH (ref 70–99)
Glucose-Capillary: 163 mg/dL — ABNORMAL HIGH (ref 70–99)

## 2024-03-29 MED ORDER — METOLAZONE 2.5 MG PO TABS
2.5000 mg | ORAL_TABLET | Freq: Once | ORAL | Status: AC
  Administered 2024-03-29: 2.5 mg via ORAL
  Filled 2024-03-29: qty 1

## 2024-03-30 LAB — BASIC METABOLIC PANEL WITH GFR
Anion gap: 10 (ref 5–15)
BUN: 32 mg/dL — ABNORMAL HIGH (ref 6–20)
CO2: 32 mmol/L (ref 22–32)
Calcium: 9.6 mg/dL (ref 8.9–10.3)
Chloride: 97 mmol/L — ABNORMAL LOW (ref 98–111)
Creatinine, Ser: 1.17 mg/dL (ref 0.61–1.24)
GFR, Estimated: >60 mL/min (ref >60)
Glucose, Bld: 141 mg/dL — ABNORMAL HIGH (ref 70–99)
Potassium: 3.6 mmol/L (ref 3.5–5.1)
Sodium: 139 mmol/L (ref 135–145)

## 2024-03-30 LAB — GLUCOSE, CAPILLARY
Glucose-Capillary: 230 mg/dL — ABNORMAL HIGH (ref 70–99)
Glucose-Capillary: 168 mg/dL — ABNORMAL HIGH (ref 70–99)
Glucose-Capillary: 143 mg/dL — ABNORMAL HIGH (ref 70–99)

## 2024-03-30 MED ORDER — APIXABAN 2.5 MG PO TABS
2.5000 mg | ORAL_TABLET | Freq: Two times a day (BID) | ORAL | Status: DC
  Administered 2024-03-30 – 2024-06-05 (×135): 2.5 mg via ORAL
  Filled 2024-03-30 (×136): qty 1

## 2024-03-31 LAB — GLUCOSE, CAPILLARY
Glucose-Capillary: 191 mg/dL — ABNORMAL HIGH (ref 70–99)
Glucose-Capillary: 160 mg/dL — ABNORMAL HIGH (ref 70–99)

## 2024-04-01 LAB — GLUCOSE, CAPILLARY
Glucose-Capillary: 191 mg/dL — ABNORMAL HIGH (ref 70–99)
Glucose-Capillary: 167 mg/dL — ABNORMAL HIGH (ref 70–99)
Glucose-Capillary: 185 mg/dL — ABNORMAL HIGH (ref 70–99)
Glucose-Capillary: 192 mg/dL — ABNORMAL HIGH (ref 70–99)

## 2024-04-02 LAB — GLUCOSE, CAPILLARY
Glucose-Capillary: 205 mg/dL — ABNORMAL HIGH (ref 70–99)
Glucose-Capillary: 147 mg/dL — ABNORMAL HIGH (ref 70–99)
Glucose-Capillary: 151 mg/dL — ABNORMAL HIGH (ref 70–99)
Glucose-Capillary: 237 mg/dL — ABNORMAL HIGH (ref 70–99)

## 2024-04-02 MED ORDER — METOLAZONE 2.5 MG PO TABS
2.5000 mg | ORAL_TABLET | Freq: Once | ORAL | Status: AC
  Administered 2024-04-02: 2.5 mg via ORAL
  Filled 2024-04-02: qty 1

## 2024-04-02 MED ORDER — POTASSIUM CHLORIDE CRYS ER 20 MEQ PO TBCR
40.0000 meq | EXTENDED_RELEASE_TABLET | Freq: Once | ORAL | Status: AC
  Administered 2024-04-02: 40 meq via ORAL
  Filled 2024-04-02: qty 2

## 2024-04-03 LAB — GLUCOSE, CAPILLARY
Glucose-Capillary: 212 mg/dL — ABNORMAL HIGH (ref 70–99)
Glucose-Capillary: 151 mg/dL — ABNORMAL HIGH (ref 70–99)
Glucose-Capillary: 149 mg/dL — ABNORMAL HIGH (ref 70–99)
Glucose-Capillary: 188 mg/dL — ABNORMAL HIGH (ref 70–99)

## 2024-04-04 LAB — CBC WITH DIFFERENTIAL/PLATELET
Abs Immature Granulocytes: 0.03 K/uL (ref 0.00–0.07)
Basophils Absolute: 0 K/uL (ref 0.0–0.1)
Basophils Relative: 0 %
Eosinophils Absolute: 0.1 K/uL (ref 0.0–0.5)
Eosinophils Relative: 2 %
HCT: 36.9 % — ABNORMAL LOW (ref 39.0–52.0)
Hemoglobin: 11.6 g/dL — ABNORMAL LOW (ref 13.0–17.0)
Immature Granulocytes: 1 %
Lymphocytes Relative: 28 %
Lymphs Abs: 1.4 K/uL (ref 0.7–4.0)
MCH: 25.2 pg — ABNORMAL LOW (ref 26.0–34.0)
MCHC: 31.4 g/dL (ref 30.0–36.0)
MCV: 80 fL (ref 80.0–100.0)
Monocytes Absolute: 0.5 K/uL (ref 0.1–1.0)
Monocytes Relative: 9 %
Neutro Abs: 3 K/uL (ref 1.7–7.7)
Neutrophils Relative %: 60 %
Platelets: 309 K/uL (ref 150–400)
RBC: 4.61 MIL/uL (ref 4.22–5.81)
RDW: 15.5 % (ref 11.5–15.5)
WBC: 5.1 K/uL (ref 4.0–10.5)
nRBC: 0 % (ref 0.0–0.2)

## 2024-04-04 LAB — MAGNESIUM: Magnesium: 2.1 mg/dL (ref 1.7–2.4)

## 2024-04-04 LAB — PHOSPHORUS: Phosphorus: 3.9 mg/dL (ref 2.5–4.6)

## 2024-04-04 LAB — BASIC METABOLIC PANEL WITH GFR
Anion gap: 11 (ref 5–15)
BUN: 26 mg/dL — ABNORMAL HIGH (ref 6–20)
CO2: 29 mmol/L (ref 22–32)
Calcium: 9.5 mg/dL (ref 8.9–10.3)
Chloride: 97 mmol/L — ABNORMAL LOW (ref 98–111)
Creatinine, Ser: 1 mg/dL (ref 0.61–1.24)
GFR, Estimated: >60 mL/min (ref >60)
Glucose, Bld: 159 mg/dL — ABNORMAL HIGH (ref 70–99)
Potassium: 3.6 mmol/L (ref 3.5–5.1)
Sodium: 137 mmol/L (ref 135–145)

## 2024-04-04 LAB — GLUCOSE, CAPILLARY
Glucose-Capillary: 169 mg/dL — ABNORMAL HIGH (ref 70–99)
Glucose-Capillary: 129 mg/dL — ABNORMAL HIGH (ref 70–99)
Glucose-Capillary: 192 mg/dL — ABNORMAL HIGH (ref 70–99)

## 2024-04-05 ENCOUNTER — Inpatient Hospital Stay (HOSPITAL_COMMUNITY): Payer: Self-pay

## 2024-04-05 LAB — GLUCOSE, CAPILLARY
Glucose-Capillary: 197 mg/dL — ABNORMAL HIGH (ref 70–99)
Glucose-Capillary: 169 mg/dL — ABNORMAL HIGH (ref 70–99)
Glucose-Capillary: 154 mg/dL — ABNORMAL HIGH (ref 70–99)

## 2024-04-05 MED ORDER — POTASSIUM CHLORIDE CRYS ER 20 MEQ PO TBCR
20.0000 meq | EXTENDED_RELEASE_TABLET | Freq: Once | ORAL | Status: AC
  Administered 2024-04-05: 20 meq via ORAL
  Filled 2024-04-05: qty 1

## 2024-04-06 LAB — GLUCOSE, CAPILLARY
Glucose-Capillary: 300 mg/dL — ABNORMAL HIGH (ref 70–99)
Glucose-Capillary: 167 mg/dL — ABNORMAL HIGH (ref 70–99)
Glucose-Capillary: 154 mg/dL — ABNORMAL HIGH (ref 70–99)
Glucose-Capillary: 161 mg/dL — ABNORMAL HIGH (ref 70–99)

## 2024-04-06 MED ORDER — DICLOFENAC SODIUM 1 % EX GEL
2.0000 g | Freq: Three times a day (TID) | CUTANEOUS | Status: AC
  Administered 2024-04-06 – 2024-04-08 (×11): 2 g via TOPICAL
  Filled 2024-04-06: qty 100

## 2024-04-07 LAB — GLUCOSE, CAPILLARY
Glucose-Capillary: 176 mg/dL — ABNORMAL HIGH (ref 70–99)
Glucose-Capillary: 213 mg/dL — ABNORMAL HIGH (ref 70–99)

## 2024-04-08 MED ORDER — METFORMIN HCL 850 MG PO TABS
850.0000 mg | ORAL_TABLET | Freq: Two times a day (BID) | ORAL | Status: DC
  Administered 2024-04-08 – 2024-04-14 (×13): 850 mg via ORAL
  Filled 2024-04-08 (×14): qty 1

## 2024-04-09 LAB — GLUCOSE, CAPILLARY
Glucose-Capillary: 220 mg/dL — ABNORMAL HIGH (ref 70–99)
Glucose-Capillary: 140 mg/dL — ABNORMAL HIGH (ref 70–99)
Glucose-Capillary: 155 mg/dL — ABNORMAL HIGH (ref 70–99)
Glucose-Capillary: 152 mg/dL — ABNORMAL HIGH (ref 70–99)

## 2024-04-09 MED ORDER — FUROSEMIDE 40 MG PO TABS
40.0000 mg | ORAL_TABLET | Freq: Every day | ORAL | Status: DC
  Administered 2024-04-09 – 2024-04-13 (×5): 40 mg via ORAL
  Filled 2024-04-09 (×5): qty 1

## 2024-04-09 MED ORDER — EMPAGLIFLOZIN 10 MG PO TABS
10.0000 mg | ORAL_TABLET | Freq: Every day | ORAL | Status: DC
  Administered 2024-04-09 – 2024-04-22 (×14): 10 mg via ORAL
  Filled 2024-04-09 (×15): qty 1

## 2024-04-10 LAB — GLUCOSE, CAPILLARY
Glucose-Capillary: 182 mg/dL — ABNORMAL HIGH (ref 70–99)
Glucose-Capillary: 118 mg/dL — ABNORMAL HIGH (ref 70–99)
Glucose-Capillary: 131 mg/dL — ABNORMAL HIGH (ref 70–99)
Glucose-Capillary: 157 mg/dL — ABNORMAL HIGH (ref 70–99)

## 2024-04-11 LAB — GLUCOSE, CAPILLARY
Glucose-Capillary: 177 mg/dL — ABNORMAL HIGH (ref 70–99)
Glucose-Capillary: 145 mg/dL — ABNORMAL HIGH (ref 70–99)
Glucose-Capillary: 126 mg/dL — ABNORMAL HIGH (ref 70–99)
Glucose-Capillary: 162 mg/dL — ABNORMAL HIGH (ref 70–99)

## 2024-04-12 LAB — GLUCOSE, CAPILLARY
Glucose-Capillary: 147 mg/dL — ABNORMAL HIGH (ref 70–99)
Glucose-Capillary: 110 mg/dL — ABNORMAL HIGH (ref 70–99)
Glucose-Capillary: 170 mg/dL — ABNORMAL HIGH (ref 70–99)
Glucose-Capillary: 153 mg/dL — ABNORMAL HIGH (ref 70–99)

## 2024-04-13 LAB — GLUCOSE, CAPILLARY
Glucose-Capillary: 132 mg/dL — ABNORMAL HIGH (ref 70–99)
Glucose-Capillary: 134 mg/dL — ABNORMAL HIGH (ref 70–99)
Glucose-Capillary: 144 mg/dL — ABNORMAL HIGH (ref 70–99)
Glucose-Capillary: 152 mg/dL — ABNORMAL HIGH (ref 70–99)
Glucose-Capillary: 203 mg/dL — ABNORMAL HIGH (ref 70–99)

## 2024-04-13 LAB — BASIC METABOLIC PANEL WITH GFR
Anion gap: 10 (ref 5–15)
BUN: 26 mg/dL — ABNORMAL HIGH (ref 6–20)
CO2: 26 mmol/L (ref 22–32)
Calcium: 9.3 mg/dL (ref 8.9–10.3)
Chloride: 103 mmol/L (ref 98–111)
Creatinine, Ser: 1.08 mg/dL (ref 0.61–1.24)
GFR, Estimated: >60 mL/min (ref >60)
Glucose, Bld: 139 mg/dL — ABNORMAL HIGH (ref 70–99)
Potassium: 4 mmol/L (ref 3.5–5.1)
Sodium: 139 mmol/L (ref 135–145)

## 2024-04-13 LAB — CBC
HCT: 35.3 % — ABNORMAL LOW (ref 39.0–52.0)
Hemoglobin: 11 g/dL — ABNORMAL LOW (ref 13.0–17.0)
MCH: 25.3 pg — ABNORMAL LOW (ref 26.0–34.0)
MCHC: 31.2 g/dL (ref 30.0–36.0)
MCV: 81.1 fL (ref 80.0–100.0)
Platelets: 291 K/uL (ref 150–400)
RBC: 4.35 MIL/uL (ref 4.22–5.81)
RDW: 16.4 % — ABNORMAL HIGH (ref 11.5–15.5)
WBC: 5.1 K/uL (ref 4.0–10.5)
nRBC: 0.6 % — ABNORMAL HIGH (ref 0.0–0.2)

## 2024-04-13 MED ORDER — FUROSEMIDE 40 MG PO TABS
60.0000 mg | ORAL_TABLET | Freq: Every day | ORAL | Status: DC
  Administered 2024-04-14: 60 mg via ORAL
  Filled 2024-04-13: qty 1

## 2024-04-14 LAB — GLUCOSE, CAPILLARY
Glucose-Capillary: 140 mg/dL — ABNORMAL HIGH (ref 70–99)
Glucose-Capillary: 139 mg/dL — ABNORMAL HIGH (ref 70–99)
Glucose-Capillary: 144 mg/dL — ABNORMAL HIGH (ref 70–99)
Glucose-Capillary: 154 mg/dL — ABNORMAL HIGH (ref 70–99)

## 2024-04-15 LAB — GLUCOSE, CAPILLARY
Glucose-Capillary: 183 mg/dL — ABNORMAL HIGH (ref 70–99)
Glucose-Capillary: 114 mg/dL — ABNORMAL HIGH (ref 70–99)
Glucose-Capillary: 140 mg/dL — ABNORMAL HIGH (ref 70–99)
Glucose-Capillary: 120 mg/dL — ABNORMAL HIGH (ref 70–99)

## 2024-04-15 MED ORDER — METFORMIN HCL 500 MG PO TABS
1000.0000 mg | ORAL_TABLET | Freq: Two times a day (BID) | ORAL | Status: DC
  Administered 2024-04-15 – 2024-06-05 (×102): 1000 mg via ORAL
  Filled 2024-04-15 (×102): qty 2

## 2024-04-15 MED ORDER — FUROSEMIDE 40 MG PO TABS
80.0000 mg | ORAL_TABLET | Freq: Every day | ORAL | Status: DC
  Administered 2024-04-15 – 2024-04-22 (×8): 80 mg via ORAL
  Filled 2024-04-15 (×8): qty 2

## 2024-04-16 LAB — GLUCOSE, CAPILLARY
Glucose-Capillary: 187 mg/dL — ABNORMAL HIGH (ref 70–99)
Glucose-Capillary: 124 mg/dL — ABNORMAL HIGH (ref 70–99)
Glucose-Capillary: 126 mg/dL — ABNORMAL HIGH (ref 70–99)
Glucose-Capillary: 116 mg/dL — ABNORMAL HIGH (ref 70–99)

## 2024-04-16 LAB — BASIC METABOLIC PANEL WITH GFR
Anion gap: 9 (ref 5–15)
BUN: 32 mg/dL — ABNORMAL HIGH (ref 6–20)
CO2: 28 mmol/L (ref 22–32)
Calcium: 9.4 mg/dL (ref 8.9–10.3)
Chloride: 99 mmol/L (ref 98–111)
Creatinine, Ser: 1.17 mg/dL (ref 0.61–1.24)
GFR, Estimated: >60 mL/min (ref >60)
Glucose, Bld: 132 mg/dL — ABNORMAL HIGH (ref 70–99)
Potassium: 3.9 mmol/L (ref 3.5–5.1)
Sodium: 136 mmol/L (ref 135–145)

## 2024-04-17 LAB — GLUCOSE, CAPILLARY
Glucose-Capillary: 182 mg/dL — ABNORMAL HIGH (ref 70–99)
Glucose-Capillary: 172 mg/dL — ABNORMAL HIGH (ref 70–99)

## 2024-04-18 LAB — GLUCOSE, CAPILLARY
Glucose-Capillary: 111 mg/dL — ABNORMAL HIGH (ref 70–99)
Glucose-Capillary: 132 mg/dL — ABNORMAL HIGH (ref 70–99)
Glucose-Capillary: 124 mg/dL — ABNORMAL HIGH (ref 70–99)
Glucose-Capillary: 169 mg/dL — ABNORMAL HIGH (ref 70–99)

## 2024-04-20 LAB — BASIC METABOLIC PANEL WITH GFR
Anion gap: 12 (ref 5–15)
BUN: 28 mg/dL — ABNORMAL HIGH (ref 6–20)
CO2: 26 mmol/L (ref 22–32)
Calcium: 9.6 mg/dL (ref 8.9–10.3)
Chloride: 97 mmol/L — ABNORMAL LOW (ref 98–111)
Creatinine, Ser: 1.13 mg/dL (ref 0.61–1.24)
GFR, Estimated: >60 mL/min (ref >60)
Glucose, Bld: 99 mg/dL (ref 70–99)
Potassium: 3.6 mmol/L (ref 3.5–5.1)
Sodium: 135 mmol/L (ref 135–145)

## 2024-04-20 LAB — GLUCOSE, CAPILLARY: Glucose-Capillary: 113 mg/dL — ABNORMAL HIGH (ref 70–99)

## 2024-04-21 LAB — GLUCOSE, CAPILLARY
Glucose-Capillary: 104 mg/dL — ABNORMAL HIGH (ref 70–99)
Glucose-Capillary: 173 mg/dL — ABNORMAL HIGH (ref 70–99)
Glucose-Capillary: 128 mg/dL — ABNORMAL HIGH (ref 70–99)

## 2024-04-21 MED ORDER — SPIRONOLACTONE 25 MG PO TABS
25.0000 mg | ORAL_TABLET | Freq: Every day | ORAL | Status: DC
  Administered 2024-04-21 – 2024-04-22 (×2): 25 mg via ORAL
  Filled 2024-04-21 (×2): qty 1

## 2024-04-22 LAB — GLUCOSE, CAPILLARY
Glucose-Capillary: 145 mg/dL — ABNORMAL HIGH (ref 70–99)
Glucose-Capillary: 127 mg/dL — ABNORMAL HIGH (ref 70–99)
Glucose-Capillary: 128 mg/dL — ABNORMAL HIGH (ref 70–99)

## 2024-04-23 LAB — BASIC METABOLIC PANEL WITH GFR
Anion gap: 9 (ref 5–15)
BUN: 39 mg/dL — ABNORMAL HIGH (ref 6–20)
CO2: 27 mmol/L (ref 22–32)
Calcium: 8.9 mg/dL (ref 8.9–10.3)
Chloride: 100 mmol/L (ref 98–111)
Creatinine, Ser: 1.4 mg/dL — ABNORMAL HIGH (ref 0.61–1.24)
GFR, Estimated: 59 mL/min — ABNORMAL LOW (ref >60)
Glucose, Bld: 148 mg/dL — ABNORMAL HIGH (ref 70–99)
Potassium: 3.8 mmol/L (ref 3.5–5.1)
Sodium: 136 mmol/L (ref 135–145)

## 2024-04-23 LAB — HEMOGLOBIN A1C
Hgb A1c MFr Bld: 7.7 % — ABNORMAL HIGH (ref 4.8–5.6)
Mean Plasma Glucose: 174.29 mg/dL

## 2024-04-23 LAB — GLUCOSE, CAPILLARY: Glucose-Capillary: 135 mg/dL — ABNORMAL HIGH (ref 70–99)

## 2024-04-23 MED ORDER — SPIRONOLACTONE 25 MG PO TABS: 25.0000 mg | ORAL_TABLET | Freq: Every day | ORAL | Status: DC

## 2024-04-23 MED ORDER — RISPERIDONE 1 MG PO TABS
1.0000 mg | ORAL_TABLET | Freq: Two times a day (BID) | ORAL | Status: DC
  Administered 2024-04-23 – 2024-05-06 (×27): 1 mg via ORAL
  Filled 2024-04-23 (×28): qty 1

## 2024-04-23 MED ORDER — CARVEDILOL 3.125 MG PO TABS
3.1250 mg | ORAL_TABLET | Freq: Two times a day (BID) | ORAL | Status: DC
  Administered 2024-04-23 – 2024-04-24 (×4): 3.125 mg via ORAL
  Filled 2024-04-23 (×4): qty 1

## 2024-04-23 MED ORDER — FUROSEMIDE 40 MG PO TABS: 40.0000 mg | ORAL_TABLET | Freq: Every day | ORAL | Status: DC

## 2024-04-23 MED ORDER — EMPAGLIFLOZIN 10 MG PO TABS: 10.0000 mg | ORAL_TABLET | Freq: Every day | ORAL | Status: DC

## 2024-04-24 LAB — BASIC METABOLIC PANEL WITH GFR
Anion gap: 9 (ref 5–15)
BUN: 24 mg/dL — ABNORMAL HIGH (ref 6–20)
CO2: 25 mmol/L (ref 22–32)
Calcium: 9.2 mg/dL (ref 8.9–10.3)
Chloride: 101 mmol/L (ref 98–111)
Creatinine, Ser: 1.04 mg/dL (ref 0.61–1.24)
GFR, Estimated: >60 mL/min (ref >60)
Glucose, Bld: 163 mg/dL — ABNORMAL HIGH (ref 70–99)
Potassium: 4.2 mmol/L (ref 3.5–5.1)
Sodium: 135 mmol/L (ref 135–145)

## 2024-04-24 LAB — GLUCOSE, CAPILLARY
Glucose-Capillary: 173 mg/dL — ABNORMAL HIGH (ref 70–99)
Glucose-Capillary: 202 mg/dL — ABNORMAL HIGH (ref 70–99)
Glucose-Capillary: 141 mg/dL — ABNORMAL HIGH (ref 70–99)
Glucose-Capillary: 157 mg/dL — ABNORMAL HIGH (ref 70–99)

## 2024-04-25 LAB — GLUCOSE, CAPILLARY
Glucose-Capillary: 168 mg/dL — ABNORMAL HIGH (ref 70–99)
Glucose-Capillary: 112 mg/dL — ABNORMAL HIGH (ref 70–99)
Glucose-Capillary: 169 mg/dL — ABNORMAL HIGH (ref 70–99)
Glucose-Capillary: 138 mg/dL — ABNORMAL HIGH (ref 70–99)

## 2024-04-25 MED ORDER — FUROSEMIDE 40 MG PO TABS
60.0000 mg | ORAL_TABLET | Freq: Every day | ORAL | Status: DC
  Administered 2024-04-25 – 2024-04-26 (×2): 60 mg via ORAL
  Filled 2024-04-25 (×3): qty 1

## 2024-04-25 MED ORDER — ISOSORBIDE MONONITRATE ER 30 MG PO TB24
30.0000 mg | ORAL_TABLET | Freq: Every day | ORAL | Status: DC
  Administered 2024-04-25 – 2024-04-26 (×2): 30 mg via ORAL
  Filled 2024-04-25 (×3): qty 1

## 2024-04-25 MED ORDER — CARVEDILOL 6.25 MG PO TABS
6.2500 mg | ORAL_TABLET | Freq: Two times a day (BID) | ORAL | Status: DC
  Administered 2024-04-25 – 2024-04-26 (×4): 6.25 mg via ORAL
  Filled 2024-04-25 (×5): qty 1

## 2024-04-26 LAB — GLUCOSE, CAPILLARY
Glucose-Capillary: 175 mg/dL — ABNORMAL HIGH (ref 70–99)
Glucose-Capillary: 140 mg/dL — ABNORMAL HIGH (ref 70–99)
Glucose-Capillary: 174 mg/dL — ABNORMAL HIGH (ref 70–99)
Glucose-Capillary: 97 mg/dL (ref 70–99)
Glucose-Capillary: 158 mg/dL — ABNORMAL HIGH (ref 70–99)

## 2024-04-27 LAB — GLUCOSE, CAPILLARY
Glucose-Capillary: 98 mg/dL (ref 70–99)
Glucose-Capillary: 107 mg/dL — ABNORMAL HIGH (ref 70–99)
Glucose-Capillary: 108 mg/dL — ABNORMAL HIGH (ref 70–99)
Glucose-Capillary: 105 mg/dL — ABNORMAL HIGH (ref 70–99)

## 2024-04-27 MED ORDER — CARVEDILOL 3.125 MG PO TABS
3.1250 mg | ORAL_TABLET | Freq: Two times a day (BID) | ORAL | Status: DC
  Administered 2024-04-27 – 2024-05-25 (×51): 3.125 mg via ORAL
  Filled 2024-04-27 (×56): qty 1

## 2024-04-27 MED ORDER — ISOSORBIDE MONONITRATE ER 30 MG PO TB24
30.0000 mg | ORAL_TABLET | Freq: Every day | ORAL | Status: DC
  Administered 2024-04-28 – 2024-04-29 (×2): 30 mg via ORAL
  Filled 2024-04-27 (×2): qty 1

## 2024-04-27 MED ORDER — FUROSEMIDE 40 MG PO TABS
60.0000 mg | ORAL_TABLET | Freq: Every day | ORAL | Status: DC
  Administered 2024-04-28 – 2024-04-29 (×2): 60 mg via ORAL
  Filled 2024-04-27 (×2): qty 1

## 2024-04-28 LAB — GLUCOSE, CAPILLARY
Glucose-Capillary: 161 mg/dL — ABNORMAL HIGH (ref 70–99)
Glucose-Capillary: 200 mg/dL — ABNORMAL HIGH (ref 70–99)

## 2024-04-29 LAB — CBC WITH DIFFERENTIAL/PLATELET
Abs Immature Granulocytes: 0.02 K/uL (ref 0.00–0.07)
Basophils Absolute: 0 K/uL (ref 0.0–0.1)
Basophils Relative: 1 %
Eosinophils Absolute: 0.1 K/uL (ref 0.0–0.5)
Eosinophils Relative: 2 %
HCT: 36.6 % — ABNORMAL LOW (ref 39.0–52.0)
Hemoglobin: 11.3 g/dL — ABNORMAL LOW (ref 13.0–17.0)
Immature Granulocytes: 1 %
Lymphocytes Relative: 37 %
Lymphs Abs: 1.5 K/uL (ref 0.7–4.0)
MCH: 24.7 pg — ABNORMAL LOW (ref 26.0–34.0)
MCHC: 30.9 g/dL (ref 30.0–36.0)
MCV: 80.1 fL (ref 80.0–100.0)
Monocytes Absolute: 0.5 K/uL (ref 0.1–1.0)
Monocytes Relative: 13 %
Neutro Abs: 2 K/uL (ref 1.7–7.7)
Neutrophils Relative %: 46 %
Platelets: 297 K/uL (ref 150–400)
RBC: 4.57 MIL/uL (ref 4.22–5.81)
RDW: 16.5 % — ABNORMAL HIGH (ref 11.5–15.5)
WBC: 4.2 K/uL (ref 4.0–10.5)
nRBC: 0.5 % — ABNORMAL HIGH (ref 0.0–0.2)

## 2024-04-29 LAB — BASIC METABOLIC PANEL WITH GFR
Anion gap: 7 (ref 5–15)
BUN: 26 mg/dL — ABNORMAL HIGH (ref 6–20)
CO2: 28 mmol/L (ref 22–32)
Calcium: 9.5 mg/dL (ref 8.9–10.3)
Chloride: 102 mmol/L (ref 98–111)
Creatinine, Ser: 1.08 mg/dL (ref 0.61–1.24)
GFR, Estimated: >60 mL/min (ref >60)
Glucose, Bld: 124 mg/dL — ABNORMAL HIGH (ref 70–99)
Potassium: 4.2 mmol/L (ref 3.5–5.1)
Sodium: 137 mmol/L (ref 135–145)

## 2024-04-29 LAB — MAGNESIUM: Magnesium: 1.9 mg/dL (ref 1.7–2.4)

## 2024-04-29 LAB — GLUCOSE, CAPILLARY: Glucose-Capillary: 166 mg/dL — ABNORMAL HIGH (ref 70–99)

## 2024-04-30 LAB — GLUCOSE, CAPILLARY
Glucose-Capillary: 178 mg/dL — ABNORMAL HIGH (ref 70–99)
Glucose-Capillary: 148 mg/dL — ABNORMAL HIGH (ref 70–99)
Glucose-Capillary: 112 mg/dL — ABNORMAL HIGH (ref 70–99)
Glucose-Capillary: 140 mg/dL — ABNORMAL HIGH (ref 70–99)

## 2024-04-30 MED ORDER — ISOSORBIDE MONONITRATE ER 60 MG PO TB24
60.0000 mg | ORAL_TABLET | Freq: Every day | ORAL | Status: DC
  Administered 2024-04-30 – 2024-06-05 (×37): 60 mg via ORAL
  Filled 2024-04-30 (×37): qty 1

## 2024-04-30 MED ORDER — FUROSEMIDE 40 MG PO TABS
80.0000 mg | ORAL_TABLET | Freq: Every day | ORAL | Status: DC
  Administered 2024-04-30 – 2024-05-18 (×19): 80 mg via ORAL
  Filled 2024-04-30 (×19): qty 2

## 2024-05-01 LAB — GLUCOSE, CAPILLARY
Glucose-Capillary: 126 mg/dL — ABNORMAL HIGH (ref 70–99)
Glucose-Capillary: 122 mg/dL — ABNORMAL HIGH (ref 70–99)
Glucose-Capillary: 151 mg/dL — ABNORMAL HIGH (ref 70–99)
Glucose-Capillary: 113 mg/dL — ABNORMAL HIGH (ref 70–99)

## 2024-05-01 MED ORDER — HYDRALAZINE HCL 25 MG PO TABS
25.0000 mg | ORAL_TABLET | Freq: Three times a day (TID) | ORAL | Status: DC
  Administered 2024-05-01 – 2024-06-05 (×94): 25 mg via ORAL
  Filled 2024-05-01 (×99): qty 1

## 2024-05-02 LAB — GLUCOSE, CAPILLARY
Glucose-Capillary: 158 mg/dL — ABNORMAL HIGH (ref 70–99)
Glucose-Capillary: 124 mg/dL — ABNORMAL HIGH (ref 70–99)
Glucose-Capillary: 151 mg/dL — ABNORMAL HIGH (ref 70–99)
Glucose-Capillary: 148 mg/dL — ABNORMAL HIGH (ref 70–99)

## 2024-05-03 LAB — GLUCOSE, CAPILLARY
Glucose-Capillary: 117 mg/dL — ABNORMAL HIGH (ref 70–99)
Glucose-Capillary: 165 mg/dL — ABNORMAL HIGH (ref 70–99)
Glucose-Capillary: 150 mg/dL — ABNORMAL HIGH (ref 70–99)
Glucose-Capillary: 124 mg/dL — ABNORMAL HIGH (ref 70–99)

## 2024-05-03 MED ORDER — MUSCLE RUB 10-15 % EX CREA
TOPICAL_CREAM | CUTANEOUS | Status: DC | PRN
  Administered 2024-05-25 – 2024-05-28 (×2): 1 via TOPICAL
  Filled 2024-05-03 (×2): qty 85

## 2024-05-04 LAB — GLUCOSE, CAPILLARY
Glucose-Capillary: 183 mg/dL — ABNORMAL HIGH (ref 70–99)
Glucose-Capillary: 129 mg/dL — ABNORMAL HIGH (ref 70–99)
Glucose-Capillary: 167 mg/dL — ABNORMAL HIGH (ref 70–99)
Glucose-Capillary: 116 mg/dL — ABNORMAL HIGH (ref 70–99)

## 2024-05-05 LAB — GLUCOSE, CAPILLARY
Glucose-Capillary: 132 mg/dL — ABNORMAL HIGH (ref 70–99)
Glucose-Capillary: 120 mg/dL — ABNORMAL HIGH (ref 70–99)
Glucose-Capillary: 126 mg/dL — ABNORMAL HIGH (ref 70–99)
Glucose-Capillary: 182 mg/dL — ABNORMAL HIGH (ref 70–99)

## 2024-05-06 MED ORDER — RISPERIDONE 0.5 MG PO TABS
0.5000 mg | ORAL_TABLET | Freq: Every day | ORAL | Status: DC
Start: 2024-05-07 — End: 2024-05-07
  Administered 2024-05-07: 0.5 mg via ORAL
  Filled 2024-05-06: qty 1

## 2024-05-06 MED ORDER — RISPERIDONE 1 MG PO TABS: 1.0000 mg | ORAL_TABLET | Freq: Every day | ORAL | Status: DC

## 2024-05-06 NOTE — Consult Note (Signed)
 [facility] Consult Evaluation  Service Date: May 06, 2024 LOS:  LOS: 194 days    Primary Psychiatric Diagnoses  Major Neurocognitive Disorder  Assessment  Colton Lee is a 58 y.o. male admitted medically for 10/24/2023  8:36 PM for altered mental status. He carries no psychiatric diagnoses and has an undetermined past medical. Psychiatry was consulted for medication adjustments.  Patient with a history of chronic psychotic illness, currently lacking decisional capacity, appears to be responding well to scheduled Zyprexa . On interview, the patient remains mildly perseverative on the idea that his room has bad vibes, but reports this preoccupation has lessened since starting Zyprexa . He appears more redirectable and does not exhibit signs of acute psychosis or internal preoccupation.  No PRN agitation medications have been required recently, and the patient has remained behaviorally stable. TOC continues to work on identifying an appropriate discharge placement.  4/22: Patient continues to do well with current psychotropic regimen. No changes today. If no further issues arise, patient will be re-assessed on Friday 4/25.   04/23/2024 Patient seen in room this morning on my approach. Psychiatry was reconsulted to see Mr. Celina Colla due to concerns for increased appetite and weight gain since being started on Zyprexa . He is oriented to time and place but slightly disoriented to situation. He reports that he is feeling fine but he has been eating a lot and he describes his appetite as, voracious.  Next steps with agitation in order would include:  1. Verbal de-escalation (active listening, calm tone, validate, problem-solve, reassurance, set boundaries/expectations) and environmental modification (loud noises, bright lights) and assess bowel/bladder, hunger/thirst, pain, re-direct patient. 2. Pharmacological interventions with prn Zyprexa  PO (or IM if necessary) 3. Physical restraints  as a last resort.  04/24/2024: The patient was seen in his room and the chart was reviewed and the case was discussed.  The patient remains fairly cooperative.  He was oriented to time and place but continues to be disorganized.  He rambled about being in a different room and  the television was [employer] and now it is  [employer].  I like my stuff to be [employer].  However he did report that he slept better and does not feel upset but is concerned about the long length of stay.  He is very vague about where he lives but I see no additional change in his mental status compared to his prior consultation.  The plan is to monitor him from a distance and be available as indicated .  He is basically awaiting placement.  04/25/2024: The patient was seen briefly and reevaluated.  There is no significant change since yesterday.  He remains cooperative, compliant and mildly disorganized in his speech.  His main request is that he would like to have a different room because there is too much sunlight from the window in his room and he feels that it is constricting.  He is awaiting placement.  He denies SI/HI/AVH.  Psychiatry to sign off and be available from a distance as indicated.  05/06/2024: The client was calmly watching television in his room prior to the assessment.  Consult placed to readjust medications as it appears to be decreasing his interactions.  The client stated, I'm sleeping too much.  Denied depression, hallucinations, and anxiety.  His appetite is fair related to his complaint of feeling bloated.  No other issues, medications adjusted.  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  -Continue Ativan  prn. -- Primary team encouraged to administer PRN as ordered for agitation  ## Medical Decision Making Capacity:  Patient does not have capacity for medical decision making at this time due to severe memory impairment. Pt has no next of kin.  Given that this is very likely irreversible secondary to Korsakoff syndrome and in context of ruling out reversible medical, neurologic, and psychiatric causes we recommend involving APS for legal guardianship process due to concern for lack of competency.   ## Further Work-up:  --none -- most recent EKG on 10/24/23 had QtC of 481  ## Disposition:  -- Recommend involving APS for legal guardianship process due to concern for lack of competency. Remains unsafe to discharge. In the event patient attempts please obtain IVC.  ## Behavioral / Environmental:  --  Delirium Precautions: Delirium Interventions for Nursing and Staff: - RN to open blinds every AM. - To Bedside: Glasses, hearing aide, and pt's own shoes. Make available to patients. when possible and encourage use. - Encourage po fluids when appropriate, keep fluids within reach. - OOB to chair with meals. - Passive ROM exercises to all extremities with AM & PM care. - RN to assess orientation to person, time and place QAM and PRN. - Recommend extended visitation hours with familiar family/friends as feasible. - Staff to minimize disturbances at night. Turn off television when pt asleep or when not in use.    ## Safety and Observation Level:  - Based on my clinical evaluation, I  estimate the patient to be at low risk of self harm in the current setting - At this time, we recommend a low level of observation. This decision is based on my review of the chart including patient's history and current presentation, interview of the patient, mental status examination, and consideration of suicide risk including evaluating suicidal ideation, plan, intent, suicidal or self-harm behaviors, risk factors, and protective factors. This judgment is based on our ability to directly address suicide risk, implement suicide prevention strategies and develop a safety plan while the patient is in the clinical setting. Please contact our team if there is a concern that risk level has changed.  Suicide risk assessment  Patient has following modifiable risk factors for suicide: None  Patient has following non-modifiable or demographic risk factors for suicide: male gender  Patient has the following protective factors against suicide: None Identified due to altered mental status We will sign off at this time.  Psychiatric and Social History   Relevant Aspects of Hospital Course:  Admitted on 10/24/2023 for altered mental status.  Full medical workup including infectious was negative.  Neurologic workup negative.  Psych consulted.   Patient Report:  Patient doing well today, in a pleasant mood. No acute concerns. NO SI/HI/AVH. No somatic concerns or reported side effects to current medications.  Psych ROS:  Depression: Unable to assess Anxiety:  Unable to assess Mania (lifetime and current): Unable to assess Psychosis: (lifetime and current): Unable to assess  Collateral information:  Continued lack of collateral contacts.  Per Dr. Alecia Ames who spoke with someone who knew the patient (since 2009), patient has long history of delusions including that his parents own [place]  and disorganized speech. Probable heavy drinking history.  Reports that in the past patient has been  able to ask security for cigarette.  Psychiatric History:  Information collected from chart, patient unable to answer questions  Prev Dx/Sx: None Current Psych Provider: None Home Meds (current): None Previous Med Trials: None Therapy: None  Prior ECT: None Prior Psych Hospitalization: None Prior Self Harm: None Prior Violence: None  Family Psych History: None Family Hx suicide: None  Social History: Patient unable to provide answers due to altered mental status Living Situation: Experiencing homelessness Access to weapons: Unable to assess  Substance History Tobacco use: Unable to assess Alcohol use: Suggestive alcohol use history per discussion with other providers Drug use: unable to assess, negative uds   Exam Findings   Psychiatric Specialty Exam:    Mental Status Exam: General Appearance: Casual  Orientation: self and place  Memory:  Fair  Concentration:  Fair  Recall:  Fair  Attention  Fair  Eye Contact:  Good  Speech:  clear, regular rate, rhythm, and tone   Language:  Good  Volume:  Normal  Mood: denied depression and anxiety  Affect: Fairly appropriate.  No agitation noted.  Thought Process:  logical  Thought Content:  WDL  Suicidal Thoughts:  No  Homicidal Thoughts:  No  Judgement:  Impaired  Insight:  Lacking  Psychomotor Activity: Normal  Akathisia:  No  Fund of Knowledge:  Poor      Assets:  relative physical health   Cognition:  Poor  ADL's:  Clearly impaired  AIMS (if indicated):   No tardive dyskinesia noted on brief exam.      Physical Exam: Vital signs:  Temp:  [97.6 F (36.4 C)-98 F (36.7 C)] 97.6 F (36.4 C) (06/15 0757) Pulse Rate:  [78-79] 78 (06/15 0757) Resp:  [18] 18 (06/15 0757) BP: (141-143)/(86-101) 141/86 (06/15 0757)  Physical Exam Vitals and nursing note reviewed.  HENT:     Head: Normocephalic and atraumatic.  Pulmonary:     Effort: Pulmonary effort is normal.   Neurological:     General: No focal  deficit present.     Mental Status: He is alert.     Blood pressure (!) 141/86, pulse 78, temperature 97.6 F (36.4 C), temperature source Axillary, resp. rate 18, height 6' 5 (1.956 m), weight (!) 162.2 kg, SpO2 96%. Body mass index is 42.39 kg/m.   Other History   These have been pulled in through the EMR, reviewed, and updated if appropriate.   Family History:  The patient's family history is not on file.  Medical History: History reviewed. No pertinent past medical history.  Surgical History: History reviewed. No pertinent surgical history.  Medications:   Current Facility-Administered Medications:    acetaminophen  (TYLENOL ) tablet 650 mg, 650 mg, Oral, Q6H PRN, 650 mg at 05/04/24 1708 **OR** acetaminophen  (TYLENOL ) suppository 650 mg, 650 mg, Rectal, Q6H PRN, Lovell Rubenstein, NP   apixaban  (ELIQUIS ) tablet 2.5 mg, 2.5 mg, Oral, BID, Pham, Minh Q, RPH-CPP, 2.5 mg at 05/06/24 0914   carvedilol  (COREG ) tablet 3.125 mg, 3.125 mg, Oral, BID WC, Singh, Prashant K, MD, 3.125  mg at 05/06/24 0913   clonazePAM  (KLONOPIN ) disintegrating tablet 0.5 mg, 0.5 mg, Oral, BID, Singh, Prashant K, MD, 0.5 mg at 05/06/24 0914   feeding supplement (ENSURE ENLIVE / ENSURE PLUS) liquid 237 mL, 237 mL, Oral, BID BM, Alva, Rakesh V, MD, 237 mL at 05/06/24 0915   folic acid  (FOLVITE ) tablet 1 mg, 1 mg, Oral, Daily, Singh, Prashant K, MD, 1 mg at 05/06/24 0915   furosemide  (LASIX ) tablet 80 mg, 80 mg, Oral, Daily, Singh, Prashant K, MD, 80 mg at 05/06/24 0914   hydrALAZINE  (APRESOLINE ) tablet 25 mg, 25 mg, Oral, Q8H, Singh, Prashant K, MD, 25 mg at 05/06/24 9629   insulin  aspart (novoLOG ) injection 0-5 Units, 0-5 Units, Subcutaneous, QHS, Singh, Prashant K, MD, 2 Units at 04/08/24 2126   isosorbide  mononitrate (IMDUR ) 24 hr tablet 60 mg, 60 mg, Oral, Daily, Singh, Prashant K, MD, 60 mg at 05/06/24 0914   lubriderm seriously sensitive lotion, , Topical, Daily, Krishnan, Gokul, MD, Given at 05/06/24  5284   magic mouthwash w/lidocaine , 10 mL, Oral, QID PRN, Ghimire, Shanker M, MD, 10 mL at 01/15/24 1946   menthol -cetylpyridinium (CEPACOL) lozenge 3 mg, 1 lozenge, Oral, PRN, Crosley, Debby, MD, 3 mg at 05/05/24 1220   metFORMIN  (GLUCOPHAGE ) tablet 1,000 mg, 1,000 mg, Oral, BID WC, Ghimire, Estil Heman, MD, 1,000 mg at 05/06/24 0915   mometasone -formoterol  (DULERA ) 100-5 MCG/ACT inhaler 2 puff, 2 puff, Inhalation, BID, Krishnan, Gokul, MD, 2 puff at 05/06/24 0912   multivitamin with minerals tablet 1 tablet, 1 tablet, Oral, Daily, Singh, Prashant K, MD, 1 tablet at 05/06/24 0914   Muscle Rub CREA, , Topical, PRN, Elgergawy, Dawood S, MD   naloxone  (NARCAN ) injection 0.4 mg, 0.4 mg, Intravenous, PRN, Haydee Lipa, MD, 0.4 mg at 10/25/23 1615   nicotine  polacrilex (NICORETTE ) gum 2 mg, 2 mg, Oral, PRN, Howerter, Justin B, DO   OLANZapine  (ZYPREXA ) tablet 10 mg, 10 mg, Oral, Q8H PRN, 10 mg at 05/05/24 2110 **OR** OLANZapine  (ZYPREXA ) injection 10 mg, 10 mg, Intramuscular, Q8H PRN, McCarty, Artie, MD   Oral care mouth rinse, 15 mL, Mouth Rinse, PRN, Ghimire, Estil Heman, MD, 15 mL at 02/04/24 2117   pantoprazole  (PROTONIX ) EC tablet 40 mg, 40 mg, Oral, Daily, Singh, Prashant K, MD, 40 mg at 05/06/24 0914   [START ON 05/07/2024] risperiDONE  (RISPERDAL ) tablet 0.5 mg, 0.5 mg, Oral, Daily, Alissah Redmon Y, NP   [START ON 05/07/2024] risperiDONE  (RISPERDAL ) tablet 1 mg, 1 mg, Oral, QHS, Marysa Wessner Y, NP   rosuvastatin  (CRESTOR ) tablet 20 mg, 20 mg, Oral, Daily, Jackolyn Masker, MD, 20 mg at 05/06/24 0915   senna-docusate (Senokot-S) tablet 2 tablet, 2 tablet, Oral, BID, Jackolyn Masker, MD, 2 tablet at 05/06/24 0915   sodium chloride  flush (NS) 0.9 % injection 10-40 mL, 10-40 mL, Intracatheter, PRN, Singh, Prashant K, MD, 10 mL at 04/24/24 1324   thiamine  (VITAMIN B1) tablet 100 mg, 100 mg, Oral, Daily, Elgergawy, Dawood S, MD, 100 mg at 05/06/24 0915  Allergies: No Known Allergies  Review of Systems   All other systems reviewed and are negative.  Roslynn Coombes

## 2024-05-07 LAB — GLUCOSE, CAPILLARY
Glucose-Capillary: 209 mg/dL — ABNORMAL HIGH (ref 70–99)
Glucose-Capillary: 126 mg/dL — ABNORMAL HIGH (ref 70–99)
Glucose-Capillary: 155 mg/dL — ABNORMAL HIGH (ref 70–99)
Glucose-Capillary: 117 mg/dL — ABNORMAL HIGH (ref 70–99)
Glucose-Capillary: 129 mg/dL — ABNORMAL HIGH (ref 70–99)
Glucose-Capillary: 135 mg/dL — ABNORMAL HIGH (ref 70–99)
Glucose-Capillary: 137 mg/dL — ABNORMAL HIGH (ref 70–99)
Glucose-Capillary: 163 mg/dL — ABNORMAL HIGH (ref 70–99)
Glucose-Capillary: 136 mg/dL — ABNORMAL HIGH (ref 70–99)

## 2024-05-07 MED ORDER — RISPERIDONE 1 MG PO TABS
1.5000 mg | ORAL_TABLET | Freq: Every day | ORAL | Status: DC
  Administered 2024-05-07 – 2024-06-04 (×29): 1.5 mg via ORAL
  Filled 2024-05-07 (×31): qty 1

## 2024-05-07 MED ORDER — OLANZAPINE 10 MG IM SOLR: 5.0000 mg | Freq: Three times a day (TID) | INTRAMUSCULAR | Status: DC | PRN

## 2024-05-07 MED ORDER — OLANZAPINE 5 MG PO TABS
5.0000 mg | ORAL_TABLET | Freq: Three times a day (TID) | ORAL | Status: DC | PRN
  Administered 2024-05-26: 5 mg via ORAL
  Filled 2024-05-07: qty 1

## 2024-05-08 LAB — CBC
HCT: 38.2 % — ABNORMAL LOW (ref 39.0–52.0)
Hemoglobin: 11.4 g/dL — ABNORMAL LOW (ref 13.0–17.0)
MCH: 24.2 pg — ABNORMAL LOW (ref 26.0–34.0)
MCHC: 29.8 g/dL — ABNORMAL LOW (ref 30.0–36.0)
MCV: 81.1 fL (ref 80.0–100.0)
Platelets: 290 K/uL (ref 150–400)
RBC: 4.71 MIL/uL (ref 4.22–5.81)
RDW: 16.5 % — ABNORMAL HIGH (ref 11.5–15.5)
WBC: 5.2 K/uL (ref 4.0–10.5)
nRBC: 0.4 % — ABNORMAL HIGH (ref 0.0–0.2)

## 2024-05-08 LAB — BASIC METABOLIC PANEL WITH GFR
Anion gap: 11 (ref 5–15)
BUN: 26 mg/dL — ABNORMAL HIGH (ref 6–20)
CO2: 27 mmol/L (ref 22–32)
Calcium: 9.3 mg/dL (ref 8.9–10.3)
Chloride: 99 mmol/L (ref 98–111)
Creatinine, Ser: 1.08 mg/dL (ref 0.61–1.24)
GFR, Estimated: >60 mL/min (ref >60)
Glucose, Bld: 187 mg/dL — ABNORMAL HIGH (ref 70–99)
Potassium: 4 mmol/L (ref 3.5–5.1)
Sodium: 137 mmol/L (ref 135–145)

## 2024-05-08 LAB — MAGNESIUM: Magnesium: 2 mg/dL (ref 1.7–2.4)

## 2024-05-09 LAB — GLUCOSE, CAPILLARY
Glucose-Capillary: 145 mg/dL — ABNORMAL HIGH (ref 70–99)
Glucose-Capillary: 156 mg/dL — ABNORMAL HIGH (ref 70–99)
Glucose-Capillary: 183 mg/dL — ABNORMAL HIGH (ref 70–99)
Glucose-Capillary: 147 mg/dL — ABNORMAL HIGH (ref 70–99)
Glucose-Capillary: 156 mg/dL — ABNORMAL HIGH (ref 70–99)
Glucose-Capillary: 161 mg/dL — ABNORMAL HIGH (ref 70–99)

## 2024-05-10 LAB — GLUCOSE, CAPILLARY
Glucose-Capillary: 109 mg/dL — ABNORMAL HIGH (ref 70–99)
Glucose-Capillary: 195 mg/dL — ABNORMAL HIGH (ref 70–99)
Glucose-Capillary: 145 mg/dL — ABNORMAL HIGH (ref 70–99)
Glucose-Capillary: 148 mg/dL — ABNORMAL HIGH (ref 70–99)

## 2024-05-11 LAB — GLUCOSE, CAPILLARY
Glucose-Capillary: 132 mg/dL — ABNORMAL HIGH (ref 70–99)
Glucose-Capillary: 133 mg/dL — ABNORMAL HIGH (ref 70–99)
Glucose-Capillary: 109 mg/dL — ABNORMAL HIGH (ref 70–99)
Glucose-Capillary: 125 mg/dL — ABNORMAL HIGH (ref 70–99)

## 2024-05-14 LAB — GLUCOSE, CAPILLARY
Glucose-Capillary: 147 mg/dL — ABNORMAL HIGH (ref 70–99)
Glucose-Capillary: 134 mg/dL — ABNORMAL HIGH (ref 70–99)
Glucose-Capillary: 180 mg/dL — ABNORMAL HIGH (ref 70–99)
Glucose-Capillary: 181 mg/dL — ABNORMAL HIGH (ref 70–99)
Glucose-Capillary: 141 mg/dL — ABNORMAL HIGH (ref 70–99)
Glucose-Capillary: 208 mg/dL — ABNORMAL HIGH (ref 70–99)
Glucose-Capillary: 124 mg/dL — ABNORMAL HIGH (ref 70–99)
Glucose-Capillary: 130 mg/dL — ABNORMAL HIGH (ref 70–99)
Glucose-Capillary: 154 mg/dL — ABNORMAL HIGH (ref 70–99)
Glucose-Capillary: 132 mg/dL — ABNORMAL HIGH (ref 70–99)
Glucose-Capillary: 123 mg/dL — ABNORMAL HIGH (ref 70–99)
Glucose-Capillary: 159 mg/dL — ABNORMAL HIGH (ref 70–99)
Glucose-Capillary: 155 mg/dL — ABNORMAL HIGH (ref 70–99)

## 2024-05-15 LAB — GLUCOSE, CAPILLARY
Glucose-Capillary: 128 mg/dL — ABNORMAL HIGH (ref 70–99)
Glucose-Capillary: 142 mg/dL — ABNORMAL HIGH (ref 70–99)
Glucose-Capillary: 133 mg/dL — ABNORMAL HIGH (ref 70–99)
Glucose-Capillary: 140 mg/dL — ABNORMAL HIGH (ref 70–99)

## 2024-05-15 MED ORDER — LORATADINE 10 MG PO TABS
10.0000 mg | ORAL_TABLET | Freq: Every day | ORAL | Status: DC | PRN
  Administered 2024-05-15: 10 mg via ORAL
  Filled 2024-05-15: qty 1

## 2024-05-16 LAB — GLUCOSE, CAPILLARY
Glucose-Capillary: 221 mg/dL — ABNORMAL HIGH (ref 70–99)
Glucose-Capillary: 107 mg/dL — ABNORMAL HIGH (ref 70–99)
Glucose-Capillary: 166 mg/dL — ABNORMAL HIGH (ref 70–99)

## 2024-05-17 LAB — GLUCOSE, CAPILLARY
Glucose-Capillary: 139 mg/dL — ABNORMAL HIGH (ref 70–99)
Glucose-Capillary: 135 mg/dL — ABNORMAL HIGH (ref 70–99)
Glucose-Capillary: 113 mg/dL — ABNORMAL HIGH (ref 70–99)
Glucose-Capillary: 122 mg/dL — ABNORMAL HIGH (ref 70–99)
Glucose-Capillary: 118 mg/dL — ABNORMAL HIGH (ref 70–99)

## 2024-05-17 MED ORDER — ENSURE PLUS HIGH PROTEIN PO LIQD
237.0000 mL | Freq: Two times a day (BID) | ORAL | Status: DC
  Administered 2024-05-18 – 2024-06-05 (×31): 237 mL via ORAL

## 2024-05-18 LAB — BASIC METABOLIC PANEL WITH GFR
Anion gap: 11 (ref 5–15)
BUN: 25 mg/dL — ABNORMAL HIGH (ref 6–20)
CO2: 27 mmol/L (ref 22–32)
Calcium: 9.4 mg/dL (ref 8.9–10.3)
Chloride: 98 mmol/L (ref 98–111)
Creatinine, Ser: 1.27 mg/dL — ABNORMAL HIGH (ref 0.61–1.24)
GFR, Estimated: >60 mL/min (ref >60)
Glucose, Bld: 129 mg/dL — ABNORMAL HIGH (ref 70–99)
Potassium: 3.8 mmol/L (ref 3.5–5.1)
Sodium: 136 mmol/L (ref 135–145)

## 2024-05-18 LAB — GLUCOSE, CAPILLARY
Glucose-Capillary: 152 mg/dL — ABNORMAL HIGH (ref 70–99)
Glucose-Capillary: 158 mg/dL — ABNORMAL HIGH (ref 70–99)
Glucose-Capillary: 225 mg/dL — ABNORMAL HIGH (ref 70–99)
Glucose-Capillary: 172 mg/dL — ABNORMAL HIGH (ref 70–99)

## 2024-05-18 MED ORDER — FUROSEMIDE 40 MG PO TABS
40.0000 mg | ORAL_TABLET | Freq: Every day | ORAL | Status: DC
  Administered 2024-05-19 – 2024-06-05 (×18): 40 mg via ORAL
  Filled 2024-05-18 (×18): qty 1

## 2024-05-21 LAB — BASIC METABOLIC PANEL WITH GFR
Anion gap: 12 (ref 5–15)
BUN: 19 mg/dL (ref 6–20)
CO2: 26 mmol/L (ref 22–32)
Calcium: 9.5 mg/dL (ref 8.9–10.3)
Chloride: 100 mmol/L (ref 98–111)
Creatinine, Ser: 1.02 mg/dL (ref 0.61–1.24)
GFR, Estimated: >60 mL/min (ref >60)
Glucose, Bld: 164 mg/dL — ABNORMAL HIGH (ref 70–99)
Potassium: 4 mmol/L (ref 3.5–5.1)
Sodium: 138 mmol/L (ref 135–145)

## 2024-05-21 LAB — GLUCOSE, CAPILLARY
Glucose-Capillary: 140 mg/dL — ABNORMAL HIGH (ref 70–99)
Glucose-Capillary: 100 mg/dL — ABNORMAL HIGH (ref 70–99)
Glucose-Capillary: 96 mg/dL (ref 70–99)
Glucose-Capillary: 171 mg/dL — ABNORMAL HIGH (ref 70–99)
Glucose-Capillary: 94 mg/dL (ref 70–99)
Glucose-Capillary: 121 mg/dL — ABNORMAL HIGH (ref 70–99)
Glucose-Capillary: 113 mg/dL — ABNORMAL HIGH (ref 70–99)
Glucose-Capillary: 166 mg/dL — ABNORMAL HIGH (ref 70–99)
Glucose-Capillary: 134 mg/dL — ABNORMAL HIGH (ref 70–99)
Glucose-Capillary: 146 mg/dL — ABNORMAL HIGH (ref 70–99)

## 2024-05-22 LAB — GLUCOSE, CAPILLARY
Glucose-Capillary: 138 mg/dL — ABNORMAL HIGH (ref 70–99)
Glucose-Capillary: 112 mg/dL — ABNORMAL HIGH (ref 70–99)
Glucose-Capillary: 123 mg/dL — ABNORMAL HIGH (ref 70–99)
Glucose-Capillary: 144 mg/dL — ABNORMAL HIGH (ref 70–99)

## 2024-05-23 LAB — GLUCOSE, CAPILLARY
Glucose-Capillary: 139 mg/dL — ABNORMAL HIGH (ref 70–99)
Glucose-Capillary: 138 mg/dL — ABNORMAL HIGH (ref 70–99)
Glucose-Capillary: 179 mg/dL — ABNORMAL HIGH (ref 70–99)
Glucose-Capillary: 128 mg/dL — ABNORMAL HIGH (ref 70–99)

## 2024-05-24 LAB — GLUCOSE, CAPILLARY
Glucose-Capillary: 113 mg/dL — ABNORMAL HIGH (ref 70–99)
Glucose-Capillary: 167 mg/dL — ABNORMAL HIGH (ref 70–99)
Glucose-Capillary: 123 mg/dL — ABNORMAL HIGH (ref 70–99)
Glucose-Capillary: 118 mg/dL — ABNORMAL HIGH (ref 70–99)

## 2024-05-25 MED ORDER — BENZOCAINE 10 % MT GEL
Freq: Four times a day (QID) | OROMUCOSAL | Status: DC | PRN
  Administered 2024-05-25: 1 via OROMUCOSAL
  Filled 2024-05-25: qty 9

## 2024-05-25 MED ORDER — CARVEDILOL 6.25 MG PO TABS
6.2500 mg | ORAL_TABLET | Freq: Two times a day (BID) | ORAL | Status: DC
  Administered 2024-05-25 – 2024-06-05 (×21): 6.25 mg via ORAL
  Filled 2024-05-25 (×22): qty 1

## 2024-05-28 LAB — GLUCOSE, CAPILLARY
Glucose-Capillary: 118 mg/dL — ABNORMAL HIGH (ref 70–99)
Glucose-Capillary: 93 mg/dL (ref 70–99)
Glucose-Capillary: 169 mg/dL — ABNORMAL HIGH (ref 70–99)
Glucose-Capillary: 144 mg/dL — ABNORMAL HIGH (ref 70–99)
Glucose-Capillary: 120 mg/dL — ABNORMAL HIGH (ref 70–99)
Glucose-Capillary: 103 mg/dL — ABNORMAL HIGH (ref 70–99)
Glucose-Capillary: 107 mg/dL — ABNORMAL HIGH (ref 70–99)
Glucose-Capillary: 163 mg/dL — ABNORMAL HIGH (ref 70–99)
Glucose-Capillary: 93 mg/dL (ref 70–99)
Glucose-Capillary: 141 mg/dL — ABNORMAL HIGH (ref 70–99)
Glucose-Capillary: 160 mg/dL — ABNORMAL HIGH (ref 70–99)
Glucose-Capillary: 114 mg/dL — ABNORMAL HIGH (ref 70–99)
Glucose-Capillary: 133 mg/dL — ABNORMAL HIGH (ref 70–99)
Glucose-Capillary: 117 mg/dL — ABNORMAL HIGH (ref 70–99)
Glucose-Capillary: 127 mg/dL — ABNORMAL HIGH (ref 70–99)
Glucose-Capillary: 129 mg/dL — ABNORMAL HIGH (ref 70–99)

## 2024-05-29 LAB — GLUCOSE, CAPILLARY
Glucose-Capillary: 132 mg/dL — ABNORMAL HIGH (ref 70–99)
Glucose-Capillary: 86 mg/dL (ref 70–99)
Glucose-Capillary: 129 mg/dL — ABNORMAL HIGH (ref 70–99)
Glucose-Capillary: 138 mg/dL — ABNORMAL HIGH (ref 70–99)

## 2024-05-29 LAB — BASIC METABOLIC PANEL WITH GFR
Anion gap: 13 (ref 5–15)
BUN: 21 mg/dL — ABNORMAL HIGH (ref 6–20)
CO2: 27 mmol/L (ref 22–32)
Calcium: 9.6 mg/dL (ref 8.9–10.3)
Chloride: 99 mmol/L (ref 98–111)
Creatinine, Ser: 1 mg/dL (ref 0.61–1.24)
GFR, Estimated: >60 mL/min (ref >60)
Glucose, Bld: 136 mg/dL — ABNORMAL HIGH (ref 70–99)
Potassium: 3.8 mmol/L (ref 3.5–5.1)
Sodium: 139 mmol/L (ref 135–145)

## 2024-05-30 LAB — GLUCOSE, CAPILLARY
Glucose-Capillary: 116 mg/dL — ABNORMAL HIGH (ref 70–99)
Glucose-Capillary: 146 mg/dL — ABNORMAL HIGH (ref 70–99)
Glucose-Capillary: 116 mg/dL — ABNORMAL HIGH (ref 70–99)
Glucose-Capillary: 150 mg/dL — ABNORMAL HIGH (ref 70–99)

## 2024-05-31 MED ORDER — TUBERCULIN PPD 5 UNIT/0.1ML ID SOLN
5.0000 [IU] | Freq: Once | INTRADERMAL | Status: AC
  Administered 2024-05-31: 5 [IU] via INTRADERMAL
  Filled 2024-05-31 (×2): qty 0.1

## 2024-06-01 LAB — GLUCOSE, CAPILLARY
Glucose-Capillary: 177 mg/dL — ABNORMAL HIGH (ref 70–99)
Glucose-Capillary: 142 mg/dL — ABNORMAL HIGH (ref 70–99)
Glucose-Capillary: 118 mg/dL — ABNORMAL HIGH (ref 70–99)
Glucose-Capillary: 122 mg/dL — ABNORMAL HIGH (ref 70–99)
Glucose-Capillary: 132 mg/dL — ABNORMAL HIGH (ref 70–99)
Glucose-Capillary: 164 mg/dL — ABNORMAL HIGH (ref 70–99)
Glucose-Capillary: 133 mg/dL — ABNORMAL HIGH (ref 70–99)
Glucose-Capillary: 128 mg/dL — ABNORMAL HIGH (ref 70–99)

## 2024-06-02 LAB — GLUCOSE, CAPILLARY
Glucose-Capillary: 110 mg/dL — ABNORMAL HIGH (ref 70–99)
Glucose-Capillary: 156 mg/dL — ABNORMAL HIGH (ref 70–99)
Glucose-Capillary: 124 mg/dL — ABNORMAL HIGH (ref 70–99)

## 2024-06-02 LAB — BASIC METABOLIC PANEL WITH GFR
Anion gap: 11 (ref 5–15)
BUN: 21 mg/dL — ABNORMAL HIGH (ref 6–20)
CO2: 25 mmol/L (ref 22–32)
Calcium: 9.3 mg/dL (ref 8.9–10.3)
Chloride: 101 mmol/L (ref 98–111)
Creatinine, Ser: 1.05 mg/dL (ref 0.61–1.24)
GFR, Estimated: >60 mL/min (ref >60)
Glucose, Bld: 199 mg/dL — ABNORMAL HIGH (ref 70–99)
Potassium: 3.6 mmol/L (ref 3.5–5.1)
Sodium: 137 mmol/L (ref 135–145)

## 2024-06-02 LAB — CBC WITH DIFFERENTIAL/PLATELET
Abs Immature Granulocytes: 0.03 K/uL (ref 0.00–0.07)
Basophils Absolute: 0 K/uL (ref 0.0–0.1)
Basophils Relative: 0 %
Eosinophils Absolute: 0.2 K/uL (ref 0.0–0.5)
Eosinophils Relative: 4 %
HCT: 35.6 % — ABNORMAL LOW (ref 39.0–52.0)
Hemoglobin: 10.8 g/dL — ABNORMAL LOW (ref 13.0–17.0)
Immature Granulocytes: 1 %
Lymphocytes Relative: 25 %
Lymphs Abs: 1.3 K/uL (ref 0.7–4.0)
MCH: 23.9 pg — ABNORMAL LOW (ref 26.0–34.0)
MCHC: 30.3 g/dL (ref 30.0–36.0)
MCV: 78.9 fL — ABNORMAL LOW (ref 80.0–100.0)
Monocytes Absolute: 0.4 K/uL (ref 0.1–1.0)
Monocytes Relative: 7 %
Neutro Abs: 3.3 K/uL (ref 1.7–7.7)
Neutrophils Relative %: 63 %
Platelets: 274 K/uL (ref 150–400)
RBC: 4.51 MIL/uL (ref 4.22–5.81)
RDW: 17.1 % — ABNORMAL HIGH (ref 11.5–15.5)
WBC: 5.3 K/uL (ref 4.0–10.5)
nRBC: 0 % (ref 0.0–0.2)

## 2024-06-02 LAB — RETICULOCYTES
Immature Retic Fract: 30.6 % — ABNORMAL HIGH (ref 2.3–15.9)
RBC.: 4.64 MIL/uL (ref 4.22–5.81)
Retic Count, Absolute: 108.6 K/uL (ref 19.0–186.0)
Retic Ct Pct: 2.3 % (ref 0.4–3.1)

## 2024-06-02 LAB — PHOSPHORUS: Phosphorus: 3 mg/dL (ref 2.5–4.6)

## 2024-06-02 LAB — IRON AND TIBC
Iron: 50 ug/dL (ref 45–182)
Saturation Ratios: 14 % — ABNORMAL LOW (ref 17.9–39.5)
TIBC: 370 ug/dL (ref 250–450)
UIBC: 320 ug/dL

## 2024-06-02 LAB — MAGNESIUM: Magnesium: 2.1 mg/dL (ref 1.7–2.4)

## 2024-06-02 LAB — FOLATE: Folate: >40 ng/mL (ref >5.9)

## 2024-06-02 LAB — VITAMIN B12: Vitamin B-12: 997 pg/mL — ABNORMAL HIGH (ref 180–914)

## 2024-06-02 LAB — FERRITIN: Ferritin: 47 ng/mL (ref 24–336)

## 2024-06-03 LAB — SARS CORONAVIRUS 2 BY RT PCR: SARS Coronavirus 2 by RT PCR: NEGATIVE

## 2024-06-04 ENCOUNTER — Other Ambulatory Visit (HOSPITAL_COMMUNITY): Payer: Self-pay

## 2024-06-04 LAB — GLUCOSE, CAPILLARY
Glucose-Capillary: 145 mg/dL — ABNORMAL HIGH (ref 70–99)
Glucose-Capillary: 121 mg/dL — ABNORMAL HIGH (ref 70–99)
Glucose-Capillary: 250 mg/dL — ABNORMAL HIGH (ref 70–99)
Glucose-Capillary: 117 mg/dL — ABNORMAL HIGH (ref 70–99)
Glucose-Capillary: 144 mg/dL — ABNORMAL HIGH (ref 70–99)
Glucose-Capillary: 127 mg/dL — ABNORMAL HIGH (ref 70–99)

## 2024-06-04 MED ORDER — METFORMIN HCL 1000 MG PO TABS
1000.0000 mg | ORAL_TABLET | Freq: Two times a day (BID) | ORAL | 0 refills | Status: AC
  Filled 2024-06-04: qty 60, 30d supply, fill #0

## 2024-06-04 MED ORDER — BLOOD GLUCOSE MONITOR SYSTEM W/DEVICE KIT
1.0000 | PACK | Freq: Three times a day (TID) | 0 refills | Status: AC
  Filled 2024-06-04: qty 1, 30d supply, fill #0

## 2024-06-04 MED ORDER — OLANZAPINE 5 MG PO TABS
5.0000 mg | ORAL_TABLET | Freq: Every day | ORAL | 0 refills | Status: AC | PRN
  Filled 2024-06-04: qty 10, 10d supply, fill #0

## 2024-06-04 MED ORDER — CLONAZEPAM 0.5 MG PO TBDP
0.5000 mg | ORAL_TABLET | Freq: Two times a day (BID) | ORAL | 0 refills | Status: AC
  Filled 2024-06-04: qty 60, 30d supply, fill #0

## 2024-06-04 MED ORDER — ISOSORBIDE MONONITRATE ER 60 MG PO TB24
60.0000 mg | ORAL_TABLET | Freq: Every day | ORAL | 0 refills | Status: AC
  Filled 2024-06-04: qty 30, 30d supply, fill #0

## 2024-06-04 MED ORDER — ADULT MULTIVITAMIN W/MINERALS CH
1.0000 | ORAL_TABLET | Freq: Every day | ORAL | 0 refills | Status: AC
  Filled 2024-06-04: qty 30, 30d supply, fill #0

## 2024-06-04 MED ORDER — BLOOD GLUCOSE TEST VI STRP
1.0000 | ORAL_STRIP | Freq: Three times a day (TID) | 0 refills | Status: AC
  Filled 2024-06-04: qty 100, 30d supply, fill #0

## 2024-06-04 MED ORDER — "INSULIN SYRINGE-NEEDLE U-100 25G X 1"" 1 ML MISC"
0 refills | Status: AC
  Filled 2024-06-04: qty 30, fill #0

## 2024-06-04 MED ORDER — ACCU-CHEK SOFTCLIX LANCETS MISC
1.0000 | Freq: Three times a day (TID) | 0 refills | Status: AC
  Filled 2024-06-04: qty 100, 30d supply, fill #0

## 2024-06-04 MED ORDER — ROSUVASTATIN CALCIUM 20 MG PO TABS
20.0000 mg | ORAL_TABLET | Freq: Every day | ORAL | 0 refills | Status: AC
  Filled 2024-06-04: qty 30, 30d supply, fill #0

## 2024-06-04 MED ORDER — CARVEDILOL 6.25 MG PO TABS
6.2500 mg | ORAL_TABLET | Freq: Two times a day (BID) | ORAL | 0 refills | Status: AC
  Filled 2024-06-04: qty 60, 30d supply, fill #0

## 2024-06-04 MED ORDER — NICOTINE POLACRILEX 2 MG MT GUM
2.0000 mg | CHEWING_GUM | Freq: Four times a day (QID) | OROMUCOSAL | 0 refills | Status: AC | PRN
  Filled 2024-06-04: qty 30, 8d supply, fill #0

## 2024-06-04 MED ORDER — THIAMINE HCL 100 MG PO TABS
100.0000 mg | ORAL_TABLET | Freq: Every day | ORAL | 0 refills | Status: AC
  Filled 2024-06-04: qty 30, 30d supply, fill #0

## 2024-06-04 MED ORDER — FUROSEMIDE 40 MG PO TABS
40.0000 mg | ORAL_TABLET | Freq: Every day | ORAL | 0 refills | Status: DC
  Filled 2024-06-04: qty 30, 30d supply, fill #0

## 2024-06-04 MED ORDER — LANCET DEVICE MISC
1.0000 | Freq: Three times a day (TID) | 0 refills | Status: AC
  Filled 2024-06-04: qty 1, 30d supply, fill #0

## 2024-06-04 MED ORDER — INSULIN ASPART 100 UNIT/ML FLEXPEN
PEN_INJECTOR | SUBCUTANEOUS | 0 refills | Status: AC
  Filled 2024-06-04: qty 15, 30d supply, fill #0

## 2024-06-04 MED ORDER — APIXABAN 2.5 MG PO TABS
2.5000 mg | ORAL_TABLET | Freq: Two times a day (BID) | ORAL | 0 refills | Status: AC
  Filled 2024-06-04: qty 60, 30d supply, fill #0

## 2024-06-04 MED ORDER — RISPERIDONE 0.5 MG PO TABS
1.5000 mg | ORAL_TABLET | Freq: Every day | ORAL | 0 refills | Status: AC
  Filled 2024-06-04: qty 30, 10d supply, fill #0

## 2024-06-04 MED ORDER — HYDRALAZINE HCL 25 MG PO TABS
25.0000 mg | ORAL_TABLET | Freq: Three times a day (TID) | ORAL | 0 refills | Status: AC
  Filled 2024-06-04: qty 90, 30d supply, fill #0

## 2024-06-04 MED ORDER — PANTOPRAZOLE SODIUM 40 MG PO TBEC
40.0000 mg | DELAYED_RELEASE_TABLET | Freq: Every day | ORAL | 0 refills | Status: AC
  Filled 2024-06-04: qty 30, 30d supply, fill #0

## 2024-06-04 MED FILL — Folic Acid Tab 1 MG: 1.0000 mg | ORAL | 30 days supply | Qty: 30 | Fill #0 | Status: AC

## 2024-06-05 LAB — GLUCOSE, CAPILLARY: Glucose-Capillary: 125 mg/dL — ABNORMAL HIGH (ref 70–99)

## 2024-06-11 ENCOUNTER — Encounter (HOSPITAL_COMMUNITY): Payer: Self-pay | Admitting: Emergency Medicine

## 2024-07-04 ENCOUNTER — Other Ambulatory Visit: Payer: Self-pay

## 2024-07-04 ENCOUNTER — Encounter (HOSPITAL_COMMUNITY): Payer: Self-pay

## 2024-07-04 ENCOUNTER — Emergency Department (HOSPITAL_COMMUNITY)
Admission: EM | Admit: 2024-07-04 | Discharge: 2024-07-04 | Disposition: A | Discharge status: Home / Self Care | Payer: Self-pay | Attending: Emergency Medicine | Admitting: Emergency Medicine

## 2024-07-04 DIAGNOSIS — M79605 Pain in left leg: Secondary | ICD-10-CM | POA: Insufficient documentation

## 2024-07-04 DIAGNOSIS — Z794 Long term (current) use of insulin: Secondary | ICD-10-CM | POA: Insufficient documentation

## 2024-07-04 DIAGNOSIS — D72829 Elevated white blood cell count, unspecified: Secondary | ICD-10-CM | POA: Insufficient documentation

## 2024-07-04 DIAGNOSIS — M7989 Other specified soft tissue disorders: Secondary | ICD-10-CM

## 2024-07-04 DIAGNOSIS — R2243 Localized swelling, mass and lump, lower limb, bilateral: Secondary | ICD-10-CM | POA: Insufficient documentation

## 2024-07-04 DIAGNOSIS — Z7984 Long term (current) use of oral hypoglycemic drugs: Secondary | ICD-10-CM | POA: Insufficient documentation

## 2024-07-04 DIAGNOSIS — Z7901 Long term (current) use of anticoagulants: Secondary | ICD-10-CM | POA: Insufficient documentation

## 2024-07-04 DIAGNOSIS — M79604 Pain in right leg: Secondary | ICD-10-CM | POA: Insufficient documentation

## 2024-07-04 LAB — CBC WITH DIFFERENTIAL/PLATELET
Abs Immature Granulocytes: 0.02 K/uL (ref 0.00–0.07)
Basophils Absolute: 0 K/uL (ref 0.0–0.1)
Basophils Relative: 0 %
Eosinophils Absolute: 0.2 K/uL (ref 0.0–0.5)
Eosinophils Relative: 3 %
HCT: 40.2 % (ref 39.0–52.0)
Hemoglobin: 11.8 g/dL — ABNORMAL LOW (ref 13.0–17.0)
Immature Granulocytes: 0 %
Lymphocytes Relative: 34 %
Lymphs Abs: 1.9 K/uL (ref 0.7–4.0)
MCH: 23.5 pg — ABNORMAL LOW (ref 26.0–34.0)
MCHC: 29.4 g/dL — ABNORMAL LOW (ref 30.0–36.0)
MCV: 80.1 fL (ref 80.0–100.0)
Monocytes Absolute: 0.4 K/uL (ref 0.1–1.0)
Monocytes Relative: 8 %
Neutro Abs: 3.1 K/uL (ref 1.7–7.7)
Neutrophils Relative %: 55 %
Platelets: 263 K/uL (ref 150–400)
RBC: 5.02 MIL/uL (ref 4.22–5.81)
RDW: 18.3 % — ABNORMAL HIGH (ref 11.5–15.5)
WBC: 5.6 K/uL (ref 4.0–10.5)
nRBC: 0 % (ref 0.0–0.2)

## 2024-07-04 LAB — COMPREHENSIVE METABOLIC PANEL WITH GFR
ALT: 37 U/L (ref 0–44)
AST: 25 U/L (ref 15–41)
Albumin: 4.2 g/dL (ref 3.5–5.0)
Alkaline Phosphatase: 70 U/L (ref 38–126)
Anion gap: 9 (ref 5–15)
BUN: 11 mg/dL (ref 6–20)
CO2: 26 mmol/L (ref 22–32)
Calcium: 9.1 mg/dL (ref 8.9–10.3)
Chloride: 103 mmol/L (ref 98–111)
Creatinine, Ser: 1 mg/dL (ref 0.61–1.24)
GFR, Estimated: >60 mL/min (ref >60)
Glucose, Bld: 103 mg/dL — ABNORMAL HIGH (ref 70–99)
Potassium: 3.7 mmol/L (ref 3.5–5.1)
Sodium: 138 mmol/L (ref 135–145)
Total Bilirubin: 0.3 mg/dL (ref 0.0–1.2)
Total Protein: 8 g/dL (ref 6.5–8.1)

## 2024-07-04 LAB — BRAIN NATRIURETIC PEPTIDE: B Natriuretic Peptide: 19.1 pg/mL (ref 0.0–100.0)

## 2024-07-04 MED ORDER — FUROSEMIDE 40 MG PO TABS: 40.0000 mg | ORAL_TABLET | Freq: Every day | ORAL | 0 refills | Status: AC

## 2024-07-04 NOTE — ED Notes (Signed)
 Patient d/c with home care instructions. Patient provided with coffee as requested. Patient stated he needs a ride back to [employer].

## 2024-07-04 NOTE — ED Triage Notes (Signed)
 Pt. Arrives from [facility] for bilateral knee pain and bilateral leg swelling. States that today the swelling and pain is so much to where he cannot walk.

## 2024-07-04 NOTE — ED Provider Notes (Signed)
 [place] EMERGENCY DEPARTMENT AT [facility] Provider Note   CSN: [id number] Arrival date & time: 07/04/24  0154     Patient presents with: Knee Pain   Colton Lee is a 58 y.o. male presents with complaints of bilateral leg pain and swelling.  States that this has been ongoing for the past few months.  No injury or trauma.  Patient did have a recent admission where he was found in December with bilateral leg pain in the setting of hyperthermia.  He ended up having an extensive evaluation.  DVT and x-rays were unrevealing.  Patient was discharged on Lasix .  He reports noncompliance.     Knee Pain  Past Medical History:  Diagnosis Date   Foot pain    Past Surgical History:  Procedure Laterality Date   HERNIA REPAIR         Prior to Admission medications   Medication Sig Start Date End Date Taking? Authorizing Provider  Accu-Chek Softclix Lancets lancets Use in the morning, at noon, and at bedtime. May substitute to any manufacturer covered by patient's insurance. 06/04/24 07/04/24  Dennise Lavada POUR, MD  apixaban  (ELIQUIS ) 2.5 MG TABS tablet Take 1 tablet (2.5 mg total) by mouth 2 (two) times daily. 06/04/24   Singh, Prashant K, MD  Blood Glucose Monitoring Suppl (BLOOD GLUCOSE MONITOR SYSTEM) w/Device KIT Use in the morning, at noon, and at bedtime. May substitute to any manufacturer covered by patient's insurance. 06/04/24   Singh, Prashant K, MD  carvedilol  (COREG ) 6.25 MG tablet Take 1 tablet (6.25 mg total) by mouth 2 (two) times daily with a meal. 06/04/24   Dennise Lavada POUR, MD  clonazePAM  (KLONOPIN ) 0.5 MG disintegrating tablet Take 1 tablet (0.5 mg total) by mouth 2 (two) times daily. 06/04/24   Singh, Prashant K, MD  folic acid  (FOLVITE ) 1 MG tablet Take 1 tablet (1 mg total) by mouth daily. 06/04/24   Singh, Prashant K, MD  furosemide  (LASIX ) 40 MG tablet Take 1 tablet (40 mg total) by mouth daily. 07/04/24   Donnajean Lynwood DEL, PA-C  gabapentin  (NEURONTIN )  300 MG capsule Take 1 capsule (300 mg total) by mouth 2 (two) times daily. Patient not taking: Reported on 08/25/2017 09/20/16   Haze Lonni PARAS, MD  Glucose Blood (BLOOD GLUCOSE TEST STRIPS) STRP Use in the morning, at noon, and at bedtime. May substitute to any manufacturer covered by patient's insurance. 06/04/24 07/07/24  Dennise Lavada POUR, MD  hydrALAZINE  (APRESOLINE ) 25 MG tablet Take 1 tablet (25 mg total) by mouth every 8 (eight) hours. 06/04/24   Singh, Prashant K, MD  insulin  aspart (NOVOLOG ) 100 UNIT/ML FlexPen Before each meal 3 times a day, 140-199 - 2 units, 200-250 - 4 units, 251-299 - 6 units,  300-349 - 8 units,  350 or above 10 units. 06/04/24   Dennise Lavada POUR, MD  Insulin  Syringe-Needle U-100 25G X 1 1 ML MISC For 4 times a day insulin  SQ, 1 month supply. Diagnosis E11.65 06/04/24   Singh, Prashant K, MD  isosorbide  mononitrate (IMDUR ) 60 MG 24 hr tablet Take 1 tablet (60 mg total) by mouth daily. 06/04/24   Singh, Prashant K, MD  Lancet Device MISC 1 each by Does not apply route in the morning, at noon, and at bedtime. May substitute to any manufacturer covered by patient's insurance. 06/04/24 07/04/24  Dennise Lavada POUR, MD  metFORMIN  (GLUCOPHAGE ) 1000 MG tablet Take 1 tablet (1,000 mg total) by mouth 2 (two) times daily with a  meal. 06/04/24   Singh, Prashant K, MD  Multiple Vitamin (MULTIVITAMIN WITH MINERALS) TABS tablet Take 1 tablet by mouth daily. 06/04/24   Singh, Prashant K, MD  nicotine  polacrilex (NICORETTE ) 2 MG gum Take 1 each (2 mg total) by mouth every 6 (six) hours as needed for smoking cessation. 06/04/24   Singh, Prashant K, MD  OLANZapine  (ZYPREXA ) 5 MG tablet Take 1 tablet (5 mg total) by mouth daily as needed (agitation, not responding to de-escalation). 06/04/24   Singh, Prashant K, MD  pantoprazole  (PROTONIX ) 40 MG tablet Take 1 tablet (40 mg total) by mouth daily. 06/04/24   Singh, Prashant K, MD  risperiDONE  (RISPERDAL ) 0.5 MG tablet Take 3 tablets (1.5 mg  total) by mouth at bedtime. 06/04/24   Singh, Prashant K, MD  rosuvastatin  (CRESTOR ) 20 MG tablet Take 1 tablet (20 mg total) by mouth daily. 06/04/24   Singh, Prashant K, MD  thiamine  (VITAMIN B1) 100 MG tablet Take 1 tablet (100 mg total) by mouth daily. 06/04/24   Singh, Prashant K, MD  traMADol  (ULTRAM ) 50 MG tablet Take 1 tablet (50 mg total) by mouth every 6 (six) hours as needed. Patient not taking: Reported on 08/25/2017 09/20/16   Haze Lonni PARAS, MD  cyproheptadine  (PERIACTIN ) 4 MG tablet Take 1 tablet (4 mg total) by mouth 3 (three) times daily as needed (appetite). Patient not taking: Reported on 01/31/2016 11/16/15 01/25/20  Raford Lenis, MD    Allergies: Tetanus toxoid, adsorbed; Aspirin; Pork-derived products; and Tetanus toxoids    Review of Systems  Musculoskeletal:  Positive for myalgias.    Updated Vital Signs BP (!) 153/134 (BP Location: Right Arm)   Pulse 63   Temp 97.7 F (36.5 C) (Oral)   Resp 19   SpO2 100%   Physical Exam Vitals and nursing note reviewed.  Constitutional:      General: He is not in acute distress.    Appearance: He is well-developed.  HENT:     Head: Normocephalic and atraumatic.  Eyes:     Conjunctiva/sclera: Conjunctivae normal.  Cardiovascular:     Rate and Rhythm: Normal rate and regular rhythm.     Heart sounds: No murmur heard. Pulmonary:     Effort: Pulmonary effort is normal. No respiratory distress.     Breath sounds: Normal breath sounds.  Abdominal:     Palpations: Abdomen is soft.     Tenderness: There is no abdominal tenderness.  Musculoskeletal:        General: Swelling present.     Cervical back: Neck supple.     Comments: 1+ bilateral pitting edema, tolerates full range of motion of ankle and knees, no overlying erythema, warmth  Skin:    General: Skin is warm and dry.     Capillary Refill: Capillary refill takes less than 2 seconds.  Neurological:     Mental Status: He is alert.  Psychiatric:        Mood and  Affect: Mood normal.     (all labs ordered are listed, but only abnormal results are displayed) Labs Reviewed  CBC WITH DIFFERENTIAL/PLATELET - Abnormal; Notable for the following components:      Result Value   Hemoglobin 11.8 (*)    MCH 23.5 (*)    MCHC 29.4 (*)    RDW 18.3 (*)    All other components within normal limits  COMPREHENSIVE METABOLIC PANEL WITH GFR - Abnormal; Notable for the following components:   Glucose, Bld 103 (*)    All  other components within normal limits  BRAIN NATRIURETIC PEPTIDE    EKG: None  Radiology: No results found.   Procedures   Medications Ordered in the ED - No data to display                                  Medical Decision Making Amount and/or Complexity of Data Reviewed Labs: ordered.   This patient presents to the ED with chief complaint(s) of knee pain.  The complaint involves an extensive differential diagnosis and also carries with it a high risk of complications and morbidity.   Pertinent past medical history as listed in HPI  The differential diagnosis includes  Lymphedema, DVT, septic joint, fracture, dislocation, CHF Additional history obtained: Records reviewed Care Everywhere/External Records  Assessment and management:   Hemodynamically stable, nontoxic-appearing patient presenting with atraumatic bilateral leg pain and swelling over the past few months.  Is not associate with any chest pain or shortness of breath.  His exam is notable for bilateral 1+ pitting edema without any erythema or warmth.  Tolerates full range of motion of the knee and ankle without discomfort.  He is able to ambulate.  Overall do not suspect septic joint, gout or cellulitic process.  He has had ultrasounds and x-rays have been unrevealing.  Do not suspect DVT or osseous abnormality.  His BNP is without elevation and he has a recent echo with preserved EF.  Do not suspect CHF.  Overall most consistent with lymphedema.  Have recommended  compression sleeves compliance with Lasix  and follow-up with PCP.  Patient is agreeable to plan.  Do not feel that any additional imaging is indicated today.  Independent ECG interpretation:  none  Independent labs interpretation:  The following labs were independently interpreted:  CMP without significant abnormality, CBC with leukocytosis, hemoglobin stable, BMP without elevation  Independent visualization and interpretation of imaging: I independently visualized the following imaging with scope of interpretation limited to determining acute life threatening conditions related to emergency care: none    Consultations obtained:   none  Disposition:   Patient will be discharged home. The patient has been appropriately medically screened and/or stabilized in the ED. I have low suspicion for any other emergent medical condition which would require further screening, evaluation or treatment in the ED or require inpatient management. At time of discharge the patient is hemodynamically stable and in no acute distress. I have discussed work-up results and diagnosis with patient and answered all questions. Patient is agreeable with discharge plan. We discussed strict return precautions for returning to the emergency department and they verbalized understanding.     Social Determinants of Health:   Patient's impaired access to primary care  increases the complexity of managing their presentation  This note was dictated with voice recognition software.  Despite best efforts at proofreading, errors may have occurred which can change the documentation meaning.       Final diagnoses:  Leg swelling    ED Discharge Orders          Ordered    furosemide  (LASIX ) 40 MG tablet  Daily        07/04/24 0551               Donnajean Lynwood DEL, PA-C 07/04/24 9375    Long, Joshua G, MD 07/04/24 [phone]

## 2024-07-04 NOTE — Discharge Instructions (Addendum)
 You were evaluated in the emergency room for bilateral leg swelling and pain.  Your lab work did not show any significant abnormality.  A prescription for Lasix  was sent into your pharmacy.  Please take this once daily.  Additionally recommend obtaining compression sleeves in your legs daily.

## 2024-09-05 ENCOUNTER — Ambulatory Visit (HOSPITAL_BASED_OUTPATIENT_CLINIC_OR_DEPARTMENT_OTHER): Payer: Self-pay | Admitting: Family Medicine

## 2024-09-08 ENCOUNTER — Other Ambulatory Visit: Payer: Self-pay

## 2024-09-08 ENCOUNTER — Emergency Department (HOSPITAL_COMMUNITY)
Admission: EM | Admit: 2024-09-08 | Discharge: 2024-09-08 | Disposition: A | Discharge status: Home / Self Care | Payer: Self-pay | Attending: Emergency Medicine | Admitting: Emergency Medicine

## 2024-09-08 ENCOUNTER — Emergency Department (HOSPITAL_COMMUNITY): Payer: Self-pay

## 2024-09-08 DIAGNOSIS — Z794 Long term (current) use of insulin: Secondary | ICD-10-CM | POA: Insufficient documentation

## 2024-09-08 DIAGNOSIS — I10 Essential (primary) hypertension: Secondary | ICD-10-CM | POA: Insufficient documentation

## 2024-09-08 DIAGNOSIS — M25511 Pain in right shoulder: Secondary | ICD-10-CM | POA: Insufficient documentation

## 2024-09-08 DIAGNOSIS — Z5900 Homelessness unspecified: Secondary | ICD-10-CM | POA: Insufficient documentation

## 2024-09-08 DIAGNOSIS — F0390 Unspecified dementia without behavioral disturbance: Secondary | ICD-10-CM | POA: Insufficient documentation

## 2024-09-08 DIAGNOSIS — Z79899 Other long term (current) drug therapy: Secondary | ICD-10-CM | POA: Insufficient documentation

## 2024-09-08 MED ORDER — LIDOCAINE 5 % EX PTCH: 1.0000 | MEDICATED_PATCH | CUTANEOUS | 0 refills | Status: AC

## 2024-09-08 MED ORDER — ACETAMINOPHEN 500 MG PO TABS
1000.0000 mg | ORAL_TABLET | Freq: Once | ORAL | Status: AC
  Administered 2024-09-08: 1000 mg via ORAL
  Filled 2024-09-08: qty 2

## 2024-09-08 MED ORDER — METHOCARBAMOL 500 MG PO TABS: 500.0000 mg | ORAL_TABLET | Freq: Two times a day (BID) | ORAL | 0 refills | Status: AC

## 2024-09-08 NOTE — Progress Notes (Signed)
 Orthopedic Tech Progress Note Patient Details:  Colton Lee [birth year] [id number]  Ortho Devices Type of Ortho Device: Shoulder immobilizer Ortho Device/Splint Location: RUE Ortho Device/Splint Interventions: Application   Post Interventions Patient Tolerated: Well  Massie BRAVO [name] 09/08/2024, 9:44 AM

## 2024-09-08 NOTE — ED Notes (Signed)
 Patient stated he can walk and he wanted a bus pass. Patient ambulate to the bathroom.

## 2024-09-08 NOTE — ED Provider Notes (Signed)
 [place] EMERGENCY DEPARTMENT AT [facility] Provider Note   CSN: [id number] Arrival date & time: 09/08/24  9985     Patient presents with: Right Shoulder Pain    Colton Lee is a 58 y.o. male patient with past medical history of dementia, peripheral edema, hypertension, hyperlipidemia, homelessness, obesity presents from SNF facility with concern of right shoulder pain.  Patient reports that he was doing rehab exercises 2 weeks ago when this started.  He locates his pain to his right shoulder over the posterior aspect of the joint.  He denies any specific fall that started the symptoms.  He notes it has been difficult to raise his shoulder over 90 degrees of flexion.  It is tender to the touch.  He has not noticed any fever or swelling of his extremity.    HPI     Prior to Admission medications   Medication Sig Start Date End Date Taking? Authorizing Provider  lidocaine  (LIDODERM ) 5 % Place 1 patch onto the skin daily. Remove & Discard patch within 12 hours or as directed by MD 09/08/24  Yes Adolpho Meenach N, PA-C  methocarbamol (ROBAXIN) 500 MG tablet Take 1 tablet (500 mg total) by mouth 2 (two) times daily. 09/08/24  Yes Denika Krone, Warren SAILOR, PA-C  apixaban  (ELIQUIS ) 2.5 MG TABS tablet Take 1 tablet (2.5 mg total) by mouth 2 (two) times daily. 06/04/24   Singh, Prashant K, MD  Blood Glucose Monitoring Suppl (BLOOD GLUCOSE MONITOR SYSTEM) w/Device KIT Use in the morning, at noon, and at bedtime. May substitute to any manufacturer covered by patient's insurance. 06/04/24   Singh, Prashant K, MD  carvedilol  (COREG ) 6.25 MG tablet Take 1 tablet (6.25 mg total) by mouth 2 (two) times daily with a meal. 06/04/24   Dennise Lavada POUR, MD  clonazePAM  (KLONOPIN ) 0.5 MG disintegrating tablet Take 1 tablet (0.5 mg total) by mouth 2 (two) times daily. 06/04/24   Singh, Prashant K, MD  folic acid  (FOLVITE ) 1 MG tablet Take 1 tablet (1 mg total) by mouth daily. 06/04/24   Singh, Prashant  K, MD  furosemide  (LASIX ) 40 MG tablet Take 1 tablet (40 mg total) by mouth daily. 07/04/24   Donnajean Lynwood DEL, PA-C  gabapentin  (NEURONTIN ) 300 MG capsule Take 1 capsule (300 mg total) by mouth 2 (two) times daily. Patient not taking: Reported on 08/25/2017 09/20/16   Haze Lonni PARAS, MD  hydrALAZINE  (APRESOLINE ) 25 MG tablet Take 1 tablet (25 mg total) by mouth every 8 (eight) hours. 06/04/24   Singh, Prashant K, MD  insulin  aspart (NOVOLOG ) 100 UNIT/ML FlexPen Before each meal 3 times a day, 140-199 - 2 units, 200-250 - 4 units, 251-299 - 6 units,  300-349 - 8 units,  350 or above 10 units. 06/04/24   Dennise Lavada POUR, MD  Insulin  Syringe-Needle U-100 25G X 1 1 ML MISC For 4 times a day insulin  SQ, 1 month supply. Diagnosis E11.65 06/04/24   Singh, Prashant K, MD  isosorbide  mononitrate (IMDUR ) 60 MG 24 hr tablet Take 1 tablet (60 mg total) by mouth daily. 06/04/24   Singh, Prashant K, MD  metFORMIN  (GLUCOPHAGE ) 1000 MG tablet Take 1 tablet (1,000 mg total) by mouth 2 (two) times daily with a meal. 06/04/24   Singh, Prashant K, MD  Multiple Vitamin (MULTIVITAMIN WITH MINERALS) TABS tablet Take 1 tablet by mouth daily. 06/04/24   Singh, Prashant K, MD  nicotine  polacrilex (NICORETTE ) 2 MG gum Take 1 each (2 mg total) by mouth  every 6 (six) hours as needed for smoking cessation. 06/04/24   Singh, Prashant K, MD  OLANZapine  (ZYPREXA ) 5 MG tablet Take 1 tablet (5 mg total) by mouth daily as needed (agitation, not responding to de-escalation). 06/04/24   Singh, Prashant K, MD  pantoprazole  (PROTONIX ) 40 MG tablet Take 1 tablet (40 mg total) by mouth daily. 06/04/24   Singh, Prashant K, MD  risperiDONE  (RISPERDAL ) 0.5 MG tablet Take 3 tablets (1.5 mg total) by mouth at bedtime. 06/04/24   Singh, Prashant K, MD  rosuvastatin  (CRESTOR ) 20 MG tablet Take 1 tablet (20 mg total) by mouth daily. 06/04/24   Singh, Prashant K, MD  thiamine  (VITAMIN B1) 100 MG tablet Take 1 tablet (100 mg total) by mouth daily.  06/04/24   Singh, Prashant K, MD  traMADol  (ULTRAM ) 50 MG tablet Take 1 tablet (50 mg total) by mouth every 6 (six) hours as needed. Patient not taking: Reported on 08/25/2017 09/20/16   Haze Lonni PARAS, MD  cyproheptadine  (PERIACTIN ) 4 MG tablet Take 1 tablet (4 mg total) by mouth 3 (three) times daily as needed (appetite). Patient not taking: Reported on 01/31/2016 11/16/15 01/25/20  Raford Lenis, MD    Allergies: Tetanus toxoid, adsorbed; Aspirin; Porcine (pork) protein-containing drug products; and Tetanus toxoid-containing vaccines    Review of Systems  Musculoskeletal:  Positive for arthralgias.    Updated Vital Signs BP 124/77   Pulse (!) 59   Temp (!) 96.6 F (35.9 C) (Temporal)   Resp 18   SpO2 100%   Physical Exam Vitals and nursing note reviewed.  Constitutional:      General: He is not in acute distress.    Appearance: He is not toxic-appearing.  HENT:     Head: Normocephalic and atraumatic.  Eyes:     General: No scleral icterus.    Conjunctiva/sclera: Conjunctivae normal.  Cardiovascular:     Rate and Rhythm: Normal rate and regular rhythm.     Pulses: Normal pulses.     Heart sounds: Normal heart sounds.  Pulmonary:     Effort: Pulmonary effort is normal. No respiratory distress.     Breath sounds: Normal breath sounds.  Abdominal:     General: Abdomen is flat. Bowel sounds are normal.     Palpations: Abdomen is soft.     Tenderness: There is no abdominal tenderness.  Musculoskeletal:     Right lower leg: No edema.     Left lower leg: No edema.     Comments: Patient has tenderness to palpation over superior aspect of shoulder.  He has good passive range of motion.  Limited active range of motion with shoulder flexion over 90 degrees. Strong radial pulse distally.   Skin:    General: Skin is warm and dry.     Findings: No lesion.  Neurological:     General: No focal deficit present.     Mental Status: He is alert and oriented to person, place, and  time. Mental status is at baseline.     (all labs ordered are listed, but only abnormal results are displayed) Labs Reviewed - No data to display  EKG: None  Radiology: DG Shoulder Right Result Date: 09/08/2024 EXAM: 1 VIEW XRAY OF THE RIGHT SHOULDER 09/08/2024 02:24:00 AM COMPARISON: None available. CLINICAL HISTORY: pain. Rt shoulder pain FINDINGS: BONES AND JOINTS: Glenohumeral joint is normally aligned. Moderate degenerative changes of the glenohumeral joint. No definite acute displaced fracture or dislocation. 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 Moderate degenerative changes of the acromioclavicular joint. SOFT TISSUES: No abnormal calcifications. Visualized lung is unremarkable. IMPRESSION: 1. No acute displaced fracture or dislocation. 2. 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 Electronically signed by: Morgane Naveau MD 09/08/2024 02:37 AM EDT RP Workstation: [id number]     Procedures   Medications Ordered in the ED  acetaminophen  (TYLENOL ) tablet 1,000 mg (1,000 mg Oral Given 09/08/24 9071)                                    Medical Decision Making Amount and/or Complexity of Data Reviewed Radiology: ordered.  Risk OTC drugs. Prescription drug management.   This patient presents to the ED for concern of shoulder pain, this involves an extensive number of treatment options, and is a complaint that carries with it a high risk of complications and morbidity.  The differential diagnosis includes septic joint, internal derangement, fracture, sprain, contusion, DVT    Imaging Studies ordered:  I ordered imaging studies including right shoulder x-ray  I independently visualized and interpreted imaging which showed no acute findings, possible old nonunion fracture of inferior glenoid and greater tuberosity. Patient does note that he has had a previous right shoulder  fracture. I agree with the radiologist interpretation   Cardiac Monitoring: / EKG:  The patient was maintained on a cardiac monitor.     Problem List / ED Course / Critical interventions / Medication management  Patient presents to emergency room with complaint of right shoulder pain that has been ongoing for 2 weeks.  He reports this has been persistent after doing rehab exercises when he was lifting his hand above his shoulder.  He has some mild tenderness over the joint.  He does have good active range of motion.  He has no cellulitis over the area or significant swelling noted.  I have a low suspicion for septic joint at this time.  He is neurovascularly intact distally.  He has no acute findings on his x-ray.  Given he is having difficult time with active range of motion over 90 degrees question if he has muscular component to this.  Provided a sling for comfort and will have him follow-up with orthopedics.  Given Tylenol  for pain. I ordered medication including Tylenol , sling provided for comfort.  Reevaluation of the patient after these medicines showed that the patient improved I have reviewed the patients home medicines and have made adjustments as needed. Patient hemodynamically stable and well-appearing.  Feel appropriate for discharge with outpatient follow-up.        Final diagnoses:  Acute pain of right shoulder    ED Discharge Orders          Ordered    methocarbamol (ROBAXIN) 500 MG tablet  2 times daily        09/08/24 0941    lidocaine  (LIDODERM ) 5 %  Every 24 hours        09/08/24 0941               Tiney Zipper, Warren SAILOR, PA-C 09/08/24 1000    Rogelia Jerilynn RAMAN, MD 09/08/24 1134

## 2024-09-08 NOTE — ED Triage Notes (Signed)
 Patient arrived with EMS from [facility] reports persistent pain at right shoulder for 2 weeks , fell 2 weeks ago . Pain increases with movement .

## 2024-09-08 NOTE — ED Notes (Signed)
RN called [facility].  

## 2024-09-08 NOTE — ED Provider Triage Note (Signed)
 Emergency Medicine Provider Triage Evaluation Note  Colton Lee , a 58 y.o. male  was evaluated in triage.  Pt complains of right shoulder pain.  He does report falling 2 weeks ago but denies any more recent trauma/injury.  Pain worse with movement.  Review of Systems  Positive:  Negative:   Physical Exam  BP 132/68 (BP Location: Left Arm)   Pulse (!) 51   Temp 99.2 F (37.3 C) (Oral)   Resp 16   SpO2 100%  Gen:   Awake, no distress   Resp:  Normal effort  MSK:   Moves extremities without difficulty  Other:    Medical Decision Making  Medically screening exam initiated at 12:30 AM.  Appropriate orders placed.  Colton Lee was informed that the remainder of the evaluation will be completed by another provider, this initial triage assessment does not replace that evaluation, and the importance of remaining in the ED until their evaluation is complete.     Logan Ubaldo NOVAK, PA-C 09/08/24 [phone]

## 2024-09-08 NOTE — Discharge Instructions (Addendum)
 Please call the orthopedic provider to schedule follow-up.  You may require further imaging for your shoulder to rule out muscular injury.  In the meantime you can wear your sling as needed. Take Tylenol  1000mg  every 8 hours. You can use muscle relaxer but do not drive or operate heavy machinery when taking the medicine.  Use lidocaine  patch over area of pain.  Return to ED with new or worsening symptoms.
